# Patient Record
Sex: Male | Born: 1940 | Race: White | Hispanic: No | Smoking: Former smoker
Health system: Southern US, Community
[De-identification: ages and names within clinical notes are randomized; demographics above are authoritative.]

## PROBLEM LIST (undated history)

## (undated) DIAGNOSIS — D649 Anemia, unspecified: Secondary | ICD-10-CM

## (undated) DIAGNOSIS — M1990 Unspecified osteoarthritis, unspecified site: Secondary | ICD-10-CM

## (undated) DIAGNOSIS — G610 Guillain-Barre syndrome: Secondary | ICD-10-CM

## (undated) DIAGNOSIS — K219 Gastro-esophageal reflux disease without esophagitis: Secondary | ICD-10-CM

## (undated) DIAGNOSIS — I714 Abdominal aortic aneurysm, without rupture, unspecified: Secondary | ICD-10-CM

## (undated) DIAGNOSIS — C801 Malignant (primary) neoplasm, unspecified: Secondary | ICD-10-CM

## (undated) DIAGNOSIS — T4145XA Adverse effect of unspecified anesthetic, initial encounter: Secondary | ICD-10-CM

## (undated) DIAGNOSIS — T8859XA Other complications of anesthesia, initial encounter: Secondary | ICD-10-CM

## (undated) DIAGNOSIS — Z5181 Encounter for therapeutic drug level monitoring: Secondary | ICD-10-CM

## (undated) DIAGNOSIS — I080 Rheumatic disorders of both mitral and aortic valves: Secondary | ICD-10-CM

## (undated) DIAGNOSIS — I4891 Unspecified atrial fibrillation: Secondary | ICD-10-CM

## (undated) DIAGNOSIS — I4892 Unspecified atrial flutter: Principal | ICD-10-CM

## (undated) DIAGNOSIS — E119 Type 2 diabetes mellitus without complications: Secondary | ICD-10-CM

## (undated) DIAGNOSIS — Z79899 Other long term (current) drug therapy: Secondary | ICD-10-CM

## (undated) DIAGNOSIS — E785 Hyperlipidemia, unspecified: Secondary | ICD-10-CM

## (undated) HISTORY — DX: Abdominal aortic aneurysm, without rupture: I71.4

## (undated) HISTORY — DX: Abdominal aortic aneurysm, without rupture, unspecified: I71.40

## (undated) HISTORY — PX: BACK SURGERY: SHX140

## (undated) HISTORY — PX: TONSILLECTOMY: SUR1361

## (undated) HISTORY — PX: CHOLECYSTECTOMY: SHX55

## (undated) HISTORY — PX: HERNIA REPAIR: SHX51

## (undated) HISTORY — PX: PROSTATECTOMY: SHX69

## (undated) HISTORY — PX: HIP SURGERY: SHX245

---

## 2001-07-31 ENCOUNTER — Encounter (INDEPENDENT_AMBULATORY_CARE_PROVIDER_SITE_OTHER): Payer: Self-pay

## 2001-07-31 ENCOUNTER — Ambulatory Visit (HOSPITAL_COMMUNITY): Admission: RE | Admit: 2001-07-31 | Discharge: 2001-07-31 | Payer: Self-pay | Admitting: Gastroenterology

## 2002-03-16 ENCOUNTER — Ambulatory Visit (HOSPITAL_COMMUNITY): Admission: RE | Admit: 2002-03-16 | Discharge: 2002-03-16 | Payer: Self-pay | Admitting: Family Medicine

## 2002-03-16 ENCOUNTER — Encounter: Payer: Self-pay | Admitting: Family Medicine

## 2002-03-27 ENCOUNTER — Encounter: Admission: RE | Admit: 2002-03-27 | Discharge: 2002-04-24 | Payer: Self-pay | Admitting: Family Medicine

## 2002-05-08 ENCOUNTER — Encounter: Payer: Self-pay | Admitting: Orthopaedic Surgery

## 2002-05-08 ENCOUNTER — Encounter: Admission: RE | Admit: 2002-05-08 | Discharge: 2002-05-08 | Payer: Self-pay | Admitting: Orthopaedic Surgery

## 2004-07-09 ENCOUNTER — Ambulatory Visit (HOSPITAL_COMMUNITY): Admission: RE | Admit: 2004-07-09 | Discharge: 2004-07-09 | Payer: Self-pay | Admitting: Family Medicine

## 2004-08-17 ENCOUNTER — Ambulatory Visit: Payer: Self-pay | Admitting: Family Medicine

## 2004-08-19 ENCOUNTER — Ambulatory Visit (HOSPITAL_BASED_OUTPATIENT_CLINIC_OR_DEPARTMENT_OTHER): Admission: RE | Admit: 2004-08-19 | Discharge: 2004-08-19 | Payer: Self-pay | Admitting: Surgery

## 2004-10-15 ENCOUNTER — Ambulatory Visit: Payer: Self-pay | Admitting: Family Medicine

## 2004-11-13 ENCOUNTER — Ambulatory Visit: Payer: Self-pay | Admitting: Family Medicine

## 2004-12-07 ENCOUNTER — Ambulatory Visit: Payer: Self-pay | Admitting: Family Medicine

## 2005-04-29 ENCOUNTER — Ambulatory Visit: Payer: Self-pay | Admitting: Family Medicine

## 2005-06-10 ENCOUNTER — Ambulatory Visit: Payer: Self-pay | Admitting: Family Medicine

## 2005-08-17 ENCOUNTER — Ambulatory Visit: Payer: Self-pay | Admitting: Family Medicine

## 2005-10-30 ENCOUNTER — Emergency Department (HOSPITAL_COMMUNITY): Admission: EM | Admit: 2005-10-30 | Discharge: 2005-10-31 | Payer: Self-pay | Admitting: Emergency Medicine

## 2005-12-08 ENCOUNTER — Ambulatory Visit: Payer: Self-pay | Admitting: Family Medicine

## 2007-01-31 ENCOUNTER — Ambulatory Visit: Payer: Self-pay | Admitting: Family Medicine

## 2007-09-01 ENCOUNTER — Ambulatory Visit (HOSPITAL_COMMUNITY): Admission: RE | Admit: 2007-09-01 | Discharge: 2007-09-01 | Payer: Self-pay | Admitting: Urology

## 2007-10-19 ENCOUNTER — Inpatient Hospital Stay (HOSPITAL_COMMUNITY): Admission: RE | Admit: 2007-10-19 | Discharge: 2007-10-20 | Payer: Self-pay | Admitting: Urology

## 2007-10-19 ENCOUNTER — Encounter (INDEPENDENT_AMBULATORY_CARE_PROVIDER_SITE_OTHER): Payer: Self-pay | Admitting: Urology

## 2008-02-26 ENCOUNTER — Observation Stay (HOSPITAL_COMMUNITY): Admission: EM | Admit: 2008-02-26 | Discharge: 2008-02-28 | Payer: Self-pay | Admitting: Emergency Medicine

## 2008-02-26 ENCOUNTER — Ambulatory Visit: Payer: Self-pay | Admitting: Cardiology

## 2008-02-27 ENCOUNTER — Encounter: Payer: Self-pay | Admitting: Cardiology

## 2008-03-07 ENCOUNTER — Ambulatory Visit: Payer: Self-pay

## 2008-03-07 ENCOUNTER — Ambulatory Visit: Payer: Self-pay | Admitting: Internal Medicine

## 2008-03-14 ENCOUNTER — Ambulatory Visit: Payer: Self-pay | Admitting: Internal Medicine

## 2008-04-09 ENCOUNTER — Ambulatory Visit: Payer: Self-pay | Admitting: Internal Medicine

## 2008-06-07 ENCOUNTER — Ambulatory Visit: Payer: Self-pay | Admitting: Internal Medicine

## 2008-10-11 DIAGNOSIS — C801 Malignant (primary) neoplasm, unspecified: Secondary | ICD-10-CM

## 2008-10-11 HISTORY — DX: Malignant (primary) neoplasm, unspecified: C80.1

## 2008-12-18 ENCOUNTER — Encounter: Payer: Self-pay | Admitting: Physician Assistant

## 2008-12-18 ENCOUNTER — Ambulatory Visit: Payer: Self-pay | Admitting: Cardiovascular Disease

## 2008-12-18 ENCOUNTER — Encounter (INDEPENDENT_AMBULATORY_CARE_PROVIDER_SITE_OTHER): Payer: Self-pay | Admitting: *Deleted

## 2008-12-18 DIAGNOSIS — I10 Essential (primary) hypertension: Secondary | ICD-10-CM

## 2008-12-18 DIAGNOSIS — I4891 Unspecified atrial fibrillation: Secondary | ICD-10-CM

## 2008-12-18 DIAGNOSIS — E785 Hyperlipidemia, unspecified: Secondary | ICD-10-CM

## 2008-12-18 DIAGNOSIS — F17200 Nicotine dependence, unspecified, uncomplicated: Secondary | ICD-10-CM | POA: Insufficient documentation

## 2009-03-18 ENCOUNTER — Encounter: Payer: Self-pay | Admitting: Internal Medicine

## 2009-03-19 ENCOUNTER — Telehealth: Payer: Self-pay | Admitting: Internal Medicine

## 2009-04-03 ENCOUNTER — Ambulatory Visit: Payer: Self-pay | Admitting: Internal Medicine

## 2009-04-03 DIAGNOSIS — I714 Abdominal aortic aneurysm, without rupture, unspecified: Secondary | ICD-10-CM | POA: Insufficient documentation

## 2009-04-18 ENCOUNTER — Encounter: Payer: Self-pay | Admitting: Internal Medicine

## 2009-04-18 ENCOUNTER — Ambulatory Visit: Payer: Self-pay

## 2009-09-03 ENCOUNTER — Encounter: Admission: RE | Admit: 2009-09-03 | Discharge: 2009-09-03 | Payer: Self-pay | Admitting: Orthopedic Surgery

## 2009-10-28 ENCOUNTER — Ambulatory Visit: Payer: Self-pay | Admitting: Internal Medicine

## 2009-11-21 ENCOUNTER — Encounter: Payer: Self-pay | Admitting: Internal Medicine

## 2009-11-24 ENCOUNTER — Telehealth: Payer: Self-pay | Admitting: Internal Medicine

## 2009-11-26 ENCOUNTER — Ambulatory Visit: Payer: Self-pay | Admitting: Internal Medicine

## 2009-12-02 ENCOUNTER — Ambulatory Visit (HOSPITAL_BASED_OUTPATIENT_CLINIC_OR_DEPARTMENT_OTHER): Admission: RE | Admit: 2009-12-02 | Discharge: 2009-12-03 | Payer: Self-pay | Admitting: Urology

## 2009-12-09 ENCOUNTER — Encounter: Payer: Self-pay | Admitting: Internal Medicine

## 2009-12-11 ENCOUNTER — Encounter: Payer: Self-pay | Admitting: Internal Medicine

## 2010-01-08 ENCOUNTER — Ambulatory Visit: Payer: Self-pay | Admitting: Internal Medicine

## 2010-04-08 ENCOUNTER — Ambulatory Visit: Payer: Self-pay | Admitting: Internal Medicine

## 2010-06-04 ENCOUNTER — Encounter: Admission: RE | Admit: 2010-06-04 | Discharge: 2010-06-04 | Payer: Self-pay | Admitting: Neurosurgery

## 2010-10-20 ENCOUNTER — Ambulatory Visit
Admission: RE | Admit: 2010-10-20 | Discharge: 2010-10-20 | Payer: Self-pay | Source: Home / Self Care | Attending: Internal Medicine | Admitting: Internal Medicine

## 2010-10-20 ENCOUNTER — Encounter: Payer: Self-pay | Admitting: Internal Medicine

## 2010-11-01 ENCOUNTER — Encounter: Payer: Self-pay | Admitting: Family Medicine

## 2010-11-10 NOTE — Letter (Signed)
Summary: [facility]  [facility]   Imported By: Roderic Ovens 01/19/2010 14:14:05  _____________________________________________________________________  External Attachment:    Type:   Image     Comment:   External Document

## 2010-11-10 NOTE — Assessment & Plan Note (Signed)
Summary: APPT @ 3:15/ROV/PER DR Ladona Ridgel   Visit Type:  rov Primary Provider:  [facility]  CC:  afib.  History of Present Illness: Tyrone Mitchell presents today for preoperative evaluation prior to hydrocele repair.  He reports having symptoms of palpitations while painting on 11/24/09.  He feels certain that this was return of afib.  He has associated SOB but no chest pain.  His symptoms were shortlived.  He reports good exercise tolerance.  He is able to do his own yard work without SOB or CP.  He continues to have occasional afib, despite flecainide.  His episodes are more noticable at night.  He is unaware of any triggers for his afib.  He is otherwise without complaint today.  Current Medications (verified): 1)  Aspirin 81 Mg Tbec (Aspirin) .... Take One Tablet By Mouth Daily 2)  Omeprazole 20 Mg Cpdr (Omeprazole) .... One Daily 3)  Warfarin Sodium 5 Mg Tabs (Warfarin Sodium) .... Use As Directed By Anticoagulation Clinic 4)  Flecainide Acetate 100 Mg Tabs (Flecainide Acetate) .... Take One Tablet By Mouth Every 12 Hours  Allergies: 1)  ! Pcn  Past History:  Past Medical History: TOBACCO ABUSE (ICD-305.1) COUMADIN THERAPY (ICD-V58.61) HYPERTENSION, BENIGN (ICD-401.1) HYPERLIPIDEMIA-MIXED (ICD-272.4) ATRIAL FIBRILLATION (ICD-427.31) GERD PROSTATE CA S/P PROSTATECTOMY OSTEOARTHRITIS  Past Surgical History: Reviewed history from 12/18/2008 and no changes required. Prostatectomy hernia repair  Social History: Reviewed history from 12/18/2008 and no changes required. Retired  Tobacco Use - Yes. 3-4 CIGARS DAILY  Review of Systems       All systems are reviewed and negative except as listed in the HPI.   Vital Signs:  Patient profile:   70 year old male Height:      74 inches Weight:      225 pounds BMI:     28.99 Pulse rate:   67 / minute Pulse rhythm:   irregular BP sitting:   118 / 80  (left arm) Cuff size:   regular  Vitals Entered By: Danielle Rankin, CMA (November 26, 2009 3:19 PM)  Physical Exam  General:  Well developed, well nourished, in no acute distress. Head:  normocephalic and atraumatic Eyes:  PERRLA/EOM intact; conjunctiva and lids normal. Mouth:  Teeth, gums and palate normal. Oral mucosa normal. Neck:  Neck supple, no JVD. No masses, thyromegaly or abnormal cervical nodes. Lungs:  Clear bilaterally to auscultation with no wheezes, rales, or rhonchi. Heart:  RRR with normal S1 and S2.  No murmurs, rubs or gallops.  PMI was not enlarged or laterally displaced. Abdomen:  Bowel sounds positive; abdomen soft and non-tender without masses, organomegaly, or hernias noted. No hepatosplenomegaly. Msk:  Back normal, normal gait. Muscle strength and tone normal. Pulses:  pulses normal in all 4 extremities Extremities:  No clubbing or cyanosis. Neurologic:  Alert and oriented x 3. Skin:  Intact without lesions or rashes. Cervical Nodes:  no significant adenopathy Psych:  Normal affect.   Exercise Stress Test  Procedure date:  04/09/2008  Findings:      RESULTS:  This demonstrates a clinically and electrically negative   exercise treadmill test with no inducible arrhythmias in the patient on   flecainide for atrial fibrillation.         Nuclear ETT  Procedure date:  03/08/2008  Findings:      probable normal perfusion, and minimal soft tissue attenuation. No significant defect scar or ischemia. Low risk study not gated due to atrial fibrillation.    EKG  Procedure date:  11/26/2009  Findings:      sinus rhythm 67 bpm, otherwise normal ekg  Impression & Recommendations:  Problem # 1:  PREOPERATIVE EXAMINATION (ICD-V72.84) Pt presents for preoperative clearance prior to hydrocele repair.  He has no symptoms of ischemia and is able to exercise at above 10 mets.  His had a low risk myoview in 2009 and denies changes in symptoms since that time. He continues to have stable paroxysmal atrial fibrillation. I would therefore recommend  that he proceed to surgery if necessary with low risks.  Continue perioperative beta blockade.  Hold coumadin for 5 days prior to the procedure and resume coumadin immediately afterwards.  Problem # 2:  ATRIAL FIBRILLATION (ICD-427.31) stable continue flecainide and coumadin if worsening afib, we could consider ablation  His updated medication list for this problem includes:    Aspirin 81 Mg Tbec (Aspirin) .Marland Kitchen... Take one tablet by mouth daily    Warfarin Sodium 5 Mg Tabs (Warfarin sodium) ..... Use as directed by anticoagulation clinic    Flecainide Acetate 100 Mg Tabs (Flecainide acetate) .Marland Kitchen... Take one tablet by mouth every 12 hours  Patient Instructions: 1)  follow-up with Dr Ladona Ridgel as previously scheduled.

## 2010-11-10 NOTE — Assessment & Plan Note (Signed)
Summary: 3-4 [email or web address]   Primary Provider:  [facility]   History of Present Illness: Tyrone Mitchell returns today for followup of atrial fibrillation and HTN.  He has a h/o recurrent atrial fib and has for the most part maintained NSR on flecainide.  The patient has done well until I saw him several months ago when he noted increasing episodes of atrial fib which occured most frequently in the evening.  At that time I recommended that he taking an additional flecainide on a as needed basis and he notes that since then, he has maintained NSR very nicely.  The patient notes that he has had to take an additional flecainide only 3-4 times in the past 3 months.   Current Medications (verified): 1)  Aspirin 81 Mg Tbec (Aspirin) .... Take One Tablet By Mouth Daily 2)  Omeprazole 20 Mg Cpdr (Omeprazole) .... One Daily 3)  Warfarin Sodium 5 Mg Tabs (Warfarin Sodium) .... Use As Directed By Anticoagulation Clinic 5 Mg Sat, Sun, Tues and Wed.  Other Days Take 2.5 Mg 4)  Flecainide Acetate 100 Mg Tabs (Flecainide Acetate) .... Take One Tablet By Mouth Every 12 Hours 5)  Celebrex 200 Mg Caps (Celecoxib) .... Take One Capsule Once Daily  Allergies (verified): 1)  ! Pcn  Past History:  Past Medical History: Last updated: 11/26/2009 TOBACCO ABUSE (ICD-305.1) COUMADIN THERAPY (ICD-V58.61) HYPERTENSION, BENIGN (ICD-401.1) HYPERLIPIDEMIA-MIXED (ICD-272.4) ATRIAL FIBRILLATION (ICD-427.31) GERD PROSTATE CA S/P PROSTATECTOMY OSTEOARTHRITIS  Past Surgical History: Last updated: 2009-01-09 Prostatectomy hernia repair  Family History: Last updated: 2009-01-09 Father:DIED CA BUT HAD ANGINA Mother:DIED 89 DEMENTIA BROTHER CAD  Social History: Last updated: 01-09-2009 Retired  Tobacco Use - Yes. 3-4 CIGARS DAILY  Review of Systems  The patient denies chest pain, syncope, dyspnea on exertion, and peripheral edema.    Vital Signs:  Patient profile:   70 year old male Height:      74  inches Weight:      217 pounds BMI:     27.96 Pulse rate:   69 / minute Pulse rhythm:   regular BP sitting:   114 / 66  (left arm) Cuff size:   large  Vitals Entered By: Judithe Modest CMA (April 08, 2010 2:11 PM)  Physical Exam  General:  Well developed, well nourished, in no acute distress. Head:  normocephalic and atraumatic Eyes:  PERRLA/EOM intact; conjunctiva and lids normal. Mouth:  Teeth, gums and palate normal. Oral mucosa normal. Neck:  Neck supple, no JVD. No masses, thyromegaly or abnormal cervical nodes. Lungs:  Clear bilaterally to auscultation with no wheezes, rales, or rhonchi. Heart:  RRR with normal S1 and S2.  No murmurs, rubs or gallops.  PMI was not enlarged or laterally displaced. Abdomen:  Bowel sounds positive; abdomen soft and non-tender without masses, organomegaly, or hernias noted. No hepatosplenomegaly. Msk:  Back normal, normal gait. Muscle strength and tone normal. Pulses:  pulses normal in all 4 extremities Extremities:  No clubbing or cyanosis. Neurologic:  Alert and oriented x 3.   EKG  Procedure date:  04/08/2010  Findings:      Normal sinus rhythm with rate of: 69. PAC's noted.    Impression & Recommendations:  Problem # 1:  ATRIAL FIBRILLATION (ICD-427.31) His symptoms remain well controlled on medical therapy.  Will continue. His updated medication list for this problem includes:    Aspirin 81 Mg Tbec (Aspirin) .Marland Kitchen... Take one tablet by mouth daily    Warfarin Sodium 5 Mg Tabs (Warfarin sodium) ..... Use as  directed by anticoagulation clinic 5 mg sat, sun, tues and wed.  other days take 2.5 mg    Flecainide Acetate 100 Mg Tabs (Flecainide acetate) .Marland Kitchen... Take one tablet by mouth every 12 hours  Orders: EKG w/ Interpretation (93000)  Problem # 2:  TOBACCO ABUSE (ICD-305.1) I continue to encourage him on smoking cessation.  He will try to improve.  Problem # 3:  HYPERTENSION, BENIGN (ICD-401.1) His blood pressure remains well  controlled.  Will followup and he will maintain a low sodium diet. His updated medication list for this problem includes:    Aspirin 81 Mg Tbec (Aspirin) .Marland Kitchen... Take one tablet by mouth daily  Patient Instructions: 1)  Your physician wants you to follow-up in: 6 months with Dr Ladona Ridgel.  You will receive a reminder letter in the mail two months in advance. If you don't receive a letter, please call our office to schedule the follow-up appointment. 2)  Your physician recommends that you continue on your current medications as directed. Please refer to the Current Medication list given to you today.

## 2010-11-10 NOTE — Progress Notes (Signed)
Summary: palps. needs clearance  Phone Note From Other Clinic   Caller: nurse tammy Summary of Call: Pt scheduled to have a procedure on the 22nd. Hydrocelerepair. Pt came into their ofc on Friday c/o chest pain. Wants pt seen before 22nd. Dr Ladona Ridgel doesnt have anything. ofc [id number] Initial call taken by: Edman Circle,  November 24, 2009 9:07 AM  Follow-up for Phone Call        tried to call Tammy  the office is experiencing high call vol and and I could not continue to hold. will try back later Dennis Bast, RN, BSN  November 24, 2009 11:39 AM tried to call office again ...no answer.. phone rang over and over no voice mail with option 4. Dennis Bast, RN, BSN  November 24, 2009 12:35 PM finally was able to get through to office and speek to [place].  Let her know that Dr Ladona Ridgel does not have any openings before the 22nd. Per Dr Ladona Ridgel will have to be seen by another one of our Cardiologist.  She spoke with Dr Lysbeth Galas and he is ok with that. we will call pt and schedule appointment. Dennis Bast, RN, BSN  November 24, 2009 2:34 PM

## 2010-11-10 NOTE — Assessment & Plan Note (Signed)
Summary: rov/afib   Visit Type:  Initial Consult Primary Provider:  [facility]   History of Present Illness: [name] returns today for followup of atrial fibrillation and HTN.  He has a h/o recurrent atrial fib and has for the most part maintained NSR on flecainide.  The patient has done well until several weeks ago when he noted increasing episodes of atrial fib which occured most frequently in the evening.  He has subsequently undergone repair of a hydrocele.    Current Medications (verified): 1)  Aspirin 81 Mg Tbec (Aspirin) .... Take One Tablet By Mouth Daily 2)  Omeprazole 20 Mg Cpdr (Omeprazole) .... One Daily 3)  Warfarin Sodium 5 Mg Tabs (Warfarin Sodium) .... Use As Directed By Anticoagulation Clinic 4)  Flecainide Acetate 100 Mg Tabs (Flecainide Acetate) .... Take One Tablet By Mouth Every 12 Hours 5)  Glucosamine Sulfate   Powd (Glucosamine Sulfate) .... Once Daily  Allergies: 1)  ! Pcn  Past History:  Past Medical History: Last updated: 11/26/2009 TOBACCO ABUSE (ICD-305.1) COUMADIN THERAPY (ICD-V58.61) HYPERTENSION, BENIGN (ICD-401.1) HYPERLIPIDEMIA-MIXED (ICD-272.4) ATRIAL FIBRILLATION (ICD-427.31) GERD PROSTATE CA S/P PROSTATECTOMY OSTEOARTHRITIS  Past Surgical History: Last updated: 12/18/2008 Prostatectomy hernia repair  Review of Systems  The patient denies chest pain, syncope, dyspnea on exertion, and peripheral edema.    Vital Signs:  Patient profile:   70 year old male Height:      74 inches Weight:      223 pounds BMI:     28.73 Pulse rate:   62 / minute BP sitting:   110 / 70  (left arm)  Vitals Entered By: Laurance Flatten CMA (January 08, 2010 11:59 AM)  Physical Exam  General:  Well developed, well nourished, in no acute distress. Head:  normocephalic and atraumatic Eyes:  PERRLA/EOM intact; conjunctiva and lids normal. Mouth:  Teeth, gums and palate normal. Oral mucosa normal. Neck:  Neck supple, no JVD. No masses, thyromegaly or abnormal  cervical nodes. Lungs:  Clear bilaterally to auscultation with no wheezes, rales, or rhonchi. Heart:  RRR with normal S1 and S2.  No murmurs, rubs or gallops.  PMI was not enlarged or laterally displaced. Abdomen:  Bowel sounds positive; abdomen soft and non-tender without masses, organomegaly, or hernias noted. No hepatosplenomegaly. Msk:  Back normal, normal gait. Muscle strength and tone normal. Pulses:  pulses normal in all 4 extremities Extremities:  No clubbing or cyanosis. Neurologic:  Alert and oriented x 3.   EKG  Procedure date:  01/08/2010  Findings:      Normal sinus rhythm with rate of: 62. PAC's noted.    Impression & Recommendations:  Problem # 1:  ATRIAL FIBRILLATION (ICD-427.31) He seems to have more atrial fibrillation and I have asked him to take additional flecainide on days that his symptoms are worse.  He may also require as needed beta blockers. His updated medication list for this problem includes:    Aspirin 81 Mg Tbec (Aspirin) .Marland Kitchen... Take one tablet by mouth daily    Warfarin Sodium 5 Mg Tabs (Warfarin sodium) ..... Use as directed by anticoagulation clinic    Flecainide Acetate 100 Mg Tabs (Flecainide acetate) .Marland Kitchen... Take one tablet by mouth every 12 hours  Orders: EKG w/ Interpretation (93000)  Problem # 2:  HYPERTENSION, BENIGN (ICD-401.1) His blood pressure has been well controlled on dietary management.  A low sodium diet is requested. His updated medication list for this problem includes:    Aspirin 81 Mg Tbec (Aspirin) .Marland Kitchen... Take one tablet  by mouth daily  Patient Instructions: 1)  Your physician recommends that you schedule a follow-up appointment in: 3-4 months with Dr Ladona Ridgel

## 2010-11-10 NOTE — Consult Note (Signed)
[facility]   Imported By: Marylou Mccoy 11/25/2009 10:29:33  _____________________________________________________________________  External Attachment:    Type:   Image     Comment:   External Document

## 2010-11-10 NOTE — Assessment & Plan Note (Signed)
Summary: per check out   Visit Type:  Follow-up Primary Provider:  [facility]   History of Present Illness: [name] refers today for followup.  He is a 70 yo man with a h/o atrial fibrillation who was placed on flecainide.  He has maintained NSR since.  He returns today and notes that he feels well.  He denies c/p or sob and his energy level has returned to normal.  No syncope or abdominal pain.  He has been intolerant of beta blockers experiencing bradycardia and hypotension.  Current Medications (verified): 1)  Aspirin 81 Mg Tbec (Aspirin) .... Take One Tablet By Mouth Daily 2)  Omeprazole 20 Mg Cpdr (Omeprazole) .... One Daily 3)  Warfarin Sodium 5 Mg Tabs (Warfarin Sodium) .... Use As Directed By Anticoagulation Clinic 4)  Flecainide Acetate 100 Mg Tabs (Flecainide Acetate) .... Take One Tablet By Mouth Every 12 Hours  Allergies: 1)  ! Pcn  Past History:  Past Medical History: Last updated: 12/18/2008 Current Problems:  TOBACCO ABUSE (ICD-305.1) COUMADIN THERAPY (ICD-V58.61) HYPERTENSION, BENIGN (ICD-401.1) HYPERLIPIDEMIA-MIXED (ICD-272.4) ATRIAL FIBRILLATION (ICD-427.31) GERD PROSTATE CA S/P PROSTATECTOMY OSTEOARTHRITIS  Past Surgical History: Last updated: 12/18/2008 Prostatectomy hernia repair  Review of Systems  The patient denies chest pain, syncope, dyspnea on exertion, and peripheral edema.    Vital Signs:  Patient profile:   70 year old male Height:      74 inches Weight:      227 pounds BMI:     29.25 Pulse rate:   70 / minute BP sitting:   130 / 80  (left arm)  Vitals Entered By: Laurance Flatten CMA (October 28, 2009 12:37 PM)  Physical Exam  General:  Well developed, well nourished, in no acute distress. Head:  normocephalic and atraumatic Eyes:  PERRLA/EOM intact; conjunctiva and lids normal. Mouth:  Teeth, gums and palate normal. Oral mucosa normal. Neck:  Neck supple, no JVD. No masses, thyromegaly or abnormal cervical nodes. Lungs:  Clear  bilaterally to auscultation with no wheezes, rales, or rhonchi. Heart:  RRR with normal S1 and S2.  No murmurs, rubs or gallops.  PMI was not enlarged or laterally displaced. Abdomen:  Bowel sounds positive; abdomen soft and non-tender without masses, organomegaly, or hernias noted. No hepatosplenomegaly.  I could not appreciate an aneurysm by palpitation. Msk:  Back normal, normal gait. Muscle strength and tone normal. Pulses:  pulses normal in all 4 extremities Extremities:  No clubbing or cyanosis. Neurologic:  Alert and oriented x 3.   EKG  Procedure date:  10/28/2009  Findings:      Normal sinus rhythm with rate of:  70.  Impression & Recommendations:  Problem # 1:  ATRIAL FIBRILLATION (ICD-427.31) His atrial fibrillation remains nicely controlled on flecainide.  His ECG shows a normal QRS.  Will recheck in one year. His updated medication list for this problem includes:    Aspirin 81 Mg Tbec (Aspirin) .Marland Kitchen... Take one tablet by mouth daily    Warfarin Sodium 5 Mg Tabs (Warfarin sodium) ..... Use as directed by anticoagulation clinic    Flecainide Acetate 100 Mg Tabs (Flecainide acetate) .Marland Kitchen... Take one tablet by mouth every 12 hours  Orders: EKG w/ Interpretation (93000)  Problem # 2:  HYPERTENSION, BENIGN (ICD-401.1) He remains normotensive on no medical therapy and was hypotensive on beta blockers. His updated medication list for this problem includes:    Aspirin 81 Mg Tbec (Aspirin) .Marland Kitchen... Take one tablet by mouth daily  Patient Instructions: 1)  Your physician wants  you to follow-up in: 1 year.You will receive a reminder letter in the mail two months in advance. If you don't receive a letter, please call our office to schedule the follow-up appointment.

## 2010-11-12 NOTE — Assessment & Plan Note (Signed)
Summary: f1y   Visit Type:  Follow-up Primary Provider:  [facility]   History of Present Illness: Tyrone Mitchell returns today for followup of atrial fibrillation.  He has a h/o recurrent atrial fib and has for the most part maintained NSR on flecainide.  The patient has done well from an arrhythmia standpoint.  He has recently undergone back surgery.  He thinks he has had only one prolonged episode of back pain in the past 3 months.  Current Medications (verified): 1)  Aspirin 81 Mg Tbec (Aspirin) .... Take One Tablet By Mouth Daily 2)  Omeprazole 20 Mg Cpdr (Omeprazole) .... One Daily 3)  Warfarin Sodium 5 Mg Tabs (Warfarin Sodium) .... Use As Directed By Anticoagulation Clinic 5 Mg Sat, Sun, Tues and Wed.  Other Days Take 2.5 Mg 4)  Flecainide Acetate 100 Mg Tabs (Flecainide Acetate) .... Take One Tablet By Mouth Every 12 Hours 5)  Percocet .... Uad  Allergies (verified): 1)  ! Pcn  Past History:  Past Medical History: Last updated: 11/26/2009 TOBACCO ABUSE (ICD-305.1) COUMADIN THERAPY (ICD-V58.61) HYPERTENSION, BENIGN (ICD-401.1) HYPERLIPIDEMIA-MIXED (ICD-272.4) ATRIAL FIBRILLATION (ICD-427.31) GERD PROSTATE CA S/P PROSTATECTOMY OSTEOARTHRITIS  Past Surgical History: Last updated: 12/18/2008 Prostatectomy hernia repair  Review of Systems  The patient denies chest pain, syncope, dyspnea on exertion, and peripheral edema.    Vital Signs:  Patient profile:   70 year old male Height:      74 inches Weight:      214 pounds BMI:     27.58 Pulse rate:   70 / minute BP sitting:   110 / 70  (left arm)  Vitals Entered By: Laurance Flatten CMA (October 20, 2010 9:27 AM)  Physical Exam  General:  Well developed, well nourished, in no acute distress. Head:  normocephalic and atraumatic Eyes:  PERRLA/EOM intact; conjunctiva and lids normal. Mouth:  Teeth, gums and palate normal. Oral mucosa normal. Neck:  Neck supple, no JVD. No masses, thyromegaly or abnormal cervical  nodes. Lungs:  Clear bilaterally to auscultation with no wheezes, rales, or rhonchi. Heart:  RRR with normal S1 and S2.  No murmurs, rubs or gallops.  PMI was not enlarged or laterally displaced. Abdomen:  Bowel sounds positive; abdomen soft and non-tender without masses, organomegaly, or hernias noted. No hepatosplenomegaly. Msk:  Back normal, normal gait. Muscle strength and tone normal. Pulses:  pulses normal in all 4 extremities Extremities:  No clubbing or cyanosis. Neurologic:  Alert and oriented x 3.   EKG  Procedure date:  10/20/2010  Findings:      Normal sinus rhythm with rate of:  70.  Impression & Recommendations:  Problem # 1:  ATRIAL FIBRILLATION (ICD-427.31) His symptoms are well controlled. He will continue his current meds. His updated medication list for this problem includes:    Aspirin 81 Mg Tbec (Aspirin) .Marland Kitchen... Take one tablet by mouth daily    Warfarin Sodium 5 Mg Tabs (Warfarin sodium) ..... Use as directed by anticoagulation clinic 5 mg sat, sun, tues and wed.  other days take 2.5 mg    Flecainide Acetate 100 Mg Tabs (Flecainide acetate) .Marland Kitchen... Take one tablet by mouth every 12 hours  Patient Instructions: 1)  Your physician wants you to follow-up in: 12months with Dr Court Joy will receive a reminder letter in the mail two months in advance. If you don't receive a letter, please call our office to schedule the follow-up appointment.

## 2010-12-30 LAB — APTT: aPTT: 26 seconds (ref 24–37)

## 2010-12-30 LAB — POCT HEMOGLOBIN-HEMACUE: Hemoglobin: 14.9 g/dL (ref 13.0–17.0)

## 2011-01-27 ENCOUNTER — Other Ambulatory Visit: Payer: Self-pay | Admitting: Internal Medicine

## 2011-01-28 ENCOUNTER — Other Ambulatory Visit: Payer: Self-pay | Admitting: *Deleted

## 2011-01-28 MED ORDER — FLECAINIDE ACETATE 100 MG PO TABS: 100.0000 mg | ORAL_TABLET | Freq: Two times a day (BID) | ORAL | Status: DC

## 2011-02-23 NOTE — H&P (Signed)
NAME:  Tyrone Mitchell, Tyrone Mitchell NO.:  [id number]   MEDICAL RECORD NO.:  [id number]          PATIENT TYPE:  OBV   LOCATION:  1424                         FACILITY:  [facility]   PHYSICIAN:  Gerrit Friends. Dietrich Pates, MD, FACCDATE OF BIRTH:  June 25, 1941   DATE OF ADMISSION:  02/26/2008  DATE OF DISCHARGE:                              HISTORY & PHYSICAL   PRIMARY CARE PHYSICIAN:  Delaney Meigs, MD.   PRIMARY CARDIOLOGIST:  Will be Dr. Doylene Canning. Ladona Ridgel, has an appointment,  has not yet seen.   CHIEF COMPLAINT:  Palpitations and shortness of breath.   HISTORY OF PRESENT ILLNESS:  Mr. Kuzniar is a 70 year old male with no  history of coronary artery disease.  He has had palpitations that have  increased in frequency over the last 4 weeks.  He has also noted dyspnea  on exertion and an occasional right shooting chest pain.  The chest pain  is very brief.  He denies edema but has had orthopnea.  He has not had  PND.  His palpitations are worse at night and first thing in the  morning.  After he takes his a.m. medications, his symptoms improve by  about lunchtime.  Today, his symptoms were worse and his weakness was  worse.  He also felt like his dyspnea was worse.  He came to the  hospital and he was found to be in atrial fibrillation with some volume  overload.  His heart rate is currently controlled and he is resting  comfortably on O2.   PAST MEDICAL HISTORY:  1. Paroxysmal atrial fibrillation diagnosed 5 years ago in [place],      [place], at [facility].  2. Hyperlipidemia.  3. Tobacco use.  4. Chronic anticoagulation with Coumadin.  5. History of prostate cancer with no atrial fibrillation at the time      of his prostatectomy.  6. Osteoarthritis.  7. Gastroesophageal reflux.   PAST SURGICAL HISTORY:  1. Status post prostatectomy in January of 2009.  2. Remote hernia repair.   ALLERGIES:  PENICILLIN.   CURRENT MEDICATIONS:  1. Aspirin 81 mg a day.  2. Atenolol 25 mg a  day.  3. Diltiazem 90 mg a day.  4. Lorazepam 0.5 mg p.r.n.  5. Naprosyn 500 mg b.i.d.  6. Niacin 1000 mg a day.  7. Prilosec 20 mg a day.  8. Ditropan 5 mg a day.  9. Coumadin 5 mg daily except for 2.5 mg on Saturday.   SOCIAL HISTORY:  He lives in [place] alone and is retired from [employer].  He used to smoke cigarettes and smokes 3 to 4 small cigars  daily.  He denies alcohol or drug abuse.   FAMILY HISTORY:  His mother died at age 72 and his father died at age  71.  His mother died with dementia, but no heart disease, and his father  died of cancer but had a history of angina.  He has 1 brother with no  CAD.   REVIEW OF SYSTEMS:  He has some mild hearing and vision loss.  The  shortness of breath and  chest pain are described above.  He has  occasional wheezing.  He has had some anxiety because of the cancer.  He  has some problems with frequent urination and nocturia.  He has some  chronic arthralgias and reflux symptoms, but no hematemesis or melena.  A full 14-point review of systems is otherwise negative.   PHYSICAL EXAMINATION:  VITAL SIGNS:  Blood pressure 117/80, heart rate  66, respiratory rate 12, O2 saturation 97% on 3 L.  GENERAL:  He is a well-developed, well-nourished, white male in no acute  distress.  HEENT:  Normal.  NECK:  There is no lymphadenopathy, thyromegaly, or bruit noted.  He has  mild JVD.  CV:  His heart is irregular in rate and rhythm with an S1 S2, and a  faint systolic murmur is noted.  Distal pulses are intact in all 4  extremities.  LUNGS:  He has decreased breath sounds in the bases with a few rales,  but no crackles are noted.  SKIN:  No rashes or lesions are noted.  ABDOMEN:  Soft and nontender with active bowel sounds.  EXTREMITIES:  There is no cyanosis, clubbing, or edema noted.  MUSCULOSKELETAL:  There is no joint deformity or effusions, and no spine  or CVA tenderness.  NEURO:  He is alert and oriented.  Cranial nerves II-XII  grossly intact.   CHEST X-RAY:  Mild interstitial pulmonary edema is noted with a small  right effusion.  A prior chest x-ray in 2006 showed increased markings,  which are now slightly worse with Charyl Dancer B lines noted.   ELECTROCARDIOGRAM:  Atrial fibrillation, rate 73, with no acute ischemic  changes and possibly early repo.   Laboratory values are pending, but INR is 2.3 and point-of-care markers  are negative x1, BNP 210.   IMPRESSION:  Atrial fibrillation.  He needs better rate control as his  palpitations are more rapid and more noticeable in the evenings and  first thing in the morning before he takes his medicines.  We will  increase his Cardizem to 30 mg p.o. q.6 hours and change to Cardizem-CD  at 120 in a.m. if he tolerates it well.  We will also increase his  atenolol to 25 mg p.o. b.i.d.  We will check an echocardiogram as well  as a TSH.  A D-dimer will be checked as well.  His Coumadin is  therapeutic and will be continued.  He will be continued on his other  home  medications with a lipid profile and a hemoglobin A1c check as well.  He  received intravenous Lasix in the emergency room and diuresed well with  this.  We will switch him to p.o. Lasix in a.m. and follow his volume  status closely.  Further evaluation and treatment will depend on the  results of the above testing.      Theodore Demark, PA-C      Gerrit Friends. Dietrich Pates, MD, [facility]  Electronically Signed    RB/MEDQ  D:  02/26/2008  T:  02/26/2008  Job:  [id number]   cc:   Delaney Meigs, M.D.  Fax: [phone]

## 2011-02-23 NOTE — Procedures (Signed)
[facility]                              EXERCISE TREADMILL   NAME:Tyrone Mitchell, Tyrone Mitchell                    MRN:          [id number]  DATE:04/09/2008                            DOB:          12-24-1940    The patient is a very pleasant 70 year old male with atrial fibrillation  who was placed on flecainide.  He is here now for exercise treadmill  test to rule out for arrhythmia on flecainide.   PROCEDURE:  After informed consent was obtained, the patient was taken  to the treadmill area where he was prepped in the usual manner.  His  initial rhythm was sinus bradycardia at 49 beats per minute.  He walked  on a Bruce protocol for a total of 7 minutes reaching 8-1/2 METs.  His  heart rate increased up to a maximum of 116 beats per minute, which  represented approximately 90% of predicted.  The test was stopped  secondary to patient fatigue.  There was no chest pain or shortness of  breath.  Review of the EKG segments demonstrated no diagnostic ST-T  changes for ischemia.  In recovery, there were no significant  arrhythmias though he did have occasional PACs.   COMPLICATIONS:  There were no immediate procedure complications.   RESULTS:  This demonstrates a clinically and electrically negative  exercise treadmill test with no inducible arrhythmias in the patient on  flecainide for atrial fibrillation.     Doylene Canning. Ladona Ridgel, MD  Electronically Signed    GWT/MedQ  DD: 04/09/2008  DT: 04/10/2008  Job #: [id number]   cc:   Delaney Meigs, M.D.

## 2011-02-23 NOTE — Assessment & Plan Note (Signed)
[facility]                         ELECTROPHYSIOLOGY OFFICE NOTE   NAME:Tyrone Mitchell, Tyrone Mitchell                    MRN:          [id number]  DATE:06/07/2008                            DOB:          [birth year]    Tyrone Mitchell returns today for followup.  I saw him back in May 2009, for  atrial fibrillation, and when he was in our office, he was actually in  AFib.  He had been placed on beta-blockers and calcium channel blockers,  but despite these as well as digoxin, he maintained AFib.  We  subsequently started him on flecainide and discontinued his Cardizem,  and he spontaneously reverted back to sinus rhythm.  He returns today  for followup.  The patient has otherwise been stable.  He denies chest  pain.  He denies palpitations.  He denies shortness of breath.  He is  tolerating his flecainide very nicely.   MEDICATIONS:  1. Niacin 500 two tablets daily.  2. Aspirin 81 mg daily.  3. Omeprazole 20 mg daily.  4. Warfarin as directed.  5. Naprosyn p.r.n.  6. Potassium 20 mEq daily.  7. Digoxin 0.125 mg daily.  8. Flecainide 100 mg twice daily.  9. Atenolol 25 twice daily.  10.Furosemide 20 mg p.r.n. daily.   PHYSICAL EXAMINATION:  GENERAL:  He is a pleasant well-appearing, middle-  aged man in no distress.  VITAL SIGNS:  Blood pressure was 110/80, the pulse 66 and regular,  respirations were 18, the weight was 221 pounds.  NECK:  No jugular venous distention.  LUNGS:  Clear bilaterally to auscultation.  No wheezes, rales, or  rhonchi are present.  CARDIOVASCULAR:  Regular rate and rhythm.  Normal S1 and S2.  EXTREMITIES:  No edema.   IMPRESSION:  1. Paroxysmal/persistent atrial fibrillation, now maintaining normal      sinus rhythm on flecainide.  2. Hypertension, his blood pressure is well controlled.   DISCUSSION:  Tyrone Mitchell is stable.  His AFib is maintained.  He has  been maintaining sinus rhythm very nicely on flecainide.  I will plan  to  see the patient back in the office in several months.     Doylene Canning. Ladona Ridgel, MD  Electronically Signed    GWT/MedQ  DD: 06/07/2008  DT: 06/08/2008  Job #: [id number]   cc:   Delaney Meigs, M.D.

## 2011-02-23 NOTE — Discharge Summary (Signed)
NAME:  Tyrone Mitchell, Tyrone Mitchell NO.:  [id number]   MEDICAL RECORD NO.:  [id number]          PATIENT TYPE:  OBV   LOCATION:  1424                         FACILITY:  [facility]   PHYSICIAN:  Rollene Rotunda, MD, FACCDATE OF BIRTH:  1940-12-15   DATE OF ADMISSION:  02/26/2008  DATE OF DISCHARGE:  02/28/2008                               DISCHARGE SUMMARY   REASON FOR ADMISSION:  Palpitations, shortness of breath.   DISCHARGE DIAGNOSES:  1. Atrial fibrillation.  2. Congestive heart failure a well-preserved ejection fraction.   PROCEDURE:  Echocardiogram.   HISTORY OF PRESENT ILLNESS:  The patient was admitted on 02/26/2008,  with increasing palpitations.  He had, had a history of paroxysmal  atrial fibrillation.  He came to the emergency room when the  palpitations became more symptomatic.  He was also somewhat short of  breath.  His chest x-ray demonstrated some mild pulmonary edema.  His  atrial fibrillation rate was in the 130s.  His labs were unremarkable,  except for a slightly elevated BNP of 210 and an INR 2.3.  Point care  markers were negative.   The patient was admitted to the hospital and treated with an increased  dose of oral Cardizem and atenolol.  Digoxin was also added to his  regimen.  An echocardiogram demonstrated an ejection fraction of 55%.  There were no wall motion abnormalities.  There was mild to moderate  mitral regurgitation.  The left atrial size with moderately dilated.  The patient did rule out for myocardial infarction.  His breathing was  improved with Lasix.  He maintained a therapeutic Coumadin level.  His  TSH was normal.  D-dimer was less than 0.22.   On the day of discharge, the patient's heart rate on telemetry was in  the 90s, and still in atrial fibrillation.  He was not having further  palpitations.  His breathing was much improved.  He had complained of  some intermittent chest discomfort before admission, but was having none  of this  at the time of discharge.  This was usually when he was having  his most rapid rate.   LABORATORY DATA:  PT (at discharge) 2.6, sodium 139, potassium 3.9, BUN  16, creatinine 1.2, TSH 0.943, hemoglobin A1c 6.1, total cholesterol  186, triglycerides 176, LDL 123, HDL 28, BNP 210.  Chest x-ray (Feb 27, 2008), stable, mild congestive heart failure.   DISCHARGE MEDICATIONS:  1. Diltiazem CD 120 mg daily (increased and consolidated).  2. Atenolol 25 mg b.i.d. (dose increased).  3. Lasix 20 mg daily (new drug).  4. Potassium 10 mEq daily (new drug).  5. Digoxin 0.125 mg daily (new drug).  6. Coumadin (followed at the [facility] for INRs).  7. Aspirin 81 mg daily.  8. Oxybutynin.  9. Prilosec 20 mg daily.  10.Niacin 1000 mg daily.  11.Naproxen 500 mg b.i.d.  12.Lorazepam 0.5 mg as needed.   FOLLOW-UP PLANS:  1. The patient should follow with Dr. Lysbeth Galas for management of his      dyslipidemia and borderline blood sugars.  2. The patient has a follow-up appointment with Dr.  [name] on      Mar 07, 2008, at 1:30 at our [facility].  This was a      previously established appointment with Dr. Ladona Ridgel as a new      patient.  Since this is an A-fib problem and the patient has many      questions, we will go ahead and keep this appointment.  3. The patient to evaluate his chest pain will be scheduled for a      nuclear stress test with exercise.  This is scheduled for 9:45, and      the patient is instructed to show up at 9:30 at the [facility] on Mar 07, 2008, the day of his appointment with Dr. Ladona Ridgel.      He is given all instructions.  He will continue his atenolol and      Cardizem as I am sure his reach target heart rate that day.  4. The patient should have a BMET on the day of that appointment.      Rollene Rotunda, MD, [facility]  Electronically Signed     JH/MEDQ  D:  02/28/2008  T:  02/28/2008  Job:  [id number]   cc:   Delaney Meigs, M.D.  Fax: [phone]

## 2011-02-23 NOTE — Discharge Summary (Signed)
NAME:  LANNY, LIPKIN NO.:  [id number]   MEDICAL RECORD NO.:  [id number]          PATIENT TYPE:  OBV   LOCATION:  1424                         FACILITY:  [facility]   PHYSICIAN:  Rollene Rotunda, MD, FACCDATE OF BIRTH:  24-Nov-1940   DATE OF ADMISSION:  02/26/2008  DATE OF DISCHARGE:                               DISCHARGE SUMMARY   ADDENDUM:   Time of discharge greater than one-half hour physician time.      Rollene Rotunda, MD, [facility]  Electronically Signed     JH/MEDQ  D:  02/28/2008  T:  02/28/2008  Job:  [id number]

## 2011-02-23 NOTE — Discharge Summary (Signed)
NAME:  Tyrone Mitchell, Tyrone Mitchell NO.:  [id number]   MEDICAL RECORD NO.:  [id number]          PATIENT TYPE:  INP   LOCATION:  1433                         FACILITY:  [facility]   PHYSICIAN:  Heloise Purpura, MD      DATE OF BIRTH:  Mar 09, 1941   DATE OF ADMISSION:  10/19/2007  DATE OF DISCHARGE:  10/20/2007                               DISCHARGE SUMMARY   ADMISSION DIAGNOSIS:  Prostate cancer.   DISCHARGE DIAGNOSIS:  Prostate cancer.   HISTORY AND PHYSICAL:  For full details, please see admission history  and physical.  Briefly, [name] is a 70 year old gentleman with  clinically localized adenocarcinoma of the prostate.  After discussing  management options, he elected to proceed with surgical therapy and a  robotic prostatectomy.   HOSPITAL COURSE:  On October 19, 2007, the patient was taken to the  operating room and underwent a robotic-assisted laparoscopic radical  prostatectomy.  He tolerated this procedure well and without  complications.  Postoperatively, he was able to be transferred to a  regular hospital room following recovery from anesthesia.  He was  monitored and remained hemodynamically stable.  He remained in sinus  rhythm as well.  On the morning of postoperative day 1, his hematocrit  was checked and found to be stable at 35.6.  He had excellent urine  output with minimal output from his pelvic drain and his pelvic drain  was therefore removed.  He continued ambulate without difficulty and  began a clear liquid diet which he tolerated without problems.  He was  transitioned to oral pain medication and was able to be discharged home  on the afternoon of postoperative day 1.   DISPOSITION:  Home.   DISCHARGE MEDICATIONS:  He was instructed to resume his regular home  medications excepting his Coumadin, which he was instructed to hold  until he followed up the following week.  He was given a prescription to  begin Vicodin for pain as needed, and to begin  Cipro 1 day prior to his  return visit for Foley catheter removal.   DISCHARGE INSTRUCTIONS:  He was instructed to be ambulatory, but  specifically told to refrain from any heavy lifting, strenuous activity,  or driving.  He was given instructions on routine Foley catheter care.  He was instructed to advance his diet once passing flatus.   FOLLOWUP:  Mr. Mavis will follow up in 1 week for removal of his Foley  catheter, and to discuss his surgical pathology in detail.      Heloise Purpura, MD  Electronically Signed     LB/MEDQ  D:  10/20/2007  T:  10/21/2007  Job:  [id number]

## 2011-02-23 NOTE — Op Note (Signed)
NAME:  Tyrone Mitchell, HUSER NO.:  [id number]   MEDICAL RECORD NO.:  [id number]          PATIENT TYPE:  INP   LOCATION:  1433                         FACILITY:  [facility]   PHYSICIAN:  Heloise Purpura, MD      DATE OF BIRTH:  [birth year]   DATE OF PROCEDURE:  10/19/2007  DATE OF DISCHARGE:                               OPERATIVE REPORT   PREOPERATIVE DIAGNOSIS:  Clinically localized adenocarcinoma of  prostate.   POSTOPERATIVE DIAGNOSIS:  Clinically localized adenocarcinoma of  prostate.   PROCEDURE:  Robotic assisted laparoscopic radical prostatectomy  (bilateral nerve sparing).   SURGEON:  Dr. Heloise Purpura.   ASSISTANT:  Dr. Georgeanna Lea   ANESTHESIA:  General.   COMPLICATIONS:  None.   ESTIMATED BLOOD LOSS:  50 mL.   INTRAVENOUS FLUIDS:  1600 mL of lactated Ringer's.   SPECIMENS:  Prostate seminal vesicles.   DISPOSITION OF SPECIMEN:  To pathology.   DRAINS:  1. 20-French coude catheter.  2. #19 Blake pelvic drain.   INDICATIONS:  Tyrone Mitchell is a 70 year old gentleman with clinically  localized adenocarcinoma of the prostate.  After discussing management  options for treatment, he elected to proceed with surgical therapy and  the above procedure.  Potential risks, complications, and alternative  options were discussed with the patient in detail and informed consent  was obtained.   DESCRIPTION OF PROCEDURE:  All the patient was taken to the operating  room and a general anesthetic was administered.  He was given  preoperative antibiotics, placed in the dorsal lithotomy position, and  prepped and draped in the usual sterile fashion.  Next a preoperative  time-out was performed.  A Foley catheter was inserted into the bladder.  A site was then selected just to the left of the umbilicus for placement  of the camera port.  This was placed using a standard open Hasson  technique.  This allowed entry into the peritoneal cavity under direct  vision  without difficulty.  A 12 mm port was then placed and a  pneumoperitoneum was established.  The 0 degrees lens was used to  inspect the abdomen and there was no evidence of any intra-abdominal  injuries or other abnormalities.  Attention was then turned to placement  of the remaining ports.  Bilateral 8 mm robotic ports were placed 10 cm  lateral to and just inferior to the camera port site.  An additional 8  mm robotic port was placed in the far left lateral abdominal wall.  A 5  mm port was then placed between the camera port and the right robotic  port.  An additional 12 mm port was placed in the far right lateral  abdominal wall for laparoscopic assistance.  All ports were placed under  direct vision and without difficulty.  The surgical cart was then  docked.  With the aid of the cautery scissors, the bladder was reflected  posteriorly allowing entry into the space of Retzius and identification  of the endopelvic fascia and prostate.  The endopelvic fascia was then  incised from the apex back to the base of prostate bilaterally  and the  underlying levator muscle fibers were swept laterally off the prostate  thereby isolating the dorsal venous complex which was then stapled and  divided with a 45 mm Flex ETS stapler.  The bladder neck was identified  with the aid of Foley catheter manipulation and was divided.  This  exposed the catheter and the catheter balloon was deflated.  Catheter  was brought into the operative field and used to retract the prostate  anteriorly.  This exposed the posterior bladder neck which was then  divided.  Dissection continued between the prostate and bladder until  the vasa deferentia and seminal vesicles were identified.  The vasa  deferentia were divided after they were isolated and they were lifted  anteriorly.  The seminal vesicles were dissected down to their tips and  then lifted anteriorly.  The space between Denonvilliers fascia and the  anterior  rectum was then bluntly developed thereby isolating the  vascular pedicles of prostate.  The lateral prostatic fascia was then  incised bilaterally and the neurovascular bundles were swept laterally  and posteriorly off the prostate.  The vascular pedicles were then  ligated with Hem-o-lok clips and sharply divided above the level of the  neurovascular bundles.  The neurovascular bundles were swept off the  apex of prostate and urethra and the urethra was divided allowing the  prostate specimen to be disarticulated.  The pelvis was then copiously  irrigated and hemostasis was ensured.  There was no evidence of a rectal  injury.  Attention then turned to the urethral anastomosis.  A 2-0  Vicryl slip-knot was then placed between Denonvilliers fascia and the  bladder neck and the urethra to reapproximate these structures.  A  double-armed 3-0 Monocryl suture was then used to perform a 360 degree  running tension-free anastomosis between the bladder neck and urethra.  A new 20-French coude catheter was inserted into the bladder and  irrigated.  The catheter initially irrigated well.  However, urine did  not return.  The catheter was removed and tested and appeared to be okay  and was replaced.  Again irrigation was able to be placed into the  bladder but was not returned.  There was no evidence of urine leakage at  the anastomosis.  Therefore a new 20-French coude catheter was placed  and was placed without difficulty.  Again this was irrigated and  irrigated without problems.  It was felt that first catheter had been  defective.  There no blood clots within the bladder either.  A #19 Blake  drain was brought through the left robotic port and appropriately  positioned the pelvis.  It was secured to skin with a nylon suture.  The  surgical cart was then undocked.  The right lateral 12 mm port site was  closed with a 0-0 Vicryl suture placed with the aid of the suture passer  device.  All  remaining ports were removed under direct vision.  The  prostate specimen was then removed intact within the Endopouch retrieval  bag.  This fascial opening was closed with a running 0-0 Vicryl suture.  All port sites were then injected with 0.25% Marcaine and reapproximated  at skin level with staples.  Sterile dressings were applied.  The  patient appeared to tolerate the procedure well without complications.  He was able to be extubated and transferred to recovery unit in  satisfactory condition.      Heloise Purpura, MD  Electronically Signed     LB/MEDQ  D:  10/19/2007  T:  10/20/2007  Job:  [id number]

## 2011-02-23 NOTE — Assessment & Plan Note (Signed)
[facility]                         ELECTROPHYSIOLOGY OFFICE NOTE   Tyrone Mitchell, Tyrone Mitchell                    MRN:          [id number]  DATE:03/07/2008                            DOB:          [birth year]    REFERRING PHYSICIAN:  Delaney Meigs, M.D.   Mr. Karwowski is referred today by Dr. Joette Catching for evaluation of  atrial fibrillation.  The patient has a history of hypertension.  He has  a history of atypical chest pain and fatigue.  He has preserved LV  function by echo.  His current problems began approximately 4 years ago  when he was diagnosed with atrial fibrillation at the [place].  He was placed on beta-blockers at that time and his  symptoms resolved and did not return until approximately 6 weeks ago,  when he had recurrent tachy palpitations and was documented have  recurrence of atrial fibrillation.  These episodes have come and gone,  but over the last week or so, have increased in severity and the patient  thinks that now he may be in A fib all the time.  He notes that at rest  he feels okay but when he does any activity, he becomes weak and short  of breath and has lack of energy.  He is referred now for additional  evaluation.  He does feel palpitations, particularly when he exerts  himself or when he lies down on his left side.  He has had no frank  syncope.  He denies peripheral edema.   CURRENT MEDICATIONS:  Niacin 1 gram daily, aspirin 81 a day, omeprazole  20 a day, diltiazem 90 a day, warfarin 5 mg daily, lorazepam 0.5 three  times a day, Naprosyn 500 a day, atenolol 25 a day, and oxybutynin.   PAST MEDICAL HISTORY:  Notable for a history of gallbladder removal,  prostate cancer surgery, and hernia repair.   SOCIAL HISTORY:  The patient is a retired Landscape architect.  He still smokes 2-  4 cigars per day and has done so for several years.  He denies alcohol  abuse.   REVIEW OF SYSTEMS:  Notable  for fatigue and weakness.  He has a history  of some anxiety and for this reason he is on benzodiazepines.  He has  chronic arthritis.  He has dysuria, hematuria and nocturia.  He notes  erectile dysfunction.  He has gastroesophageal reflux symptoms.  Other  systems reviewed and found be negative except as noted above.   FAMILY HISTORY:  Notable for his mother dying in her 64s of dementia,  father in his 75s of cancer.   EXAM:  He is a pleasant, 70 year old man in no acute distress.  The  blood pressure today was 123/68, the pulse was 96 and irregularly  irregular, the respirations were 18, the weight was 210 pounds.  HEENT:  Normocephalic and atraumatic.  Pupils are equal and round.  Oropharynx moist.  Sclerae anicteric.  NECK:  Revealed no jugular venous distention.  There is no thyromegaly.  The trachea was midline.  The carotids are 2+ and symmetric.  LUNGS:  Clear bilaterally to auscultation.  No wheezes, rales or rhonchi  are present.  There is no increased work of breathing.  CARDIAC:  Exam revealed an irregularly irregular rhythm with a normal S1  and S2.  The PMI was not enlarged nor was it laterally displaced.  ABDOMEN:  Soft, nontender.  There is no organomegaly.  Bowel sounds are  present.  No rebound or guarding.  EXTREMITIES:  Demonstrate no cyanosis, clubbing or edema.  The pulses  are 2+ and symmetric.  NEUROLOGIC:  Alert and oriented x3.  Cranial nerves intact.  Strength is  5/5 and symmetric.   EKG demonstrates atrial fibrillation with a controlled ventricular  response.   IMPRESSION:  1. Symptomatic atrial fibrillation, previously paroxysmal, now      persistent.  2. Hypertension.  3. Dyslipidemia.  4. Chronic Coumadin therapy secondary to all of the above.   DISCUSSION:  Mr. Huskins clearly is symptomatic from his A fib despite  being on beta-blockers and calcium blockers.  I have recommended that we  switch him from a calcium blocker to flecainide 100 mg  twice daily and  have him continue his beta-blockers.  I will have him come back for an  EKG in approximately 5 days and I will see him back in the office soon  thereafter and will schedule him a  treadmill test to rule out  proarrhythmia on flecainide.  He will be on 100 twice daily.     Doylene Canning. Ladona Ridgel, MD  Electronically Signed    GWT/MedQ  DD: 03/07/2008  DT: 03/07/2008  Job #: [id number]   cc:   Delaney Meigs, M.D.

## 2011-02-26 NOTE — Op Note (Signed)
NAME:  Tyrone Mitchell, Tyrone Mitchell NO.:  [id number]   MEDICAL RECORD NO.:  [id number]          PATIENT TYPE:  AMB   LOCATION:  DSC                          FACILITY:  [facility]   PHYSICIAN:  Thornton Park. Daphine Deutscher, MD  DATE OF BIRTH:  [birth year]   DATE OF PROCEDURE:  08/19/2004  DATE OF DISCHARGE:                                 OPERATIVE REPORT   PREOPERATIVE DIAGNOSIS:  Recurrent right inguinal hernia.   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   DESCRIPTION OF PROCEDURE:  The patient was given general by LMA in room 4.  Prepped with Betadine and draped sterilely.  A small oblique incision was  made through his older scar, and I carried this down through some fairly  obliterated scar tissue.  I opened the remnant of his external oblique and  mobilized the cord structures.  He had some lipomas proximally but no hernia  sac proximally.  Floor looked somewhat weakened medially, but he also had a  bulge more in his scrotum which I mobilized and found a hydrocele.  I went  ahead and brought this up into the wound, opened it, and removed a portion  of the hydrocele sac.  This was then placed back down in the scrotum.  The  floor was then reinforced with a piece of mesh cut to fit and sutured along  the inguinal ligament with running 2-0 Prolene inferiorly and medially and  then sutured around itself with horizontal mattress suture.  Hernia was  injected with 0.5% Marcaine.  The remnant of the external oblique was then  closed over the mesh with interrupted 2-0 Vicryl.  4-0 Vicryl was used in  the subcutaneous tissue, and then the skin was closed with a running  subcuticular 4-0 Vicryl.  Dermabond was used to seal the skin, and a sterile  dressing was applied.  The patient seemed to tolerate the procedure well.  The patient will be given Vicodin to take for pain and will be followed up  in the office in three to four weeks.      Matt   MBM/MEDQ   D:  08/19/2004  T:  08/19/2004  Job:  [id number]   cc:   Delaney Meigs, M.D.  [address]  [place]  [place] [address]  Fax: [phone]

## 2011-05-26 ENCOUNTER — Telehealth: Payer: Self-pay | Admitting: Internal Medicine

## 2011-05-26 NOTE — Telephone Encounter (Signed)
Pt having hip surgery Aug 28th and he needs clearence

## 2011-05-26 NOTE — Telephone Encounter (Signed)
Called and spoke with pt Dr Charlann Boxer is doing surgery  Will fax the clearance for him

## 2011-05-28 NOTE — Telephone Encounter (Signed)
Form being faxed over today.

## 2011-05-31 ENCOUNTER — Other Ambulatory Visit: Payer: Self-pay | Admitting: Orthopedic Surgery

## 2011-05-31 ENCOUNTER — Encounter (HOSPITAL_COMMUNITY): Payer: Medicare Other

## 2011-05-31 LAB — BASIC METABOLIC PANEL
BUN: 13 mg/dL (ref 6–23)
Chloride: 104 mEq/L (ref 96–112)
Creatinine, Ser: 0.85 mg/dL (ref 0.50–1.35)
Glucose, Bld: 104 mg/dL — ABNORMAL HIGH (ref 70–99)
Potassium: 4.6 mEq/L (ref 3.5–5.1)

## 2011-05-31 LAB — URINE MICROSCOPIC-ADD ON

## 2011-05-31 LAB — URINALYSIS, ROUTINE W REFLEX MICROSCOPIC
Leukocytes, UA: NEGATIVE
Nitrite: NEGATIVE
Specific Gravity, Urine: 1.025 (ref 1.005–1.030)
Urobilinogen, UA: 0.2 mg/dL (ref 0.0–1.0)
pH: 5.5 (ref 5.0–8.0)

## 2011-05-31 LAB — DIFFERENTIAL
Eosinophils Relative: 4 % (ref 0–5)
Monocytes Absolute: 0.6 10*3/uL (ref 0.1–1.0)
Monocytes Relative: 8 % (ref 3–12)

## 2011-05-31 LAB — CBC: MCV: 94.9 fL (ref 78.0–100.0)

## 2011-06-07 NOTE — H&P (Signed)
NAME:  BRIANNA, ESSON NO.:  [id number]  MEDICAL RECORD NO.:  [id number]  LOCATION:                                 FACILITY:  PHYSICIAN:  Madlyn Frankel. Charlann Boxer, M.D.  DATE OF BIRTH:  June 15, 1941  DATE OF ADMISSION: DATE OF DISCHARGE:                             HISTORY & PHYSICAL   DATE OF SURGERY:  June 08, 2011.  DIAGNOSIS:  Right hip osteoarthritis.  HISTORY OF PRESENT ILLNESS:  The patient is a 70 year old male who is complaining of right hip pain for about 10 plus years.  The patient denies any initial trauma or event.  The patient does say he has progressively worsened over the years.  The patient initially thought it was from his back.  He did have back surgery, though the hip pain continued.  The patient has x-rays of bilateral hips, both showing arthritic change of both the right and left, right being worse than left.  The patient has had injections in both hips, which have been ineffective in controlling his symptoms.  Various options were discussed with the patient.  The patient wished to proceed with surgery.  Risks, benefits, and expectations of the procedure were discussed with the patient.  The patient understands the risks, benefits, and expectation, and wishes to proceed with surgery.  The patient is not a candidate for tranexamic acid and will not be given this prior to surgery.  PRIMARY CARE PHYSICIAN:  Delaney Meigs, M.D.  CARDIOLOGIST:  Doylene Canning. Ladona Ridgel, MD  UROLOGIST:  Heloise Purpura, MD  SPINAL NEUROSURGEON:  Payton Doughty, M.D.  PAST MEDICAL HISTORY: 1. GERD. 2. Elevated cholesterol. 3. Afib. 4. Occasional vertigo. 5. Prostate disease. 6. Urinary incontinence. 7. Previous prostate cancer.  PAST SURGICAL HISTORY: 1. Excision of cancer in 2009. 2. Cholecystectomy in 2001. 3. Back surgery in 2011. 4. Hernia repair in 1998. 5. Hydrocele surgery in 2010.  MEDICATIONS: 1. Flecainide 100 mg 2 q. day. 2. Omeprazole 20 mg one  q. day. 3. Warfarin 5 mg one q. day. 4. Aspirin 81 mg one q. day (stop 5 days prior to surgery). 5. Oxycodone 5/325 p.o. q. 8 h. 6. Niacin 500 mg one p.o. q. day. 7. Simvastatin 20 mg one p.o. q. day.  ALLERGIES:  The patient denies any allergies.  SOCIAL HISTORY:  The patient admits to smoking 3 to 4 small cigars a day.  REVIEW OF SYSTEMS:  The patient does complain of occasional constipation, incontinence, urinating frequently at night with weak stream.  The patient does also complain of joint pain, back pain, spasms, morning stiffness, otherwise remarkable.  PHYSICAL EXAMINATION:  GENERAL:  The patient is a 70 year old gentleman in no acute distress. VITAL SIGNS:  Stable.  Blood pressure in left arm is 134/80.  Pulse 80. Respirations 14. HEENT:  Pupils equal, round, reactive to light and accommodation. Throat is clear. NECK:  Supple.  No JVD.  No carotid bruits.  No lymphadenopathy noted. CARDIO:  Normal appearing S1, S2.  No murmur appreciated. RESPIRATORY:  Lungs clear to auscultation bilaterally. NEURO:  The patient is oriented x3.  Ortho was pertaining to the hip, the patient does have significant pain with movement of the  right hip especially with internal and external rotation.  The patient is distally neurovascularly intact.  The patient has +2 dorsalis pedis pulse.  The patient has no pain on palpation of the lateral aspect of the hip.  Most the pain is into the groin.  IMPRESSION:  Right hip osteoarthritis.  PLAN:  The patient admitted to the hospital to undergo right total hip replacement per [name] at [facility] with an anterior approach. Risks, benefits, and expectations of procedure were discussed with the patient.  The patient understands the risks, benefits, and expectations, and wished to proceed with surgery.    ______________________________ Lanney Gins, PA   ______________________________ Madlyn Frankel. Charlann Boxer, M.D.    MB/MEDQ  D:  06/03/2011   T:  06/04/2011  Job:  [id number]  Electronically Signed by Lanney Gins PA on 06/04/2011 05:07:11 PM Electronically Signed by Durene Romans M.D. on 06/07/2011 07:43:59 AM

## 2011-06-08 ENCOUNTER — Inpatient Hospital Stay (HOSPITAL_COMMUNITY): Payer: Medicare Other

## 2011-06-08 ENCOUNTER — Inpatient Hospital Stay (HOSPITAL_COMMUNITY)
Admission: RE | Admit: 2011-06-08 | Discharge: 2011-06-10 | DRG: 470 | Disposition: A | Discharge status: Home Health | Payer: Medicare Other | Source: Ambulatory Visit | Attending: Orthopedic Surgery | Admitting: Orthopedic Surgery

## 2011-06-08 DIAGNOSIS — G8929 Other chronic pain: Secondary | ICD-10-CM | POA: Diagnosis present

## 2011-06-08 DIAGNOSIS — Z8546 Personal history of malignant neoplasm of prostate: Secondary | ICD-10-CM

## 2011-06-08 DIAGNOSIS — Z01812 Encounter for preprocedural laboratory examination: Secondary | ICD-10-CM

## 2011-06-08 DIAGNOSIS — M549 Dorsalgia, unspecified: Secondary | ICD-10-CM | POA: Diagnosis present

## 2011-06-08 DIAGNOSIS — R32 Unspecified urinary incontinence: Secondary | ICD-10-CM | POA: Diagnosis present

## 2011-06-08 DIAGNOSIS — K219 Gastro-esophageal reflux disease without esophagitis: Secondary | ICD-10-CM | POA: Diagnosis present

## 2011-06-08 DIAGNOSIS — M1610 Unilateral primary osteoarthritis, unspecified hip: Principal | ICD-10-CM | POA: Diagnosis present

## 2011-06-08 DIAGNOSIS — M169 Osteoarthritis of hip, unspecified: Principal | ICD-10-CM | POA: Diagnosis present

## 2011-06-08 DIAGNOSIS — E7800 Pure hypercholesterolemia, unspecified: Secondary | ICD-10-CM | POA: Diagnosis present

## 2011-06-08 DIAGNOSIS — I4891 Unspecified atrial fibrillation: Secondary | ICD-10-CM | POA: Diagnosis present

## 2011-06-08 LAB — APTT: aPTT: 29 seconds (ref 24–37)

## 2011-06-08 LAB — TYPE AND SCREEN: Antibody Screen: NEGATIVE

## 2011-06-08 LAB — PROTIME-INR: Prothrombin Time: 14.1 seconds (ref 11.6–15.2)

## 2011-06-09 LAB — CBC
HCT: 32.2 % — ABNORMAL LOW (ref 39.0–52.0)
Hemoglobin: 10.6 g/dL — ABNORMAL LOW (ref 13.0–17.0)
MCHC: 32.9 g/dL (ref 30.0–36.0)
MCV: 96.1 fL (ref 78.0–100.0)

## 2011-06-09 LAB — PROTIME-INR: INR: 1.18 (ref 0.00–1.49)

## 2011-06-09 LAB — BASIC METABOLIC PANEL
CO2: 29 mEq/L (ref 19–32)
Chloride: 104 mEq/L (ref 96–112)
Creatinine, Ser: 0.8 mg/dL (ref 0.50–1.35)
Glucose, Bld: 137 mg/dL — ABNORMAL HIGH (ref 70–99)

## 2011-06-10 LAB — BASIC METABOLIC PANEL
BUN: 10 mg/dL (ref 6–23)
CO2: 27 mEq/L (ref 19–32)
Chloride: 104 mEq/L (ref 96–112)
GFR calc Af Amer: >60 mL/min (ref >60)
Potassium: 3.6 mEq/L (ref 3.5–5.1)

## 2011-06-10 LAB — CBC
HCT: 29.4 % — ABNORMAL LOW (ref 39.0–52.0)
Hemoglobin: 9.8 g/dL — ABNORMAL LOW (ref 13.0–17.0)
WBC: 7.9 10*3/uL (ref 4.0–10.5)

## 2011-06-10 LAB — PROTIME-INR
INR: 1.26 (ref 0.00–1.49)
Prothrombin Time: 16.1 seconds — ABNORMAL HIGH (ref 11.6–15.2)

## 2011-06-11 NOTE — Op Note (Signed)
NAMETERI, DILTZ NO.:  [id number]  MEDICAL RECORD NO.:  [id number]  LOCATION:  1603                         FACILITY:  [facility]  PHYSICIAN:  Madlyn Frankel. Charlann Boxer, M.D.  DATE OF BIRTH:  Mar 19, 1941  DATE OF PROCEDURE:  06/08/2011 DATE OF DISCHARGE:                              OPERATIVE REPORT   PREOPERATIVE DIAGNOSIS:  Right hip osteoarthritis.  POSTOPERATIVE DIAGNOSIS:  Right hip osteoarthritis.  PROCEDURE:  Right total hip replacement with an anterior approach with a size 58 pinnacle cup 36 +4 neutral all tracts liner and size 8 high trial lock stem with a 36 +1.5 Delta ceramic ball.  SURGEON:  Madlyn Frankel. Charlann Boxer, M.D.  ASSISTANT:  Lanney Gins, PA-C  ANESTHESIA:  General.  BLOOD LOSS:  About 500 cc.  DRAINS:  One Hemovac.  SPECIMEN:  None.  COMPLICATION.:  None.  INDICATIONS OF PROCEDURE:  Mr. Anderle is a 70 year old gentleman who presented to the office for evaluation of right hip pain.  Radiographs revealed endstage bone-on-bone changes to his right hip.  He had failed conservative measures with medications, activity modification.  At this point, he is ready to proceed with more definitive measures.  Consent was obtained for the benefit of pain relief after reviewing and explaining the risks of infection, DVT, component failure, dislocation, need for revision surgery as well as the approaches and the pros and cons thereof.  PROCEDURE IN DETAIL:  The patient was brought to the operative theater. Once adequate anesthesia, preoperative antibiotics, and Ancef was administered, the patient was positioned supine on the OSI Hana table with his right arm abducted across the body.  Bony prominences padded. Once he was comfortable with his overall position, his right hip was prepped out and the site was draped out, and fluoroscopy was used to confirm orientation of pelvis and positioning.  At this point, the right hip was then prepped and draped in  sterile fashion using shower curtain technique from the proximal to the iliac wing to the midthigh.  Landmarks identified and a timeout was performed, identifying the patient, planned procedure, and extremity.  Incision was then made over the anterior aspect of the thigh, 2 cm distal and lateral to the anterior-superior aortic spine.  Soft tissue planes created in the fascia.  The tensor fascia lata muscle incised, and the muscle belly identified and swept laterally.  The retractor placed along the superior neck and then inferior along the neck of the femur.  Pericapsular fat and circumflex vessels cauterized.  The capsule was identified.  An L capsulotomy was made over the superior aspect neck extending from the trochanteric fossa to the lesser trochanter and then proximal.  The capsular flaps created and stay sutures placed.  The retractors were placed intracapsular.  At this point under fluoroscopic imaging, we evaluated in orientation of the neck cut.  He was noted to have relatively short femoral neck with the femoral head that appeared to be lower than the center of his trochanter.  It also appeared preoperatively that his radiographs of his right hip was a little longer than his left, and we at least were trying to match that on these postoperatively.  Neck osteotomy was then made.  Femoral head removed and traction taken off the femur.  At this point, retractors were placed anterior and posterior.  Labrum soft tissue debrided.  I began reaming and reamed up to 57 reamer with good bony bed preparation.  The last reaming was done under fluoroscopic imaging to confirm orientation of reaming and depth.  A 58 cup was then chosen and under fluoroscopic guidance, impacted into position with good orientation with palpable rim anteriorly as well as portion of the cup exposed posteriorly.  Single cancellous screw was placed in the hole eliminator placed in the final 36 +4  neutral all tracts liner, impacted in position with good secure rim fit.  At this point, a lateral hook was placed on the femur.  The femur was elevated manually and held in position with the assistance of the bed.  The femur was then externally rotated and a retractor placed medially. The leg was then extended and abducted, and then a retractor placed over the posterior trochanter, and posterior capsular tissue was debrided off the backside of the femur.  Box osteotome was then used to set orientation of the broach.  I refer broaching.  At this point, the starting broach was then passed by hand.  I then began broaching and broached up initially to a size 6 which I had felt was going to be small anyway, but I do want to get an ideal orientation and position.  At this point, first utilizing a standard neck and then trialing with a high offset neck, we felt that we needed to probably reduce the neck cut a little bit more but also we would be going up in size of component. At this point, we removed the trial components, replaced all retractors. Removed the trial broach and removed a few more millimeters of the neck. I then broached to a size 8 which at this point, sat at the level of the neck cut.  Based on my trial reduction and depth and length, I chose the 8 high trial lock stem for offset process in addition to the depth of reaming.  I retrialed and chose a 36 +1.5 ball that felt is best, equalized our leg lengths from the contralateral hip and the preoperative pelvis radiograph.  Final 36 +1.5 Delta ceramic ball was chosen, impacted onto clean dry trunnion, and the hip reduced again.  We had irrigated the hip throughout the case.  Again at this point, I reapproximated the anterior capsular tissues using #1 Vicryl, placed a medium Hemovac drain deep.  The fascia of the tensor fascia lata muscle was then reapproximated using #1 Vicryl over top of this and remainder of wound was closed  with 2-0 Vicryl and running 4-0 Monocryl.  The hip was cleaned, dried, and dressed sterilely using Dermabond occlusive dressing.  Drain site dressed separately.  The patient then brought to recovery room and extubated in stable condition, tolerating the procedure well.     Madlyn Frankel Charlann Boxer, M.D.     MDO/MEDQ  D:  06/09/2011  T:  06/09/2011  Job:  [id number]  Electronically Signed by Durene Romans M.D. on 06/11/2011 09:03:44 AM

## 2011-06-11 NOTE — Discharge Summary (Signed)
NAMELYRIC, HOAR NO.:  [id number]  MEDICAL RECORD NO.:  [id number]  LOCATION:  1603                         FACILITY:  [facility]  PHYSICIAN:  Madlyn Frankel. Charlann Boxer, M.D.  DATE OF BIRTH:  [birth year]  DATE OF ADMISSION:  06/08/2011 DATE OF DISCHARGE:  06/10/2011                              DISCHARGE SUMMARY   PROCEDURES:  Right total hip arthroplasty, anterior approach.  ATTENDING PHYSICIAN:  Madlyn Frankel. Charlann Boxer, M.D.  ADMITTING DIAGNOSIS:  Right hip osteoarthritis.  DISCHARGE DIAGNOSES: 1. Status post right total hip arthroplasty, anterior approach. 2. Gastroesophageal reflux disease 3. Hypercholesteremia. 4. Atrial fibrillation. 5. Occasional vertigo. 6. Prostate disease. 7. Urinary incontinence. 8. Previous prostate cancer.  HISTORY OF PRESENT ILLNESS:  The patient is a 70 year old male who is complaining of right hip pain for about 10 plus years.  The patient has denied initial trauma or event.  The patient does state it is progressively worsened over the years.  The patient initially got it was from his back.  He did have back surgery though the hip pain continued. The patient has had x-rays of bilateral hips, both showing arthritic changes, right worse than left.  The patient has had injection in both hips, all of which had been ineffective controlling his symptoms. Various options were discussed with the patient.  The patient wishes to proceed with surgery.  Risks, benefits, and expectations of the procedure were discussed with the patient.  The patient understands the risks, benefits, and expectations and wishes to proceed with surgery.  HOSPITAL COURSE:  The patient underwent the above-stated procedure on June 08, 2011.  The patient tolerated the procedure well, was brought to the recovery room in good condition and subsequently to the floor.  Postop day #1, June 09, 2011; the patient doing well, no events.  The patient did have some bleeding  from his Hemovac site over the night, which was just reinforced, this eventually did stop.  The patient is afebrile.  Vital signs stable.  H and H are 10.6 and 32.2.  Dressing was good, dry, clean, and intact.  She was distally neurovascular intact. Hemovac was removed.  The patient had a physical therapy.  Postop day 2, June 10, 2011; the patient doing well, no events.  Pain is well-controlled.  The patient feels well and wants to go home.  He is afebrile.  Vital signs stable.  H and H 9.8/29.4.  He is distally neurovascularly intact.  Dressing was clean, dry, and intact.  The patient will have physical therapy and be discharged home.  DISCHARGE CONDITION:  Good.  DISCHARGE INSTRUCTIONS:  The patient is currently doing well enough to be discharged home in 06/10/2011.  The patient will be discharged with home health PT.  The patient will be weightbearing as tolerated.  The patient will be maintaining his surgical dressing for 7-8 days; at this time, he will replace with a gauze and tape.  The patient is to keep the area dry and clean until follow up.  The patient will follow up with [name] at [facility] in 2 weeks.  The patient is to call if any questions or concerns.  DISCHARGE MEDICATIONS: 1. Benadryl 25 mg  1 p.o. q.4 hours p.r.n. 2. Colace 100 mg 1 p.o. b.i.d. constipation. 3. Iron sulfate 325 mg 1 p.o. t.i.d. x2-3 weeks. 4. MiraLax 17 g 1 p.o. daily p.r.n. constipation. 5. Robaxin 500 mg 1 p.o. q.6 h p.r.n. muscle spasms. 6. Oxycodone 5 mg 1-2 p.o. q.4-6 hours p.r.n. pain. 7. Aspirin enteric-coated 81 mg one p.o. daily 8. Flecainide 100 mg one p.o. b.i.d. 9. Niacin 500 mg 1 p.o. q.h.s. 10.Omeprazole 20 mg 1 p.o. daily 11.Simvastatin 20 mg 1 p.o. q.h.s. 12.Coumadin 5 mg 1 p.o. daily.    ______________________________ Lanney Gins, PA   ______________________________ Madlyn Frankel. Charlann Boxer, M.D.    MB/MEDQ  D:  06/10/2011  T:  06/10/2011  Job:   [id number]  Electronically Signed by Lanney Gins PA on 06/11/2011 08:50:53 AM Electronically Signed by Durene Romans M.D. on 06/11/2011 09:03:49 AM

## 2011-07-01 LAB — CBC
HCT: 42.5
Hemoglobin: 14.6
MCHC: 34.4
MCV: 93.4
RDW: 14.8

## 2011-07-01 LAB — HEMOGLOBIN AND HEMATOCRIT, BLOOD
HCT: 35.6 — ABNORMAL LOW
HCT: 37.7 — ABNORMAL LOW
Hemoglobin: 12.4 — ABNORMAL LOW
Hemoglobin: 13

## 2011-07-01 LAB — BASIC METABOLIC PANEL
CO2: 28
Glucose, Bld: 121 — ABNORMAL HIGH
Potassium: 4.2
Sodium: 145

## 2011-07-01 LAB — ABO/RH: ABO/RH(D): A POS

## 2011-07-07 LAB — BASIC METABOLIC PANEL
BUN: 16
CO2: 27
Chloride: 108
Creatinine, Ser: 1.09
GFR calc non Af Amer: >60
Glucose, Bld: 99
Potassium: 3.9
Sodium: 139

## 2011-07-07 LAB — DIFFERENTIAL
Basophils Relative: 0
Eosinophils Absolute: 0.2
Lymphs Abs: 1.4
Neutrophils Relative %: 69

## 2011-07-07 LAB — CBC
MCV: 92.5
Platelets: 154
WBC: 7.3

## 2011-07-07 LAB — TSH: TSH: 0.943

## 2011-07-07 LAB — CARDIAC PANEL(CRET KIN+CKTOT+MB+TROPI)
CK, MB: 2.6
CK, MB: 3.1
Relative Index: 2.1
Relative Index: 2.2
Total CK: 121
Troponin I: 0.02
Troponin I: 0.04

## 2011-07-07 LAB — LIPID PANEL
HDL: 28 — ABNORMAL LOW
LDL Cholesterol: 123 — ABNORMAL HIGH
Triglycerides: 176 — ABNORMAL HIGH
VLDL: 35

## 2011-07-07 LAB — PROTIME-INR
INR: 2.3 — ABNORMAL HIGH
Prothrombin Time: 25.7 — ABNORMAL HIGH
Prothrombin Time: 28.6 — ABNORMAL HIGH

## 2011-07-07 LAB — TROPONIN I: Troponin I: 0.04

## 2011-07-07 LAB — POCT CARDIAC MARKERS: Myoglobin, poc: 98.6

## 2011-07-07 LAB — HEMOGLOBIN A1C: Mean Plasma Glucose: 140

## 2011-08-16 ENCOUNTER — Emergency Department (HOSPITAL_COMMUNITY): Payer: Medicare Other

## 2011-08-16 ENCOUNTER — Other Ambulatory Visit: Payer: Self-pay | Admitting: Orthopaedic Surgery

## 2011-08-16 ENCOUNTER — Inpatient Hospital Stay (HOSPITAL_COMMUNITY)
Admission: EM | Admit: 2011-08-16 | Discharge: 2011-08-23 | DRG: 184 | Disposition: A | Discharge status: Skilled Nursing Facility | Payer: Medicare Other | Attending: General Surgery | Admitting: General Surgery

## 2011-08-16 ENCOUNTER — Encounter (HOSPITAL_COMMUNITY): Payer: Self-pay

## 2011-08-16 DIAGNOSIS — W319XXA Contact with unspecified machinery, initial encounter: Secondary | ICD-10-CM

## 2011-08-16 DIAGNOSIS — S2249XA Multiple fractures of ribs, unspecified side, initial encounter for closed fracture: Secondary | ICD-10-CM

## 2011-08-16 DIAGNOSIS — IMO0002 Reserved for concepts with insufficient information to code with codable children: Secondary | ICD-10-CM | POA: Diagnosis present

## 2011-08-16 DIAGNOSIS — I714 Abdominal aortic aneurysm, without rupture, unspecified: Secondary | ICD-10-CM | POA: Diagnosis present

## 2011-08-16 DIAGNOSIS — S4421XA Injury of radial nerve at upper arm level, right arm, initial encounter: Secondary | ICD-10-CM | POA: Diagnosis present

## 2011-08-16 DIAGNOSIS — S81801A Unspecified open wound, right lower leg, initial encounter: Secondary | ICD-10-CM | POA: Diagnosis present

## 2011-08-16 DIAGNOSIS — M2390 Unspecified internal derangement of unspecified knee: Secondary | ICD-10-CM

## 2011-08-16 DIAGNOSIS — Y92009 Unspecified place in unspecified non-institutional (private) residence as the place of occurrence of the external cause: Secondary | ICD-10-CM

## 2011-08-16 DIAGNOSIS — S4980XA Other specified injuries of shoulder and upper arm, unspecified arm, initial encounter: Secondary | ICD-10-CM

## 2011-08-16 DIAGNOSIS — S2239XA Fracture of one rib, unspecified side, initial encounter for closed fracture: Secondary | ICD-10-CM

## 2011-08-16 DIAGNOSIS — F17200 Nicotine dependence, unspecified, uncomplicated: Secondary | ICD-10-CM | POA: Diagnosis present

## 2011-08-16 DIAGNOSIS — K5900 Constipation, unspecified: Secondary | ICD-10-CM | POA: Diagnosis present

## 2011-08-16 DIAGNOSIS — Z96649 Presence of unspecified artificial hip joint: Secondary | ICD-10-CM

## 2011-08-16 DIAGNOSIS — Z7901 Long term (current) use of anticoagulants: Secondary | ICD-10-CM

## 2011-08-16 DIAGNOSIS — D62 Acute posthemorrhagic anemia: Secondary | ICD-10-CM | POA: Diagnosis not present

## 2011-08-16 DIAGNOSIS — S8010XA Contusion of unspecified lower leg, initial encounter: Secondary | ICD-10-CM | POA: Diagnosis present

## 2011-08-16 DIAGNOSIS — I4891 Unspecified atrial fibrillation: Secondary | ICD-10-CM | POA: Diagnosis present

## 2011-08-16 DIAGNOSIS — S83419A Sprain of medial collateral ligament of unspecified knee, initial encounter: Secondary | ICD-10-CM

## 2011-08-16 DIAGNOSIS — Z79899 Other long term (current) drug therapy: Secondary | ICD-10-CM

## 2011-08-16 DIAGNOSIS — S5420XA Injury of radial nerve at forearm level, unspecified arm, initial encounter: Secondary | ICD-10-CM | POA: Diagnosis present

## 2011-08-16 DIAGNOSIS — I10 Essential (primary) hypertension: Secondary | ICD-10-CM | POA: Diagnosis present

## 2011-08-16 DIAGNOSIS — T148XXA Other injury of unspecified body region, initial encounter: Secondary | ICD-10-CM

## 2011-08-16 DIAGNOSIS — S2241XA Multiple fractures of ribs, right side, initial encounter for closed fracture: Secondary | ICD-10-CM | POA: Diagnosis present

## 2011-08-16 DIAGNOSIS — E785 Hyperlipidemia, unspecified: Secondary | ICD-10-CM | POA: Diagnosis present

## 2011-08-16 DIAGNOSIS — X58XXXA Exposure to other specified factors, initial encounter: Secondary | ICD-10-CM

## 2011-08-16 DIAGNOSIS — K560 Paralytic ileus: Secondary | ICD-10-CM | POA: Diagnosis not present

## 2011-08-16 DIAGNOSIS — W309XXA Contact with unspecified agricultural machinery, initial encounter: Secondary | ICD-10-CM

## 2011-08-16 HISTORY — DX: Gastro-esophageal reflux disease without esophagitis: K21.9

## 2011-08-16 HISTORY — DX: Unspecified atrial fibrillation: I48.91

## 2011-08-16 HISTORY — DX: Hyperlipidemia, unspecified: E78.5

## 2011-08-16 HISTORY — DX: Anemia, unspecified: D64.9

## 2011-08-16 LAB — CBC
MCV: 92.1 fL (ref 78.0–100.0)
Platelets: 205 10*3/uL (ref 150–400)
RBC: 3.94 MIL/uL — ABNORMAL LOW (ref 4.22–5.81)
WBC: 17.3 10*3/uL — ABNORMAL HIGH (ref 4.0–10.5)

## 2011-08-16 LAB — COMPREHENSIVE METABOLIC PANEL
Albumin: 3.7 g/dL (ref 3.5–5.2)
BUN: 16 mg/dL (ref 6–23)
Calcium: 9.3 mg/dL (ref 8.4–10.5)
Chloride: 105 mEq/L (ref 96–112)
Creatinine, Ser: 1.08 mg/dL (ref 0.50–1.35)
Total Bilirubin: 0.3 mg/dL (ref 0.3–1.2)
Total Protein: 6.8 g/dL (ref 6.0–8.3)

## 2011-08-16 LAB — PROTIME-INR: Prothrombin Time: 28.1 seconds — ABNORMAL HIGH (ref 11.6–15.2)

## 2011-08-16 LAB — CK TOTAL AND CKMB (NOT AT ARMC)
CK, MB: 5.7 ng/mL — ABNORMAL HIGH (ref 0.3–4.0)
Relative Index: 1.8 (ref 0.0–2.5)

## 2011-08-16 LAB — POCT I-STAT, CHEM 8
BUN: 18 mg/dL (ref 6–23)
Creatinine, Ser: 1 mg/dL (ref 0.50–1.35)
Glucose, Bld: 182 mg/dL — ABNORMAL HIGH (ref 70–99)
Hemoglobin: 12.9 g/dL — ABNORMAL LOW (ref 13.0–17.0)
Potassium: 4.2 meq/L (ref 3.5–5.1)

## 2011-08-16 MED ORDER — DOCUSATE SODIUM 100 MG PO CAPS
100.0000 mg | ORAL_CAPSULE | Freq: Two times a day (BID) | ORAL | Status: DC
  Administered 2011-08-17 – 2011-08-23 (×12): 100 mg via ORAL
  Filled 2011-08-16 (×17): qty 1

## 2011-08-16 MED ORDER — ONDANSETRON HCL 4 MG/2ML IJ SOLN
4.0000 mg | Freq: Four times a day (QID) | INTRAMUSCULAR | Status: DC | PRN
  Filled 2011-08-16: qty 2

## 2011-08-16 MED ORDER — MORPHINE SULFATE (PF) 1 MG/ML IV SOLN
INTRAVENOUS | Status: DC
  Administered 2011-08-17 – 2011-08-18 (×5): via INTRAVENOUS
  Administered 2011-08-18: 9.78 mg via INTRAVENOUS

## 2011-08-16 MED ORDER — SODIUM CHLORIDE 0.9 % IJ SOLN
9.0000 mL | INTRAMUSCULAR | Status: DC | PRN
  Administered 2011-08-21: 3 mL via INTRAVENOUS

## 2011-08-16 MED ORDER — FENTANYL CITRATE 0.05 MG/ML IJ SOLN
100.0000 ug | Freq: Once | INTRAMUSCULAR | Status: AC
  Administered 2011-08-16: 100 ug via INTRAVENOUS
  Filled 2011-08-16: qty 2

## 2011-08-16 MED ORDER — PANTOPRAZOLE SODIUM 40 MG IV SOLR: 40.0000 mg | Freq: Every day | INTRAVENOUS | Status: DC

## 2011-08-16 MED ORDER — DIPHENHYDRAMINE HCL 12.5 MG/5ML PO ELIX
12.5000 mg | ORAL_SOLUTION | Freq: Four times a day (QID) | ORAL | Status: DC | PRN
  Filled 2011-08-16: qty 10

## 2011-08-16 MED ORDER — FLECAINIDE ACETATE 100 MG PO TABS
100.0000 mg | ORAL_TABLET | Freq: Two times a day (BID) | ORAL | Status: DC
  Administered 2011-08-17 – 2011-08-23 (×13): 100 mg via ORAL
  Filled 2011-08-16 (×17): qty 1

## 2011-08-16 MED ORDER — SODIUM CHLORIDE 0.9 % IV BOLUS (SEPSIS): 500.0000 mL | Freq: Once | INTRAVENOUS | Status: DC

## 2011-08-16 MED ORDER — MORPHINE SULFATE 2 MG/ML IJ SOLN
1.0000 mg | INTRAMUSCULAR | Status: DC | PRN
  Administered 2011-08-16: 2 mg via INTRAVENOUS
  Filled 2011-08-16: qty 1

## 2011-08-16 MED ORDER — ONDANSETRON HCL 4 MG PO TABS: 4.0000 mg | ORAL_TABLET | Freq: Four times a day (QID) | ORAL | Status: DC | PRN

## 2011-08-16 MED ORDER — DIPHENHYDRAMINE HCL 50 MG/ML IJ SOLN: 12.5000 mg | Freq: Four times a day (QID) | INTRAMUSCULAR | Status: DC | PRN

## 2011-08-16 MED ORDER — SODIUM CHLORIDE 0.9 % IV SOLN
INTRAVENOUS | Status: DC
  Filled 2011-08-16 (×11): qty 1000

## 2011-08-16 MED ORDER — IOHEXOL 300 MG/ML  SOLN
100.0000 mL | Freq: Once | INTRAMUSCULAR | Status: AC | PRN
  Administered 2011-08-16: 100 mL via INTRAVENOUS

## 2011-08-16 MED ORDER — BISACODYL 10 MG RE SUPP: 10.0000 mg | Freq: Every day | RECTAL | Status: DC | PRN

## 2011-08-16 MED ORDER — ONDANSETRON HCL 4 MG/2ML IJ SOLN
4.0000 mg | Freq: Four times a day (QID) | INTRAMUSCULAR | Status: DC | PRN
  Administered 2011-08-17 – 2011-08-18 (×3): 4 mg via INTRAVENOUS
  Filled 2011-08-16 (×2): qty 2

## 2011-08-16 MED ORDER — SIMVASTATIN 20 MG PO TABS
20.0000 mg | ORAL_TABLET | Freq: Every day | ORAL | Status: DC
  Administered 2011-08-17 – 2011-08-22 (×6): 20 mg via ORAL
  Filled 2011-08-16 (×8): qty 1

## 2011-08-16 MED ORDER — SODIUM CHLORIDE 0.9 % IV SOLN
INTRAVENOUS | Status: DC
  Administered 2011-08-16 – 2011-08-17 (×3): via INTRAVENOUS

## 2011-08-16 MED ORDER — NALOXONE HCL 0.4 MG/ML IJ SOLN: 0.4000 mg | INTRAMUSCULAR | Status: DC | PRN

## 2011-08-16 MED ORDER — NIACIN 500 MG PO TABS
500.0000 mg | ORAL_TABLET | Freq: Every day | ORAL | Status: DC
  Administered 2011-08-17 – 2011-08-23 (×4): 500 mg via ORAL
  Filled 2011-08-16 (×8): qty 1

## 2011-08-16 MED ORDER — PANTOPRAZOLE SODIUM 40 MG PO TBEC
40.0000 mg | DELAYED_RELEASE_TABLET | Freq: Every day | ORAL | Status: DC
  Administered 2011-08-17 – 2011-08-23 (×7): 40 mg via ORAL
  Filled 2011-08-16 (×7): qty 1

## 2011-08-16 NOTE — ED Notes (Signed)
A&Ox4, NAD, calm, interactive, skin W&D, resps e/u, speech clear, family x3 at [facility], VSS, attempted urinal, unable to void, CBIR, pain 8/10 pain med given, new IVF bag hung. Phone at [facility] per request.

## 2011-08-16 NOTE — ED Notes (Signed)
Attempted report to floor, RN requested , unable to take report at this time.
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
## 2011-08-16 NOTE — ED Notes (Signed)
Attempted to call report and RN is unavailable at this time.

## 2011-08-16 NOTE — ED Notes (Signed)
Report called to primary receiving RN on 5000, preparing to transport.
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
## 2011-08-16 NOTE — ED Notes (Signed)
Family updated.

## 2011-08-17 ENCOUNTER — Inpatient Hospital Stay (HOSPITAL_COMMUNITY): Payer: Medicare Other

## 2011-08-17 LAB — BASIC METABOLIC PANEL
Chloride: 103 mEq/L (ref 96–112)
GFR calc Af Amer: >90 mL/min (ref >90)
Potassium: 3.7 mEq/L (ref 3.5–5.1)

## 2011-08-17 LAB — URINALYSIS, MICROSCOPIC ONLY
Bilirubin Urine: NEGATIVE
Leukocytes, UA: NEGATIVE
Nitrite: NEGATIVE
Specific Gravity, Urine: >1.046 — ABNORMAL HIGH (ref 1.005–1.030)
pH: 5.5 (ref 5.0–8.0)

## 2011-08-17 LAB — CBC
Platelets: 179 10*3/uL (ref 150–400)
RDW: 14.8 % (ref 11.5–15.5)
WBC: 6.1 10*3/uL (ref 4.0–10.5)

## 2011-08-17 LAB — PROTIME-INR: Prothrombin Time: 31.6 seconds — ABNORMAL HIGH (ref 11.6–15.2)

## 2011-08-17 MED ORDER — MORPHINE SULFATE (PF) 1 MG/ML IV SOLN
INTRAVENOUS | Status: AC
  Filled 2011-08-17: qty 25

## 2011-08-17 NOTE — Progress Notes (Signed)
C T  Scan of right knee shows MCL tear as expected and  No fracture.  MRI not needed.   Knee immobilizer and WBAT

## 2011-08-17 NOTE — H&P (Signed)
Toneshia Coello O. Jaymison Luber, III, MD, FACS [phone] Trauma Surgeon  

## 2011-08-17 NOTE — Progress Notes (Signed)
Physical Therapy/ Occupational Therapy Patient Details Name: Tyrone Mitchell MRN: [id number] DOB: [birth year] Today's Date: 08/17/2011  Will hold PT/OT evals at this time secondary to noted possible R Tibial Plateau fx noted on Radiology Report and Ortho Consult.  Please clarify WBing for R LE and plan for possible fx.  Will check another time.    [facility] 08/17/2011, 8:46 AM  Lucile Shutters MS OTR/L 08/17/2011 8:52AM
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
## 2011-08-17 NOTE — Progress Notes (Signed)
Orthopedic Tech Progress Note Patient Details:  Tyrone Mitchell [birth year] [id number]  Other Ortho Devices Type of Ortho Device: Knee Immobilizer Ortho Device Location: right knee Ortho Device Interventions: Application   [name] 08/17/2011, 8:22 PM

## 2011-08-18 DIAGNOSIS — M7989 Other specified soft tissue disorders: Secondary | ICD-10-CM

## 2011-08-18 DIAGNOSIS — S2239XA Fracture of one rib, unspecified side, initial encounter for closed fracture: Secondary | ICD-10-CM

## 2011-08-18 DIAGNOSIS — Z7901 Long term (current) use of anticoagulants: Secondary | ICD-10-CM | POA: Insufficient documentation

## 2011-08-18 DIAGNOSIS — Z96649 Presence of unspecified artificial hip joint: Secondary | ICD-10-CM | POA: Insufficient documentation

## 2011-08-18 DIAGNOSIS — M238X9 Other internal derangements of unspecified knee: Secondary | ICD-10-CM

## 2011-08-18 DIAGNOSIS — W319XXA Contact with unspecified machinery, initial encounter: Secondary | ICD-10-CM

## 2011-08-18 DIAGNOSIS — M79609 Pain in unspecified limb: Secondary | ICD-10-CM

## 2011-08-18 MED ORDER — MORPHINE SULFATE 4 MG/ML IJ SOLN: 2.0000 mg | INTRAMUSCULAR | Status: DC | PRN

## 2011-08-18 MED ORDER — OXYCODONE HCL 5 MG PO TABS
5.0000 mg | ORAL_TABLET | ORAL | Status: DC | PRN
  Administered 2011-08-18: 5 mg via ORAL
  Administered 2011-08-18 – 2011-08-23 (×19): 10 mg via ORAL
  Filled 2011-08-18 (×20): qty 2

## 2011-08-18 MED ORDER — HYDROCORTISONE ACETATE 25 MG RE SUPP
25.0000 mg | RECTAL | Status: DC
  Administered 2011-08-18 – 2011-08-22 (×3): 25 mg via RECTAL
  Filled 2011-08-18 (×8): qty 1

## 2011-08-18 MED ORDER — FLORA-Q PO CAPS
1.0000 | ORAL_CAPSULE | Freq: Every day | ORAL | Status: DC
  Administered 2011-08-18 – 2011-08-22 (×5): 1 via ORAL
  Filled 2011-08-18 (×7): qty 1

## 2011-08-18 MED ORDER — MORPHINE SULFATE (PF) 1 MG/ML IV SOLN
INTRAVENOUS | Status: AC
  Administered 2011-08-18: 19.5 mg via INTRAVENOUS
  Filled 2011-08-18: qty 25

## 2011-08-18 MED ORDER — ALIGN PO CAPS: 1.0000 | ORAL_CAPSULE | Freq: Every day | ORAL | Status: DC
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
## 2011-08-18 NOTE — Progress Notes (Signed)
Physical Therapy Evaluation Patient Details Name: Tyrone Mitchell MRN: [id number] DOB: 07-22-1941 Today's Date: 08/18/2011  Problem List:  Patient Active Problem List  Diagnoses  . HYPERLIPIDEMIA-MIXED  . TOBACCO ABUSE  . HYPERTENSION, BENIGN  . ATRIAL FIBRILLATION  . ABDOMINAL ANEURYSM WITHOUT MENTION OF RUPTURE    Past Medical History:  Past Medical History  Diagnosis Date  . Atrial fibrillation   . GERD (gastroesophageal reflux disease)   . Hyperlipidemia   . Anemia    Past Surgical History:  Past Surgical History  Procedure Date  . Back surgery   . Hip surgery   . Cholecystectomy   . Prostatectomy     PT Assessment/Plan/Recommendation PT Assessment Clinical Impression Statement: Pt. has recent anterior THR on right (approx. 9 weeks ago) and new injuries including right MCL sprain, multiple rib fxs, and right arm injury involving motor and sensation from being injured by tractor transfer attempt PT Recommendation/Assessment: Patient will need skilled PT in the acute care venue PT Problem List: Decreased strength;Decreased activity tolerance;Decreased balance;Decreased mobility;Decreased knowledge of use of DME;Decreased knowledge of precautions;Impaired sensation;Pain Barriers to Discharge: Decreased caregiver support Barriers to Discharge Comments: lives alone PT Therapy Diagnosis : Difficulty walking;Acute pain PT Plan PT Frequency: Min 6X/week PT Treatment/Interventions: DME instruction;Gait training;Functional mobility training;Therapeutic exercise;Balance training;Patient/family education PT Recommendation Recommendations for Other Services: Rehab consult Follow Up Recommendations: Inpatient Rehab Equipment Recommended: None recommended by PT (pt. already has equipment) PT Goals  Acute Rehab PT Goals PT Goal Formulation: With patient Time For Goal Achievement: 7 days Pt will go Supine/Side to Sit: with min assist PT Goal: Supine/Side to Sit - Progress:  Progressing toward goal Pt will go Sit to Supine/Side: with min assist PT Goal: Sit to Supine/Side - Progress: Progressing toward goal Pt will Transfer Sit to Stand/Stand to Sit: with min assist PT Transfer Goal: Sit to Stand/Stand to Sit - Progress: Progressing toward goal Pt will Transfer Bed to Chair/Chair to Bed: with min assist PT Transfer Goal: Bed to Chair/Chair to Bed - Progress: Progressing toward goal Pt will Stand: with min assist PT Goal: Stand - Progress: Progressing toward goal Pt will Ambulate: 51 - 150 feet;with rolling walker PT Goal: Ambulate - Progress: Progressing toward goal Pt will Perform Home Exercise Program: with supervision, verbal cues required/provided PT Goal: Perform Home Exercise Program - Progress: Progressing toward goal  PT Evaluation Precautions/Restrictions  Precautions Precautions: Anterior Hip;Knee Required Braces or Orthoses: Yes Knee Immobilizer: On when out of bed or walking Restrictions Weight Bearing Restrictions: Yes RLE Weight Bearing: Weight bearing as tolerated Prior Functioning  Home Living Lives With: Alone Type of Home: House Home Layout: Two level;Full bath on main level (second level is a basement) Home Access: Stairs to enter Entrance Stairs-Rails: None Entrance Stairs-Number of Steps: 3 Bathroom Shower/Tub: Tub/shower unit;Walk-in shower Bathroom Toilet: Standard (handicapped height in basement) Home Adaptive Equipment: Walker - rolling;Reacher;Bedside commode/3-in-1;Other (comment) (leg loop) Prior Function Level of Independence: Independent with basic ADLs;Independent with homemaking with ambulation;Independent with homemaking with wheelchair;Independent with gait;Independent with transfers Able to Take Stairs?: Yes Driving: Yes Vocation: Retired Financial risk analyst Overall Cognitive Status: Appears within functional limits for tasks assessed Orientation Level: Oriented X4 Sensation/Coordination Sensation Light  Touch: Impaired Detail (numbness, feels deep pressure) Light Touch Impaired Details: Impaired RUE Coordination Gross Motor Movements are Fluid and Coordinated: No Fine Motor Movements are Fluid and Coordinated: No Extremity Assessment RUE Assessment RUE Assessment: Exceptions to [facility] RUE AROM (degrees) Right Shoulder Flexion  0-170: 170 Degrees Right Shoulder  ABduction 0-140: 90 Degrees RUE Strength Right Shoulder Flexion: 3/5 Gross Grasp: Impaired (decreased flexion 2nd and 3rd digit) LUE Assessment LUE Assessment: Within Functional Limits RLE Assessment RLE Assessment: Exceptions to [facility] RLE Strength RLE Overall Strength: Deficits;Due to pain RLE Overall Strength Comments: hip and knee strenght/ROM not tested due to pain form injury and immobilizer  Right Ankle Dorsiflexion: 4/5 LLE Assessment LLE Assessment: Within Functional Limits Mobility (including Balance) Bed Mobility Bed Mobility: Yes Rolling Left: 1: +2 Total assist (pt. 50%) Left Sidelying to Sit: 1: +2 Total assist Left Sidelying to Sit Details (indicate cue type and reason): pt. 50% Sitting - Scoot to Edge of Bed: 3: Mod assist Sitting - Scoot to Edge of Bed Details (indicate cue type and reason): using bed pad to pull pt, forward Transfers Transfers: Yes Sit to Stand: 1: +2 Total assist Sit to Stand Details (indicate cue type and reason): pt. 50%, vc's for hand placemenn Ambulation/Gait Ambulation/Gait: Yes Ambulation/Gait Assistance: 1: +2 Total assist Ambulation/Gait Assistance Details (indicate cue type and reason): , pt. 70%.  vc's for step lenght esp. on right LE, assist to grasp right handpiece  Ambulation Distance (Feet): 5 Feet Assistive device: Rolling walker Gait Pattern: Step-through pattern;Decreased step length - right;Antalgic Gait velocity: knee immobilizer on right LE Stairs: No Wheelchair Mobility Wheelchair Mobility: No  Balance Balance Assessed: Yes Static Sitting Balance Static Sitting  - Balance Support: Bilateral upper extremity supported;Feet supported Static Sitting - Level of Assistance: 7: Independent Static Standing Balance Static Standing - Balance Support: Bilateral upper extremity supported Static Standing - Level of Assistance: 3: Mod assist Exercise  General Exercises - Lower Extremity Ankle Circles/Pumps: AROM;Both;10 reps End of Session PT - End of Session Equipment Utilized During Treatment: Other (comment) (rolling walker) Activity Tolerance: Patient limited by pain Patient left: in chair Nurse Communication: Mobility status for transfers General Behavior During Session: [facility] for tasks performed Cognition: [facility] for tasks performed  Ferman Hamming 08/18/2011, 10:36 AM

## 2011-08-18 NOTE — Progress Notes (Signed)
  Knee immobilizer on. OK FWB right LE Oder done.     Right  Hand median nerve sensation and motor  Abnormal .  FPL and index  FDP not active . Pos Fromments sign  .      Forearm right compartments soft.    Assessment    Right knee MCL  Grade 3.    If ambulation pain  A problem will consider knee aspiration.  Knee hemarthrosis has been stable and not increased.    Right forearm median nerve  Neropraxia/ axonomesis---  OT ordered.    Tyrone Mitchell

## 2011-08-18 NOTE — Progress Notes (Signed)
CSW completed initial assessment with patient at bedside - located in shadow chart.  Patient agreeable to rehab options - CIR vs. SNF [facility]  CIR to evaluate patient today and CSW to follow up tomorrow.  CSW completed SBIRT.  [address] [place], [place] [phone]

## 2011-08-18 NOTE — Progress Notes (Signed)
Occupational Therapy Evaluation Patient Details Name: Tyrone Mitchell MRN: [id number] DOB: 1941/08/20 Today's Date: 08/18/2011  Problem List:  Patient Active Problem List  Diagnoses  . HYPERLIPIDEMIA-MIXED  . TOBACCO ABUSE  . HYPERTENSION, BENIGN  . ATRIAL FIBRILLATION  . ABDOMINAL ANEURYSM WITHOUT MENTION OF RUPTURE    Past Medical History:  Past Medical History  Diagnosis Date  . Atrial fibrillation   . GERD (gastroesophageal reflux disease)   . Hyperlipidemia   . Anemia    Past Surgical History:  Past Surgical History  Procedure Date  . Back surgery   . Hip surgery   . Cholecystectomy   . Prostatectomy     OT Assessment/Plan/Recommendation OT Assessment Clinical Impression Statement: decreased balance, activity tolerance, decreased mobility OT Recommendation/Assessment: Patient will need skilled OT in the acute care venue OT Problem List: Decreased strength;Decreased activity tolerance;Impaired balance (sitting and/or standing);Pain OT Therapy Diagnosis : Acute pain;Generalized weakness OT Plan OT Frequency: Min 2X/week OT Treatment/Interventions: Self-care/ADL training;DME and/or AE instruction;Therapeutic activities;Balance training;Patient/family education OT Recommendation Recommendations for Other Services: Rehab consult Follow Up Recommendations: Inpatient Rehab Equipment Recommended: None recommended by PT (pt. already has equipment) Individuals Consulted Consulted and Agree with Results and Recommendations: Patient OT Goals Acute Rehab OT Goals OT Goal Formulation: With patient Time For Goal Achievement: 2 weeks ADL Goals Pt Will Perform Grooming: with set-up;Standing at sink Pt Will Perform Upper Body Bathing: with set-up;Sitting, edge of bed Pt Will Perform Lower Body Bathing: with min assist;Sit to stand from bed;with adaptive equipment;Other (comment) (AE PRN) Pt Will Perform Upper Body Dressing: with set-up;Sitting, bed Pt Will Perform Lower  Body Dressing: with min assist;Sit to stand from bed;with adaptive equipment Pt Will Transfer to Toilet: with supervision;Other (comment);Stand pivot transfer (3:1 over toilet) Pt Will Perform Toileting - Clothing Manipulation: with supervision;Standing Pt Will Perform Toileting - Hygiene: with supervision;Sit to stand from 3-in-1/toilet  OT Evaluation Precautions/Restrictions  Precautions Precautions: Anterior Hip;Knee Required Braces or Orthoses: Yes Knee Immobilizer: On when out of bed or walking Restrictions Weight Bearing Restrictions: Yes RLE Weight Bearing: Weight bearing as tolerated Prior Functioning Home Living Lives With: Alone Type of Home: House Home Layout: Two level;Full bath on main level (second level is a basement) Home Access: Stairs to enter Entrance Stairs-Rails: None Entrance Stairs-Number of Steps: 3 Bathroom Shower/Tub: Tub/shower unit;Walk-in shower Bathroom Toilet: Standard (handicapped height in basement) Home Adaptive Equipment: Walker - rolling;Reacher;Bedside commode/3-in-1;Other (comment) (leg loop) Prior Function Level of Independence: Independent with basic ADLs;Independent with homemaking with ambulation;Independent with homemaking with wheelchair;Independent with gait;Independent with transfers Able to Take Stairs?: Yes Driving: Yes Vocation: Retired ADL ADL Eating/Feeding: Performed;Set up Where Assessed - Eating/Feeding: Chair Lower Body Dressing: Performed;+1 Total assistance;Other (comment) (DON socks) Where Assessed - Lower Body Dressing: Sitting, bed;Other (comment) (EOB ) Toilet Transfer: Simulated;+2 Total assistance;Comment for patient % (50%) Toilet Transfer Method: Other (comment) (Stand to sit) Acupuncturist: Other (comment) (Chair level) Equipment Used: Rolling walker;Other (comment) (KI) Vision/Perception  Vision - History Baseline Vision: Wears glasses all the time Patient Visual Report: No change from  baseline Cognition Cognition Overall Cognitive Status: Appears within functional limits for tasks assessed Orientation Level: Oriented X4 Sensation/Coordination Sensation Light Touch: Impaired Detail (numbness, feels deep pressure) Light Touch Impaired Details: Impaired RUE Coordination Gross Motor Movements are Fluid and Coordinated: No Fine Motor Movements are Fluid and Coordinated: No Extremity Assessment RUE Assessment RUE Assessment: Exceptions to [facility] RUE AROM (degrees) Right Shoulder Flexion  0-170: 170 Degrees Right Shoulder ABduction 0-140: 90 Degrees  RUE Strength Right Shoulder Flexion: 3/5 Gross Grasp: Impaired (decreased flexion 2nd and 3rd digit) LUE Assessment WFL LUE Assessment: Within Functional Limits Mobility  Bed Mobility Bed Mobility: Yes Rolling Left: 1: +2 Total assist (pt. 50%) Left Sidelying to Sit: 1: +2 Total assist Left Sidelying to Sit Details (indicate cue type and reason): pt. 50% Sitting - Scoot to Edge of Bed: 3: Mod assist Sitting - Scoot to Edge of Bed Details (indicate cue type and reason): using bed pad to pull pt, forward Transfers Transfers: Yes Sit to Stand: 1: +2 Total assist Sit to Stand Details (indicate cue type and reason): pt. 50%, vc's for hand placemenn End of Session OT - End of Session Equipment Utilized During Treatment: Gait belt;Other (comment) (rw) Activity Tolerance: Patient limited by pain (pt nauseate with mobility and static standing) Patient left: in chair;with call bell in reach;with family/visitor present Nurse Communication: Mobility status for transfers;Mobility status for ambulation General Behavior During Session: [facility] for tasks performed Cognition: [facility] for tasks performed   Otis Peak 08/18/2011, 11:42 AM   08/18/2011 Lucile Shutters MS OTR/L 12:55PM

## 2011-08-18 NOTE — Progress Notes (Signed)
*  PRELIMINARY RESULTS* Bilateral lower venous dopplers performed. No obvious evidence of DVT, superficial thrombus or Bakers cysts.  Llewelyn, [place] 08/18/2011, 3:44 PM

## 2011-08-18 NOTE — Progress Notes (Signed)
  Subjective: Pt. C/o nausea with some minimal emesis, but not able to eat very much. Reports significant hx of constipation and had to take a probiotic at home to remain regular. He also reports painful internal hemorrhoids if he get constipated.  His pain is under fairly good control.   Objective: Vital signs in last 24 hours: Temp:  [97.8 F (36.6 C)-98.8 F (37.1 C)] 98.4 F (36.9 C) (11/07 0550) Pulse Rate:  [81-85] 83  (11/07 0550) Resp:  [18-20] 18  (11/07 0550) BP: (110-121)/(58-67) 117/58 mmHg (11/07 0550) SpO2:  [2 %-99 %] 98 % (11/07 0550)  Intake/Output from previous day: 11/06 0701 - 11/07 0700 In: 1960.4 [P.O.:240; I.V.:1718.4; IV Piggyback:2] Out: 700 [Urine:700] Intake/Output this shift:     Basename 08/17/11 0610 08/16/11 1531 08/16/11 1506  HGB 9.6* 12.9* 12.3*    Basename 08/17/11 0610 08/16/11 1531 08/16/11 1506  WBC 6.1 -- 17.3*  RBC 3.16* -- 3.94*  HCT 28.8* 38.0* --  PLT 179 -- 205    Basename 08/17/11 0610 08/16/11 1531 08/16/11 1506  NA 137 141 --  K 3.7 4.2 --  CL 103 105 --  CO2 25 -- 25  BUN 14 18 --  CREATININE 0.90 1.00 --  GLUCOSE 127* 182* --  CALCIUM 8.5 -- 9.3    Basename 08/17/11 0610 08/16/11 1506  LABPT -- --  INR 3.00* 2.58*    Neurologically intact ABD soft Neurovascular intact Intact pulses distally Dorsiflexion/Plantar flexion intact Right leg is edematous and slightly pale, but good pulse and sensation is intact Right hand continues to have some motor and sensory deficits in the radial nerve distribution  Assessment/Plan: Impression: Tractor vs peds accident Multiple Right Rib fxs Right arm crush injury with radial nerve deficit Right knee medial collateral ligamentout inury Recent Left THR   PLAN: Resume probiotic, on clears ,but will not advance yet due to N/V. Also, give anusol supp today.   Right leg is more edematous , so will check venous dopplers.  Therapies request CIR Consult as pt lives alone  and will not be able to DC home alone at current functional level.  follow up labs in am.   Chelan Heringer 08/18/2011, 10:56 AM

## 2011-08-19 ENCOUNTER — Inpatient Hospital Stay (HOSPITAL_COMMUNITY): Payer: Medicare Other

## 2011-08-19 LAB — BASIC METABOLIC PANEL
Calcium: 8.5 mg/dL (ref 8.4–10.5)
GFR calc non Af Amer: 87 mL/min — ABNORMAL LOW (ref >90)
Sodium: 132 mEq/L — ABNORMAL LOW (ref 135–145)

## 2011-08-19 LAB — PROTIME-INR
INR: 1.82 — ABNORMAL HIGH (ref 0.00–1.49)
Prothrombin Time: 21.4 seconds — ABNORMAL HIGH (ref 11.6–15.2)

## 2011-08-19 LAB — DIFFERENTIAL
Basophils Absolute: 0 10*3/uL (ref 0.0–0.1)
Basophils Relative: 0 % (ref 0–1)
Eosinophils Absolute: 0.1 10*3/uL (ref 0.0–0.7)
Eosinophils Relative: 2 % (ref 0–5)

## 2011-08-19 LAB — CBC
HCT: 22.3 % — ABNORMAL LOW (ref 39.0–52.0)
MCH: 30.8 pg (ref 26.0–34.0)
MCHC: 32.7 g/dL (ref 30.0–36.0)
MCV: 92.1 fL (ref 78.0–100.0)
Platelets: 135 10*3/uL — ABNORMAL LOW (ref 150–400)
RDW: 14.6 % (ref 11.5–15.5)
RDW: 14.6 % (ref 11.5–15.5)
WBC: 7 10*3/uL (ref 4.0–10.5)

## 2011-08-19 MED ORDER — BISACODYL 10 MG RE SUPP
10.0000 mg | Freq: Once | RECTAL | Status: AC
  Administered 2011-08-19: 10 mg via RECTAL
  Filled 2011-08-19: qty 1

## 2011-08-19 MED ORDER — MAGNESIUM HYDROXIDE 400 MG/5ML PO SUSP
30.0000 mL | Freq: Every day | ORAL | Status: DC
  Administered 2011-08-19 – 2011-08-23 (×4): 30 mL via ORAL
  Filled 2011-08-19 (×5): qty 30

## 2011-08-19 MED ORDER — TRAMADOL HCL 50 MG PO TABS
50.0000 mg | ORAL_TABLET | Freq: Four times a day (QID) | ORAL | Status: DC
  Administered 2011-08-19 – 2011-08-23 (×16): 50 mg via ORAL
  Filled 2011-08-19 (×16): qty 1

## 2011-08-19 MED ORDER — IOHEXOL 300 MG/ML  SOLN
75.0000 mL | Freq: Once | INTRAMUSCULAR | Status: AC | PRN
  Administered 2011-08-19: 75 mL via INTRAVENOUS

## 2011-08-19 MED ORDER — CYCLOBENZAPRINE HCL 10 MG PO TABS
10.0000 mg | ORAL_TABLET | Freq: Three times a day (TID) | ORAL | Status: DC | PRN
  Administered 2011-08-21 – 2011-08-23 (×7): 10 mg via ORAL
  Filled 2011-08-19 (×7): qty 1

## 2011-08-19 NOTE — Progress Notes (Signed)
CSW initiated SNF search in [place].  Patient lives alone with limited support during the day due to son's work schedule.  CSW will follow up with patient in regards to possible bed offers once available.  Per MD, patient may not be medically stable for dc until early next week.  CSW available for support as needed.  CSW to facilitate patient dc needs once medically ready.  [address] [place], [place] [phone]

## 2011-08-19 NOTE — Progress Notes (Signed)
Evaluated for possible admission.  Spoke with patient.  He lives alone.  He does have sons, but they work.  He does not have adequate supervision at home after a short rehab stay.  I feel he will need skilled nursing facility.  He lives in [facility] and prefers to find a facility there.  If he comes up with more supervision at home, we can look at inpatient rehab here at [address], but at this point he does not have supervision at home.

## 2011-08-19 NOTE — Progress Notes (Signed)
Physical Therapy Treatment Patient Details Name: Tyrone Mitchell MRN: [id number] DOB: [birth year] Today's Date: 08/19/2011  PT Assessment/Plan   PT Goals  Acute Rehab PT Goals PT Goal: Supine/Side to Sit - Progress: Progressing toward goal Pt will go Sit to Supine/Side:  (not attempted) PT Transfer Goal: Sit to Stand/Stand to Sit - Progress: Progressing toward goal PT Transfer Goal: Bed to Chair/Chair to Bed - Progress: Progressing toward goal PT Goal: Stand - Progress: Progressing toward goal PT Goal: Ambulate - Progress: Progressing toward goal PT Goal: Perform Home Exercise Program - Progress: Progressing toward goal  PT Treatment Precautions/Restrictions  Precautions Precautions: Anterior Hip;Knee Required Braces or Orthoses: Yes Knee Immobilizer: On when out of bed or walking Restrictions Weight Bearing Restrictions: Yes RLE Weight Bearing: Weight bearing as tolerated Mobility (including Balance) Bed Mobility Bed Mobility: Yes Left Sidelying to Sit: 3: Mod assist Left Sidelying to Sit Details (indicate cue type and reason): for right LE and for upper body; needed vc's for technique for UE usage and minimizing pain Transfers Sit to Stand: 1: +2 Total assist;From bed Sit to Stand Details (indicate cue type and reason): vc's for hand placement  and safe technique Ambulation/Gait Ambulation/Gait: Yes Ambulation/Gait Assistance: 1: +2 Total assist;Patient percentage (comment) (pt. 70%) Ambulation/Gait Assistance Details (indicate cue type and reason): pt. inconsistent in gait sequence; needs cues for consistent step length, sequence, for upright posture Ambulation Distance (Feet): 12 Feet Assistive device: Rolling walker Gait Pattern: Step-through pattern;Decreased step length - left  Static Standing Balance Static Standing - Level of Assistance: 3: Mod assist (while pt. managing urinal) Exercise  General Exercises - Lower Extremity Ankle Circles/Pumps: AROM;Both;15  reps End of Session PT - End of Session Equipment Utilized During Treatment: Gait belt (rolling walker) Activity Tolerance: Patient limited by fatigue;Patient limited by pain Patient left: in chair;with call bell in reach Nurse Communication: Mobility status for transfers;Mobility status for ambulation (advised RN pt. not yet able to amb. to BR, recommend BSC) General Behavior During Session: [facility] for tasks performed Cognition: [facility] for tasks performed  Ferman Hamming 08/19/2011, 10:40 AM

## 2011-08-19 NOTE — Progress Notes (Signed)
  Slight improvement in right median nerve  Sensation subjectively but still no 2 point.  Mobility a problem. Lives alone.  Knee immobilizer on at all times when up Right. Due to MCL injury.  No median motor in hand .   OT ordered.

## 2011-08-19 NOTE — Progress Notes (Signed)
   Subjective: Pt c/o continued constipation and bloating. He did not have any results with the suppository yesterday and is not passing much flatus.  His pain is not under as good control off the PCA, but he was also taking Flexeril daily after his hip replacement.   Objective: Vital signs in last 24 hours: Temp:  [97.8 F (36.6 C)-99.5 F (37.5 C)] 97.8 F (36.6 C) (11/08 0539) Pulse Rate:  [70-89] 70  (11/08 0539) Resp:  [12-18] 18  (11/08 0539) BP: (119-126)/(58-72) 119/66 mmHg (11/08 0539) SpO2:  [92 %-93 %] 92 % (11/08 0539)  Intake/Output from previous day: 11/07 0701 - 11/08 0700 In: 240 [P.O.:240] Out: 300 [Urine:300] Intake/Output this shift:     Basename 08/19/11 0547 08/17/11 0610 08/16/11 1531 08/16/11 1506  HGB 7.8* 9.6* 12.9* 12.3*    Basename 08/19/11 0547 08/17/11 0610  WBC 7.0 6.1  RBC 2.53* 3.16*  HCT 23.3* 28.8*  PLT 135* 179    Basename 08/19/11 0547 08/17/11 0610  NA 132* 137  K 3.7 3.7  CL 100 103  CO2 25 25  BUN 11 14  CREATININE 0.85 0.90  GLUCOSE 127* 127*  CALCIUM 8.5 8.5    Basename 08/17/11 0610 08/16/11 1506  LABPT -- --  INR 3.00* 2.58*    VENOUS US of L/E- 11/7 negative for DVT PE: Neurologically intact Neurovascular intact Intact pulses distally Dorsiflexion/Plantar flexion intact ABD is moderately distended, but non-tender to palpation, minimal bruising over abd wall Right hand continues to have some motor and sensory deficits in the radial nerve distribution Lungs- Decreased BS in R base, otherwise clear.  Assessment/Plan: Impression: Tractor vs peds accident Multiple Right Rib fxs Right arm crush injury with radial nerve deficit Right knee medial collateral ligamentout inury Recent Left THR ABL Anemia Ileus after trauma  PLAN: Mult R rib fx- ? Hemothorax, with continued decline in hgb- ck CXR R arm crush injury- stable exam R MCL injury- conservative Rx FEN- ? Ileus- ck KUB, continue clears, required  laxatives at home at times, MOM today and Dulc supp  ABL anemia- Re check labs later today Elevated INR- ck with lab later today VTE-SCD's, ? Lovenox once stable hgb and INR DISP- Not a rehab candidate as lacks supports for DC to home.  Will need temporary SNF   RAYBURN,SHAWN 08/19/2011, 9:51 AM    The patient does not have a significant hemothorax to explain his drop in hemoglobin.  His drop more than likely is coming fromt he bleeding in his RLE; however, I cannot rule out that the problem is not coming form his abdomen.  He is having some abdominal discomfort, and his abdomen is distended.  Hypoactive BSs.  Never had a formal CT of the abdomen and pelvis.  Will do so today.  This patient has been seen and I agree with the findings and treatment plan.  Marta Lamas. Gae Bon, MD, FACS [phone] (pager) [phone] (direct pager) Trauma Surgeon

## 2011-08-19 NOTE — Progress Notes (Signed)
08/19/2011  Assessment of R hand:   Median Nerve Assessment:     Abductor pollicis brevis- positive result- affected (unable to raise thumb to ceiling with hand supinated) Flexor pollicis brevis- negative result- not affected (able to flex thumb to palm) Opponens pollicis- positive result- affected (unable to place pad to pad thumb to little finger) Lateral two lumbricals- positive result- affected (able to flex 3rd digit at PIP and DIP, however unable to flex 2nd digit at PIP or DIP)  Radial Nerve Assessment: Positive result on dorsal aspect of hand 2nd and 3rd DIP, PIP, MCP : Numbness Lateral aspect of dorsal side of thumb- positive result- numbness  Pt currently with extended 2nd digit and thumb in neutral position. Pt demonstrations flexion and extension of 3rd 4th and 5th digits.   Pt with median nerve assessment positive for median nerve injury. Pt also demonstrations radial nerve distribution injury on the dorsal aspect of the R hand.  OT to monitor for splinting needs during acute care treatment. Pt educated on reason for assessment of median nerve. Pt educated that OT to continue to monitor RUE.  Harrel Carina [facility]  MS OTR/L Pager [phone]     11:34AM

## 2011-08-20 LAB — CBC
MCH: 31 pg (ref 26.0–34.0)
Platelets: 154 10*3/uL (ref 150–400)
RBC: 2.42 MIL/uL — ABNORMAL LOW (ref 4.22–5.81)
RDW: 14.5 % (ref 11.5–15.5)
WBC: 5.8 10*3/uL (ref 4.0–10.5)

## 2011-08-20 MED ORDER — MORPHINE SULFATE 2 MG/ML IJ SOLN: 2.0000 mg | INTRAMUSCULAR | Status: DC | PRN

## 2011-08-20 NOTE — Progress Notes (Signed)
CSW continuing to follow patient for dc planning needs.  CSW provided patient with bed offers in [facility] and explained the possible co-pays that may follow at the facilities.  CSW to follow up with patient for bed choice.  Per MD, patient will be ready for dc early next week.  CSW to facilitate patient dc needs once medically stable.  [address] [place], [place] [phone]

## 2011-08-20 NOTE — Progress Notes (Signed)
Subjective: Sore R ribs and RLE, had small BM overnight, tol clears  Objective: Vital signs in last 24 hours: Temp:  [97.2 F (36.2 C)-98.8 F (37.1 C)] 98.8 F (37.1 C) (11/09 0553) Pulse Rate:  [85-134] 85  (11/09 0553) Resp:  [18] 18  (11/09 0553) BP: (111-112)/(58-68) 112/65 mmHg (11/09 0553) SpO2:  [94 %-96 %] 94 % (11/09 0553) Last BM Date: 08/16/11  Intake/Output from previous day: 11/08 0701 - 11/09 0700 In: 480 [P.O.:480] Out: 600 [Urine:600] Intake/Output this shift:    Up in chair A&O Lungs CTA, IS 1500 CV irreg ABD soft, not sig dist, some BS, NT RLE knee immobilizer  Lab Results: CBC   Basename 08/19/11 1019 08/19/11 0547  WBC 5.9 7.0  HGB 7.3* 7.8*  HCT 22.3* 23.3*  PLT 137* 135*   BMET  Basename 08/19/11 0547  NA 132*  K 3.7  CL 100  CO2 25  GLUCOSE 127*  BUN 11  CREATININE 0.85  CALCIUM 8.5   PT/INR  Basename 08/19/11 1019  LABPROT 21.4*  INR 1.82*   ABG No results found for this basename: PHART:2,PCO2:2,PO2:2,HCO3:2 in the last 72 hours  Studies/Results: Ct Abdomen Pelvis W Contrast  08/19/2011  *RADIOLOGY REPORT*  Clinical Data: Abdominal pain, fall in hemoglobin, post trauma, a tractor rolled over the patient  CT ABDOMEN AND PELVIS WITH CONTRAST  Technique:  Multidetector CT imaging of the abdomen and pelvis was performed following the standard protocol during bolus administration of intravenous contrast. Sagittal and coronal MPR images reconstructed from axial data set.  Contrast: 75mL OMNIPAQUE IOHEXOL 300 MG/ML IV SOLN Dilute oral contrast.  Comparison: 08/16/2011  Findings: Small right pleural effusion. Minimal bibasilar atelectasis. Gallbladder surgically absent. Liver, spleen, pancreas, kidneys, and adrenal glands normal appearance. Atherosclerotic changes with aneurysmal dilatation of the infrarenal abdominal aorta, up to 3.3 x 3.0 cm image 42. Beam hardening artifacts from right hip prosthesis. Normal-appearing bladder.   Small amount of nonspecific free pelvic fluid. Enlarged left internal inguinal ring without discrete hernia. Stomach and bowel loops normal appearance. No mass, adenopathy or free air. Scattered subcutaneous edema at the flanks and pelvis. Prior L3-L4 fusion with incomplete incorporation of the bone plug.  IMPRESSION: Small right pleural effusion with minimal bibasilar atelectasis. Abdominal aortic aneurysm 3.3 x 3.0 cm greatest size. Small amount of nonspecific free intraperitoneal fluid. No additional intra abdominal or intrapelvic abnormalities.  Original Report Authenticated By: Lollie Marrow, M.D.   Dg Chest Portable 1 View  08/19/2011  *RADIOLOGY REPORT*  Clinical Data: Fall, multiple rib fractures.  PORTABLE CHEST - 1 VIEW  Comparison:  CT 08/16/2011  Findings: The right lateral seventh rib fracture can be visualized by plain films.  The remainder of the rib fractures are not visualized.  No pneumothorax.  There are are linear bibasilar densities, left greater than might, likely atelectasis.  No effusions. Heart is upper limits normal in size.  IMPRESSION: Right rib fracture as above.  Bibasilar atelectasis.  No pneumothorax.  Original Report Authenticated By: Cyndie Chime, M.D.   Dg Abd Portable 1v  08/19/2011  *RADIOLOGY REPORT*  Clinical Data: Ileus, abdominal pain, nausea, vomiting.  ABDOMEN - 1 VIEW  Comparison: 08/16/2011 CT  Findings: Mild gaseous distention of the colon, likely mild ileus. Moderate stool burden.  Gas within slightly prominent central abdominal small bowel loops.  No supine evidence of free air.  No organomegaly or acute bony abnormality.  Prior right hip replacement.  IMPRESSION: Suspect mild ileus.  Original Report Authenticated By: Cyndie Chime, M.D.    Anti-infectives: Anti-infectives    None      Assessment/Plan: Patient Active Problem List  Diagnoses  . HYPERLIPIDEMIA-MIXED  . TOBACCO ABUSE  . HYPERTENSION, BENIGN  . ATRIAL FIBRILLATION  . ABDOMINAL  ANEURYSM WITHOUT MENTION OF RUPTURE  . Accident caused by unspecified machinery  . Multiple closed fractures of ribs of right side  . Crush injury elbow/forearm  . Injury of radial nerve at right upper arm level  . Tear of medial collateral ligament of knee  . Status post THR (total hip replacement)  . Warfarin anticoagulation   Ped V Tractor 1. Mult R rib FX - pulm toilet 2. RUE crush injury 3. Mult R rib fx- ? Hemothorax, with continued decline in hgb- ck CXR 4. R MCL injury- conservative Rx 5. FEN- ileus improving - try fulls  6. ABL anemia- check CBC now 7. Elevated INR- check INR now 8. VTE-SCD's, ? Lovenox once stable hgb and INR 9. DISP- Not a rehab candidate as lacks supports for DC to home.  Will need temporary SNF    LOS: 4 days    Annistyn Depass E 08/20/2011

## 2011-08-20 NOTE — Progress Notes (Signed)
Physical Therapy Treatment Patient Details Name: Tyrone Mitchell MRN: [id number] DOB: 05-10-1941 Today's Date: 08/20/2011  PT Assessment/Plan  PT - Assessment/Plan Comments on Treatment Session: Pt. making gradual porgress but is clearly not safe for return home alone at this point. Continue to recommend inpatient therapy post-acute. PT Plan: Discharge plan remains appropriate PT Frequency: Min 6X/week Follow Up Recommendations: Inpatient Rehab Equipment Recommended: Defer to next venue PT Goals  Acute Rehab PT Goals PT Transfer Goal: Sit to Stand/Stand to Sit - Progress: Progressing toward goal PT Goal: Ambulate - Progress: Progressing toward goal PT Goal: Perform Home Exercise Program - Progress: Progressing toward goal  PT Treatment Precautions/Restrictions  Precautions Precautions: Fall Required Braces or Orthoses: Yes Knee Immobilizer: On when out of bed or walking Restrictions Weight Bearing Restrictions: Yes RLE Weight Bearing: Weight bearing as tolerated Mobility (including Balance) Transfers Transfers: Yes Sit to Stand: 1: +2 Total assist;Patient percentage (comment);From chair/3-in-1 (pt. 70%) Sit to Stand Details (indicate cue type and reason): 2nd person primarily for safety Stand to Sit: 3: Mod assist;To chair/3-in-1 Stand to Sit Details: hand placement and safe technique Ambulation/Gait Ambulation/Gait: Yes Ambulation/Gait Assistance: 4: Min assist (second person to push recliner behind for safety) Ambulation/Gait Assistance Details (indicate cue type and reason): pt. needed to rest while standing, needs cues for safety and gait sequence Ambulation Distance (Feet): 25 Feet Assistive device: Rolling walker Gait Pattern:  (vc's for sequence and step lenght)    Exercise  General Exercises - Lower Extremity Ankle Circles/Pumps: AROM;Both;10 reps End of Session PT - End of Session Equipment Utilized During Treatment: Gait belt (rolling walker) Activity  Tolerance: Patient limited by fatigue;Patient limited by pain (tires easily, Hgb 7.3) Patient left: in chair General Behavior During Session: [facility] for tasks performed (seems discouraged) Cognition: WFL for tasks performed  Ferman Hamming 08/20/2011, 12:08 PM

## 2011-08-20 NOTE — Progress Notes (Signed)
Occupational Therapy Treatment Patient Details Name: Tyrone Mitchell MRN: [id number] DOB: 06/10/1941 Today's Date: 08/20/2011  OT Assessment/Plan OT Assessment/Plan Comments on Treatment Session: Educated pt. on discharge plan and recommendations to ease mind about cost of therapy at d/c. Pt was anxious about having to pay alot of money and therapist educated pt on reaching out to church family and other family to possibly hosting a fundraiser to help with medical bills. Recommended to pt to be actively involved in his plan of care and ask for updates about progress towards goals.  OT Plan: Discharge plan remains appropriate;Frequency remains appropriate OT Frequency: Min 2X/week Follow Up Recommendations: Skilled nursing facility Equipment Recommended: Defer to next venue OT Goals ADL Goals Pt Will Perform Grooming: with set-up;Standing at sink ADL Goal: Grooming - Progress: Progressing toward goals Pt Will Perform Upper Body Bathing: with set-up;Sitting, edge of bed ADL Goal: Upper Body Bathing - Progress: Progressing toward goals Pt Will Perform Lower Body Bathing: with min assist;Sit to stand from bed;with adaptive equipment;Other (comment) ADL Goal: Lower Body Bathing - Progress: Progressing toward goals Pt Will Perform Upper Body Dressing: with set-up;Sitting, bed ADL Goal: Upper Body Dressing - Progress: Progressing toward goals Pt Will Perform Lower Body Dressing: with min assist;Sit to stand from bed;with adaptive equipment ADL Goal: Lower Body Dressing - Progress: Progressing toward goals Pt Will Transfer to Toilet: with supervision;Other (comment);Stand pivot transfer ADL Goal: Toilet Transfer - Progress: Progressing toward goals Pt Will Perform Toileting - Clothing Manipulation: with supervision;Standing ADL Goal: Toileting - Clothing Manipulation - Progress: Progressing toward goals Pt Will Perform Toileting - Hygiene: with supervision;Sit to stand from 3-in-1/toilet ADL  Goal: Toileting - Hygiene - Progress: Progressing toward goals Arm Goals Pt Will Perform AROM: Independently;to maintain range of motion;Right upper extremity;10 reps;3 sets;Other (comment) (opposition and digit flex, ext, abduction, & adduction) Arm Goal: AROM - Progress: Progressing toward goal Pt Will Complete Theraputty Exer: Independently;to increase strength;Right upper extremity;Other (comment) (for right hand manipulation and digit flex, ext, abd, add.) Arm Goal: Theraputty Exercises - Progress: Progressing toward goal  OT Treatment Precautions/Restrictions  Precautions Precautions: Fall Required Braces or Orthoses: Yes Knee Immobilizer: On when out of bed or walking Restrictions Weight Bearing Restrictions: Yes RLE Weight Bearing: Weight bearing as tolerated   ADL ADL Eating/Feeding: Performed;Other (comment);Modified independent (Required occasional vc for encouragement to use right hand) Eating/Feeding Details (indicate cue type and reason): Educated pt re; self-feeding compensatory strategies for holding spoon with right hand independently Where Assessed - Eating/Feeding: Chair Grooming: Wash/dry face;Performed;Set up Where Assessed - Grooming: Sitting, chair Equipment Used: Other (comment) (none) ADL Comments: Instructed pt in using right hand to open food containers in prep for eating and using right hand to hold spoon. Educated pt re; compensatory strategy of weaving spoon between fingers and using tripod grip with thumb under spoon for support in order to use right hand for self-feeding.    Mobility  Transfers Sit to Stand: 1: +2 Total assist;Patient percentage (comment);From chair/3-in-1 (pt. 70%) Sit to Stand Details (indicate cue type and reason): 2nd person primarily for safety Stand to Sit: 3: Mod assist;To chair/3-in-1 Stand to Sit Details: hand placement and safe technique Exercises Hand Exercises Digit Composite Flexion: 10 reps;Other  (comment);AROM;Right;Seated (make a fist hold for 10 seconds with thumb over index finger) Composite Extension: 10 reps;Other (comment);AROM;Right ((open/close fist) open hand fully extended for 10 seconds) Digit Composite Abduction: 10 reps;Seated;Other (comment);AROM;Strengthening;Right (actively push 2-3 digit against item holding for 10 seconds) Digit Composite Adduction: 10  reps;AROM;Strengthening;Right;Seated;Other (comment) (actively squeeze 2-3 digits together holding for 10 seconds) Opposition: 10 reps;AROM;Strengthening;Right;Other (comment);Seated (down to pinky and back to index to complete 1 rep)  End of Session OT - End of Session Activity Tolerance: Patient tolerated treatment well Patient left: in chair;with call bell in reach General Behavior During Session: [facility] for tasks performed (seems discouraged) Cognition: WFL for tasks performed  Otis Peak OTS 08/20/2011, 1:53 PM  08/20/2011 Lucile Shutters   OTR/L Pager: [phone] Office: [phone] .

## 2011-08-21 NOTE — Progress Notes (Signed)
Subjective: Sore R ribs and RLE, had small BM overnight, tol fulls  Objective: Vital signs in last 24 hours: Temp:  [97.2 F (36.2 C)-98.8 F (37.1 C)] 98.8 F (37.1 C) (11/09 0553) Pulse Rate:  [85-134] 85  (11/09 0553) Resp:  [18] 18  (11/09 0553) BP: (111-112)/(58-68) 112/65 mmHg (11/09 0553) SpO2:  [94 %-96 %] 94 % (11/09 0553) Last BM Date: 08/16/11  Intake/Output from previous day: 11/08 0701 - 11/09 0700 In: 480 [P.O.:480] Out: 600 [Urine:600] Intake/Output this shift:    Up in chair A&O Lungs CTA CV irreg ABD soft, not sig dist, some BS, NT RLE knee immobilizer  Lab Results: CBC   Basename 08/19/11 1019 08/19/11 0547  WBC 5.9 7.0  HGB 7.3* 7.8*  HCT 22.3* 23.3*  PLT 137* 135*   BMET  Basename 08/19/11 0547  NA 132*  K 3.7  CL 100  CO2 25  GLUCOSE 127*  BUN 11  CREATININE 0.85  CALCIUM 8.5   PT/INR  Basename 08/19/11 1019  LABPROT 21.4*  INR 1.82*   ABG No results found for this basename: PHART:2,PCO2:2,PO2:2,HCO3:2 in the last 72 hours  Studies/Results: Ct Abdomen Pelvis W Contrast  08/19/2011  *RADIOLOGY REPORT*  Clinical Data: Abdominal pain, fall in hemoglobin, post trauma, a tractor rolled over the patient  CT ABDOMEN AND PELVIS WITH CONTRAST  Technique:  Multidetector CT imaging of the abdomen and pelvis was performed following the standard protocol during bolus administration of intravenous contrast. Sagittal and coronal MPR images reconstructed from axial data set.  Contrast: 75mL OMNIPAQUE IOHEXOL 300 MG/ML IV SOLN Dilute oral contrast.  Comparison: 08/16/2011  Findings: Small right pleural effusion. Minimal bibasilar atelectasis. Gallbladder surgically absent. Liver, spleen, pancreas, kidneys, and adrenal glands normal appearance. Atherosclerotic changes with aneurysmal dilatation of the infrarenal abdominal aorta, up to 3.3 x 3.0 cm image 42. Beam hardening artifacts from right hip prosthesis. Normal-appearing bladder.  Small  amount of nonspecific free pelvic fluid. Enlarged left internal inguinal ring without discrete hernia. Stomach and bowel loops normal appearance. No mass, adenopathy or free air. Scattered subcutaneous edema at the flanks and pelvis. Prior L3-L4 fusion with incomplete incorporation of the bone plug.  IMPRESSION: Small right pleural effusion with minimal bibasilar atelectasis. Abdominal aortic aneurysm 3.3 x 3.0 cm greatest size. Small amount of nonspecific free intraperitoneal fluid. No additional intra abdominal or intrapelvic abnormalities.  Original Report Authenticated By: Lollie Marrow, M.D.   Dg Chest Portable 1 View  08/19/2011  *RADIOLOGY REPORT*  Clinical Data: Fall, multiple rib fractures.  PORTABLE CHEST - 1 VIEW  Comparison:  CT 08/16/2011  Findings: The right lateral seventh rib fracture can be visualized by plain films.  The remainder of the rib fractures are not visualized.  No pneumothorax.  There are are linear bibasilar densities, left greater than might, likely atelectasis.  No effusions. Heart is upper limits normal in size.  IMPRESSION: Right rib fracture as above.  Bibasilar atelectasis.  No pneumothorax.  Original Report Authenticated By: Cyndie Chime, M.D.   Dg Abd Portable 1v  08/19/2011  *RADIOLOGY REPORT*  Clinical Data: Ileus, abdominal pain, nausea, vomiting.  ABDOMEN - 1 VIEW  Comparison: 08/16/2011 CT  Findings: Mild gaseous distention of the colon, likely mild ileus. Moderate stool burden.  Gas within slightly prominent central abdominal small bowel loops.  No supine evidence of free air.  No organomegaly or acute bony abnormality.  Prior right hip replacement.  IMPRESSION: Suspect mild ileus.  Original  Report Authenticated By: Cyndie Chime, M.D.    Anti-infectives: Anti-infectives    None      Assessment/Plan: Patient Active Problem List  Diagnoses  . HYPERLIPIDEMIA-MIXED  . TOBACCO ABUSE  . HYPERTENSION, BENIGN  . ATRIAL FIBRILLATION  . ABDOMINAL ANEURYSM  WITHOUT MENTION OF RUPTURE  . Accident caused by unspecified machinery  . Multiple closed fractures of ribs of right side  . Crush injury elbow/forearm  . Injury of radial nerve at right upper arm level  . Tear of medial collateral ligament of knee  . Status post THR (total hip replacement)  . Warfarin anticoagulation   Ped V Tractor 1. Mult R rib FX - pulm toilet 2. RUE crush injury 3. Mult R rib fx- ? Hemothorax, with continued decline in hgb- ck CXR 4. R MCL injury- conservative Rx 5. FEN- ileus improving - try regular diet 6. ABL anemia- check CBC now 7. Elevated INR- check INR now 8. VTE-SCD's, ? Lovenox once stable hgb and INR 9. DISP- Not a rehab candidate as lacks supports for DC to home.  Will need temporary SNF    LOS: 5 days    Barnetta Chapel, [facility] 08-21-11

## 2011-08-21 NOTE — Progress Notes (Signed)
Subjective: 'My hand is a little better'   Objective: Vital signs in last 24 hours: Temp:  [97.7 F (36.5 C)-98 F (36.7 C)] 98 F (36.7 C) (11/10 0622) Pulse Rate:  [70-85] 70  (11/10 0622) Resp:  [18-20] 18  (11/10 0622) BP: (97-126)/(61-70) 115/70 mmHg (11/10 0622) SpO2:  [95 %-96 %] 95 % (11/10 0622)  Intake/Output from previous day: 11/09 0701 - 11/10 0700 In: 600 [P.O.:600] Out: 2285 [Urine:2285] Intake/Output this shift:     Basename 08/20/11 0942 08/19/11 1019 08/19/11 0547  HGB 7.5* 7.3* 7.8*    Basename 08/20/11 0942 08/19/11 1019  WBC 5.8 5.9  RBC 2.42* 2.40*  HCT 22.2* 22.3*  PLT 154 137*    Basename 08/19/11 0547  NA 132*  K 3.7  CL 100  CO2 25  BUN 11  CREATININE 0.85  GLUCOSE 127*  CALCIUM 8.5    Basename 08/20/11 0942 08/19/11 1019  LABPT -- --  INR 1.63* 1.82*    no fpl or fdp to thumb or index on right  sens subjectively improved long, r ki in place foot mobile perfused and sensate  Assessment/Plan: Keep ki on for amb/ ot for hand pt notes gradual subjective improvement   Tyrone Mitchell 08/21/2011, 7:21 AM

## 2011-08-21 NOTE — Progress Notes (Signed)
Physical Therapy Treatment Patient Details Name: ARSHDEEP BOLGER MRN: [id number] DOB: 03-29-1941 Today's Date: 08/21/2011  PT Assessment/Plan  PT - Assessment/Plan Comments on Treatment Session: Pt slowly progressing. Declined bed mobility practice.  PT Plan: Discharge plan remains appropriate PT Frequency: Min 6X/week Follow Up Recommendations: Inpatient Rehab Equipment Recommended: Defer to next venue PT Goals  Acute Rehab PT Goals PT Transfer Goal: Sit to Stand/Stand to Sit - Progress: Progressing toward goal PT Goal: Stand - Progress: Progressing toward goal PT Goal: Ambulate - Progress: Progressing toward goal  PT Treatment Precautions/Restrictions  Precautions Precautions: Fall Required Braces or Orthoses: Yes Knee Immobilizer: On when out of bed or walking Restrictions Weight Bearing Restrictions: Yes RLE Weight Bearing: Weight bearing as tolerated Mobility (including Balance) Bed Mobility Bed Mobility: No Transfers Transfers: Yes Sit to Stand: 3: Mod assist;From chair/3-in-1 Sit to Stand Details (indicate cue type and reason): A to initiate stand and to ensure balance. Cues for safe hand placement and RW safety.  Stand to Sit: 3: Mod assist;To chair/3-in-1 Stand to Sit Details: A to controll descent into chair as patient "falls" back into chair. Cues for safe hand placement and to slide out RLE. Ambulation/Gait Ambulation/Gait: Yes Ambulation/Gait Assistance: 4: Min assist Ambulation/Gait Assistance Details (indicate cue type and reason): A to ensure balance and safety with RW. Cues for gait sequence.  Ambulation Distance (Feet): 25 Feet Assistive device: Rolling walker Gait Pattern: Step-to pattern;Trunk flexed Gait velocity: Slower cadence secondary to pain Stairs: No Wheelchair Mobility Wheelchair Mobility: No    Exercise    End of Session PT - End of Session Equipment Utilized During Treatment: Gait belt (gait belt higher due to rib fx) Activity  Tolerance: Patient limited by fatigue;Patient limited by pain Patient left: in chair;with call bell in reach Nurse Communication: Mobility status for transfers General Behavior During Session: [facility] for tasks performed Cognition: [facility] for tasks performed  Fredrich Birks 08/21/2011, 11:34 AM 08/21/2011 Fredrich Birks PTA [phone] pager [phone] office

## 2011-08-22 LAB — CBC
MCH: 31.1 pg (ref 26.0–34.0)
MCV: 93.2 fL (ref 78.0–100.0)
Platelets: 192 10*3/uL (ref 150–400)
RDW: 15.3 % (ref 11.5–15.5)

## 2011-08-22 LAB — BASIC METABOLIC PANEL
Calcium: 8.6 mg/dL (ref 8.4–10.5)
Creatinine, Ser: 0.92 mg/dL (ref 0.50–1.35)
GFR calc Af Amer: >90 mL/min (ref >90)
GFR calc non Af Amer: 84 mL/min — ABNORMAL LOW (ref >90)

## 2011-08-22 LAB — APTT: aPTT: 32 seconds (ref 24–37)

## 2011-08-22 NOTE — Progress Notes (Signed)
     Subjective: Very tired with therapy, flatus, tol diet  Objective: Vital signs in last 24 hours: Temp:  [98.5 F (36.9 C)-99 F (37.2 C)] 98.6 F (37 C) (11/11 0612) Pulse Rate:  [85-98] 85  (11/11 0612) Resp:  [16-20] 20  (11/11 0612) BP: (109-117)/(56-63) 114/60 mmHg (11/11 0612) SpO2:  [93 %-96 %] 93 % (11/11 0612) Last BM Date: 08/20/11  Intake/Output from previous day: 11/10 0701 - 11/11 0700 In: 1140 [P.O.:1140] Out: 1050 [Urine:1050] Intake/Output this shift: Total I/O In: -  Out: 400 [Urine:400]  A&O lungs CTA CV reg ABD soft, ND, NT, active BS RLE knee immob, blister on calf  Lab Results: CBC   Basename 08/22/11 0841 08/20/11 0942  WBC 4.6 5.8  HGB 6.8* 7.5*  HCT 20.4* 22.2*  PLT 192 154   BMET  Basename 08/22/11 0841  NA 131*  K 3.8  CL 97  CO2 27  GLUCOSE 139*  BUN 16  CREATININE 0.92  CALCIUM 8.6   PT/INR  Basename 08/22/11 0841 08/20/11 0942  LABPROT 16.9* 19.6*  INR 1.35 1.63*   ABG No results found for this basename: PHART:2,PCO2:2,PO2:2,HCO3:2 in the last 72 hours  Studies/Results: No results found.  Anti-infectives: Anti-infectives    None      Assessment/Plan: Patient Active Problem List  Diagnoses  . HYPERLIPIDEMIA-MIXED  . TOBACCO ABUSE  . HYPERTENSION, BENIGN  . ATRIAL FIBRILLATION  . ABDOMINAL ANEURYSM WITHOUT MENTION OF RUPTURE  . Accident caused by unspecified machinery  . Multiple closed fractures of ribs of right side  . Crush injury elbow/forearm  . Injury of radial nerve at right upper arm level  . Tear of medial collateral ligament of knee  . Status post THR (total hip replacement)  . Warfarin anticoagulation   Ped V Tractor 1. Mult R rib FX - pulm toilet 2. RUE crush injury 3. Mult R rib fx- 4. R MCL injury- conservative Rx 5. FEN- ileus improving - reg diet 6. ABL anemia- Hb down TF one unit 7. VTE-SCD's as Hb down 8. DISP- Not a rehab candidate as lacks supports for DC to home.  Will  need temporary SNF   LOS: 6 days    Madia Carvell E 08/22/2011

## 2011-08-22 NOTE — Progress Notes (Signed)
Repeat cbc, inr, bmet sun Am  Mary Sella. Andrey Campanile, MD, FACS General, Bariatric, & Minimally Invasive Surgery [facility], [place]

## 2011-08-23 ENCOUNTER — Inpatient Hospital Stay
Admission: RE | Admit: 2011-08-23 | Discharge: 2011-09-01 | Disposition: A | Discharge status: Home / Self Care | Payer: Medicare Other | Source: Ambulatory Visit | Attending: Internal Medicine | Admitting: Internal Medicine

## 2011-08-23 DIAGNOSIS — S81801A Unspecified open wound, right lower leg, initial encounter: Secondary | ICD-10-CM | POA: Diagnosis present

## 2011-08-23 LAB — CBC
MCH: 29.8 pg (ref 26.0–34.0)
MCV: 90.9 fL (ref 78.0–100.0)
Platelets: 245 10*3/uL (ref 150–400)
RBC: 2.85 MIL/uL — ABNORMAL LOW (ref 4.22–5.81)
RDW: 16.7 % — ABNORMAL HIGH (ref 11.5–15.5)

## 2011-08-23 LAB — TYPE AND SCREEN

## 2011-08-23 MED ORDER — MAGNESIUM HYDROXIDE 400 MG/5ML PO SUSP: 30.0000 mL | Freq: Every day | ORAL | Status: AC

## 2011-08-23 MED ORDER — HYDROCORTISONE ACETATE 25 MG RE SUPP: 25.0000 mg | RECTAL | Status: AC

## 2011-08-23 MED ORDER — BACITRACIN ZINC 500 UNIT/GM EX OINT
TOPICAL_OINTMENT | CUTANEOUS | Status: DC | PRN
  Filled 2011-08-23: qty 15

## 2011-08-23 MED ORDER — OXYCODONE-ACETAMINOPHEN 5-325 MG PO TABS: 2.0000 | ORAL_TABLET | Freq: Every day | ORAL | Status: DC

## 2011-08-23 MED ORDER — DIPHENHYDRAMINE HCL 50 MG/ML IJ SOLN: 12.5000 mg | Freq: Four times a day (QID) | INTRAMUSCULAR | Status: DC | PRN

## 2011-08-23 MED ORDER — ONDANSETRON HCL 4 MG PO TABS: 4.0000 mg | ORAL_TABLET | Freq: Four times a day (QID) | ORAL | Status: AC | PRN

## 2011-08-23 MED ORDER — DSS 100 MG PO CAPS: 100.0000 mg | ORAL_CAPSULE | Freq: Two times a day (BID) | ORAL | Status: AC

## 2011-08-23 MED ORDER — CYCLOBENZAPRINE HCL 10 MG PO TABS: 10.0000 mg | ORAL_TABLET | Freq: Three times a day (TID) | ORAL | Status: AC | PRN

## 2011-08-23 MED ORDER — BACITRACIN ZINC 500 UNIT/GM EX OINT: TOPICAL_OINTMENT | CUTANEOUS | Status: AC | PRN

## 2011-08-23 MED ORDER — TRAMADOL HCL 50 MG PO TABS: 50.0000 mg | ORAL_TABLET | Freq: Four times a day (QID) | ORAL | Status: AC

## 2011-08-23 MED ORDER — OXYCODONE-ACETAMINOPHEN 5-325 MG PO TABS
2.0000 | ORAL_TABLET | ORAL | Status: DC | PRN
  Administered 2011-08-23: 2 via ORAL
  Filled 2011-08-23: qty 2

## 2011-08-23 MED ORDER — OXYCODONE-ACETAMINOPHEN 5-325 MG PO TABS: 2.0000 | ORAL_TABLET | ORAL | Status: AC | PRN

## 2011-08-23 MED ORDER — FLORA-Q PO CAPS: 1.0000 | ORAL_CAPSULE | Freq: Every day | ORAL | Status: DC

## 2011-08-23 MED ORDER — BACITRACIN ZINC 500 UNIT/GM EX OINT
TOPICAL_OINTMENT | Freq: Two times a day (BID) | CUTANEOUS | Status: DC
  Administered 2011-08-23: 12:00:00 via TOPICAL
  Filled 2011-08-23: qty 15

## 2011-08-23 MED ORDER — BACITRACIN ZINC 500 UNIT/GM EX OINT: TOPICAL_OINTMENT | Freq: Two times a day (BID) | CUTANEOUS | Status: AC

## 2011-08-23 MED ORDER — WARFARIN SODIUM 5 MG PO TABS: 2.5000 mg | ORAL_TABLET | Freq: Every day | ORAL | Status: DC

## 2011-08-23 MED ORDER — BISACODYL 10 MG RE SUPP: 10.0000 mg | Freq: Every day | RECTAL | Status: AC | PRN

## 2011-08-23 MED ORDER — OXYCODONE-ACETAMINOPHEN 5-325 MG PO TABS: 2.0000 | ORAL_TABLET | Freq: Every day | ORAL | Status: AC

## 2011-08-23 NOTE — Progress Notes (Signed)
Occupational Therapy Treatment Patient Details Name: Tyrone Mitchell MRN: [id number] DOB: 02/20/1941 Today's Date: 08/23/2011  OT Assessment/Plan OT Assessment/Plan Comments on Treatment Session: Educated patient re; healing process with aging, limiting time spent on exercises to 10 minutes and to allow 1-2 weeks before seeing results after pt indicated feeling the exercises were not helping.  (Added theraputty exercises to HEP to work on strength & [facility] Provided pt with HEP handouts to perform 3 x day.  OT Plan: Discharge plan remains appropriate;Frequency remains appropriate OT Frequency: Min 2X/week Follow Up Recommendations: Skilled nursing facility Equipment Recommended: Defer to next venue OT Goals Arm Goals Arm Goal: AROM - Progress: Progressing toward goal Arm Goal: Theraputty Exercises - Progress: Progressing toward goal  OT Treatment Precautions/Restrictions  Precautions Precautions: Fall Required Braces or Orthoses: Yes Knee Immobilizer: On when out of bed or walking Restrictions Weight Bearing Restrictions: Yes RLE Weight Bearing: Weight bearing as tolerated   ADL ADL Eating/Feeding: Not assessed Grooming: Not assessed Upper Body Bathing: Not assessed Lower Body Bathing: Not assessed Upper Body Dressing: Not assessed Lower Body Dressing: Not assessed Toilet Transfer: Not assessed Toileting - Clothing Manipulation: Not assessed Toileting - Hygiene: Not assessed Tub/Shower Transfer: Not assessed Mobility  Bed Mobility Bed Mobility: Yes (mod I scooting to HOB with HOB flat and using railings) Exercises Hand Exercises Digit Composite Flexion: 10 reps;Other (comment);AROM;Right;Seated;Strengthening (used tan theraputty for strengthening) Composite Extension: 10 reps;Other (comment);AROM;Right;Strengthening (rubber band used for strengthening) Digit Composite Abduction: 10 reps;Seated;Other (comment);AROM;Strengthening;Right Digit Composite Adduction: 10  reps;AROM;Strengthening;Right;Seated;Other (comment) (Tan theraputty used for strengthening) Digit Lifts: 10 reps;AROM;Right;Seated Opposition: 10 reps;AROM;Strengthening;Right;Other (comment);Seated (tan theraputty used for strengthening)  End of Session OT - End of Session Equipment Utilized During Treatment: Other (comment) Jodie Echevaria Theraputty) Activity Tolerance: Patient tolerated treatment well Patient left: in bed;with call bell in reach General Behavior During Session: [facility] for tasks performed Cognition: [facility] for tasks performed  Otis Peak OTS 08/23/2011, 10:40 AM  08/23/2011 Lucile Shutters   OTR/L Pager: [phone] Office: [phone] .

## 2011-08-23 NOTE — Discharge Summary (Signed)
This patient has been seen and I agree with the findings and treatment plan.  Roshonda Sperl O. Boby Eyer, III, MD, FACS [phone] (pager) [phone] (direct pager) Trauma Surgeon  

## 2011-08-23 NOTE — Progress Notes (Signed)
Physical Therapy Treatment Patient Details Name: AVANT PRINTY MRN: [id number] DOB: [birth year] Today's Date: 08/23/2011  PT Assessment/Plan  PT - Assessment/Plan Comments on Treatment Session: Pt progressing with ambulation today and was able to push himself further with all mobility.  PT Plan: Discharge plan remains appropriate PT Frequency: Min 6X/week Equipment Recommended: Defer to next venue PT Goals  Acute Rehab PT Goals PT Goal: Supine/Side to Sit - Progress: Met PT Transfer Goal: Sit to Stand/Stand to Sit - Progress: Met PT Goal: Ambulate - Progress: Progressing toward goal  PT Treatment Precautions/Restrictions  Precautions Precautions: Fall Required Braces or Orthoses: Yes Knee Immobilizer: On when out of bed or walking Restrictions Weight Bearing Restrictions: Yes RLE Weight Bearing: Weight bearing as tolerated Mobility (including Balance) Bed Mobility Bed Mobility: Yes (mod I scooting to HOB with HOB flat and using railings) Supine to Sit: 5: Supervision Supine to Sit Details (indicate cue type and reason): Cues for safe technique and hand placement. Cues for hip positioning for effiency Sitting - Scoot to Edge of Bed: 6: Modified independent (Device/Increase time) Sitting - Scoot to Edge of Bed Details (indicate cue type and reason): Increase time due to right upper thigh pain Transfers Transfers: Yes Sit to Stand: 4: Min assist Sit to Stand Details (indicate cue type and reason): A for balance and to initiate stand safely. Pt required two attempts to stand. Cues for safe hand placement with use of RW Stand to Sit: 4: Min assist Stand to Sit Details: Cues and A to slow descent into chair and to slide out RLE.  Ambulation/Gait Ambulation/Gait: Yes Ambulation/Gait Assistance: 4: Min assist Ambulation/Gait Assistance Details (indicate cue type and reason): A for balance and use of RW. Cues for gait sequence.  Ambulation Distance (Feet): 60 Feet Assistive  device: Rolling walker Gait Pattern: Step-to pattern;Decreased step length - right;Decreased step length - left;Trunk flexed;Antalgic Gait velocity: decreased Stairs: No Wheelchair Mobility Wheelchair Mobility: No    Exercise  Hand Exercises Digit Composite Flexion: 10 reps;Other (comment);AROM;Right;Seated;Strengthening (used tan theraputty for strengthening) Composite Extension: 10 reps;Other (comment);AROM;Right;Strengthening (rubber band used for strengthening) Digit Composite Abduction: 10 reps;Seated;Other (comment);AROM;Strengthening;Right Digit Composite Adduction: 10 reps;AROM;Strengthening;Right;Seated;Other (comment) (Tan theraputty used for strengthening) Digit Lifts: 10 reps;AROM;Right;Seated Opposition: 10 reps;AROM;Strengthening;Right;Other (comment);Seated (tan theraputty used for strengthening) End of Session PT - End of Session Equipment Utilized During Treatment: Gait belt Activity Tolerance: Patient limited by pain Patient left: in chair;with call bell in reach Nurse Communication: Mobility status for transfers General Behavior During Session: [facility] for tasks performed Cognition: [facility] for tasks performed  Chianne Byrns, Adline Potter 08/23/2011, 11:58 AM

## 2011-08-23 NOTE — Discharge Summary (Signed)
Physician Discharge Summary  Patient ID: Tyrone Mitchell MRN: [id number] DOB/AGE: [birth year] 70 y.o.  Admit date: 08/16/2011 Discharge date: 08/23/2011  Discharge Diagnoses Patient Active Problem List  Diagnoses Date Noted  . Wound of right leg 08/23/2011  . Status post THR (total hip replacement) 08/18/2011  . Warfarin anticoagulation 08/18/2011  . Accident caused by unspecified machinery 08/16/2011  . Multiple closed fractures of ribs of right side 08/16/2011  . Crush injury elbow/forearm 08/16/2011  . 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 Tear of medial collateral ligament of knee 08/16/2011  . ABDOMINAL ANEURYSM WITHOUT MENTION OF RUPTURE 04/03/2009  . HYPERLIPIDEMIA-MIXED 12/18/2008  . TOBACCO ABUSE 12/18/2008  . HYPERTENSION, BENIGN 12/18/2008  . ATRIAL FIBRILLATION 12/18/2008    Consultants [name] for orthopedic surgery  Procedures None  HPI: Patient was trying to get onto tractor when he slipped and accidentally put the tractor in gear. The large rear tire caught him and knocked him down and rolled over his right chest and right arm. No LOC. Heard his ribs break. Arm was trapped under tractor tire for about 15 minutes. Complains of right chest, right arm, and right leg pain. Workup showed multiple right-sided rib fractures without significant hemothorax or pneumothorax. Orthopedic workup did not show any fractures, but because of the crush injury to the right upper extremity, [name] from orthopedic surgery was consulted. he diagnosed the patient with a right radial nerve injury and a likely left medial collateral ligament injury. He was admitted for pain control, pulmonary toilet, and mobilization.   Hospital Course:  The patient did well from a respiratory standpoint in the hospital. He worked with his incentive spirometer and did not get into trouble from an oxygenation standpoint. He had acute blood loss anemia that kept drifting downward  and required a small amount of packed red blood cell transfusion. He was mobilized with physical and occupational therapy ended about as well as expected. However, he lives alone and has no one to help him and was not quite at the level to be able to return safely to live alone and so skilled nursing facility was sought and found for the patient. He did have a small abrasion on the lateral right shin that ulcerated during his hospital stay, likely secondary to his right lower extremity edema and the fact that it was covered with a knee immobilizer. This was being treated locally at the time of discharge. At this point we are able to transfer him to skilled nursing facility in good condition.    Current Discharge Medication List    START taking these medications   Details  !! bacitracin ointment Apply topically 2 (two) times daily. Qty: 120 g    !! bacitracin ointment Apply topically as needed (to keep moist). Qty: 120 g    bisacodyl (DULCOLAX) 10 MG suppository Place 1 suppository (10 mg total) rectally daily as needed for constipation. Qty: 12 suppository    cyclobenzaprine (FLEXERIL) 10 MG tablet Take 1 tablet (10 mg total) by mouth 3 (three) times daily as needed for muscle spasms. Qty: 30 tablet    diphenhydrAMINE (BENADRYL) 50 MG/ML injection Inject 0.25 mLs (12.5 mg total) into the vein every 6 (six) hours as needed for itching.    docusate sodium 100 MG CAPS Take 100 mg by mouth 2 (two) times daily. Qty: 10 capsule    Flora-Q (FLORA-Q) CAPS Take 1 capsule by mouth daily.    hydrocortisone (ANUSOL-HC) 25 MG suppository  Place 1 suppository (25 mg total) rectally 1 day or 1 dose. Qty: 12 suppository    magnesium hydroxide (MILK OF MAGNESIA) 400 MG/5ML suspension Take 30 mLs by mouth daily. Qty: 360 mL    ondansetron (ZOFRAN) 4 MG tablet Take 1 tablet (4 mg total) by mouth every 6 (six) hours as needed for nausea. Qty: 20 tablet    !! oxyCODONE-acetaminophen (PERCOCET) 5-325 MG  per tablet Take 2 tablets by mouth every 4 (four) hours as needed (pain). Qty: 36 tablet, Refills: 0    !! oxyCODONE-acetaminophen (PERCOCET) 5-325 MG per tablet Take 2 tablets by mouth daily at 3am Qty: 30 tablet    traMADol (ULTRAM) 50 MG tablet Take 1 tablet (50 mg total) by mouth every 6 (six) hours. Maximum dose= 8 tablets per day Qty: 30 tablet     !! - Potential duplicate medications found. Please discuss with provider.    CONTINUE these medications which have CHANGED   Details  warfarin (COUMADIN) 5 MG tablet Take 0.5-1 tablets (2.5-5 mg total) by mouth daily. Take 1/2 a tablet on thursdays and 1 tablet the rest of the week.      CONTINUE these medications which have NOT CHANGED   Details  aspirin 81 MG tablet Take 81 mg by mouth daily.      flecainide (TAMBOCOR) 100 MG tablet Take 1 tablet (100 mg total) by mouth 2 (two) times daily. Qty: 60 tablet, Refills: 5    niacin 500 MG tablet Take 500 mg by mouth daily with breakfast.      omeprazole (PRILOSEC) 20 MG capsule Take 20 mg by mouth daily.           simvastatin (ZOCOR) 20 MG tablet Take 20 mg by mouth at bedtime.       !! - Potential duplicate medications found. Please discuss with provider.       Follow-up Information    Make an appointment with YATES,MARK C.   Contact information:   [facility] [address] [place] [address] [phone]       Call [facility]. (As needed)    Contact information:   [address] [place] [address] [phone]         Signed: [name], PA-C Pager: [phone] General Trauma PA Pager: [phone]  08/23/2011, 2:19 PM

## 2011-08-23 NOTE — Progress Notes (Signed)
CSW facilitated patient discharge including contacting patient facility and arranging ambulance transport to [facility].  Patient stated he will contact family/friends to notify about dc plans.  No further SW needs at this time.  [address] [place], [place] [phone]

## 2011-08-23 NOTE — Progress Notes (Signed)
LOS: 7 days   Subjective: Pt in significant discomfort this am. Says it happens every morning as pain meds wear off overnight.  Objective: Vital signs in last 24 hours: Temp:  [97.9 F (36.6 C)-98.9 F (37.2 C)] 98.7 F (37.1 C) (11/12 0545) Pulse Rate:  [78-90] 79  (11/12 0545) Resp:  [18] 18  (11/12 0545) BP: (100-118)/(53-70) 113/65 mmHg (11/12 0545) SpO2:  [92 %-96 %] 92 % (11/12 0545) Last BM Date: 08/20/11  Lab Results:  CBC  Basename 08/22/11 0841 08/20/11 0942  WBC 4.6 5.8  HGB 6.8* 7.5*  HCT 20.4* 22.2*  PLT 192 154   BMET  Basename 08/22/11 0841  NA 131*  K 3.8  CL 97  CO2 27  GLUCOSE 139*  BUN 16  CREATININE 0.92  CALCIUM 8.6    General appearance: alert and mild distress Resp: clear to auscultation bilaterally Cardio: regular rate and rhythm GI: normal findings: bowel sounds normal and soft, non-tender Extremities: edema RLE and 2cm ulceration lateral lower leg. No surrounding/streaking erythema.  Assessment/Plan: Blunt trauma Multiple right rib fxs RUE crush injury w/radial nerve injury Right MCL injury RLE contusion/abrasion -- Wound looks ok. Edema of leg is not helping it. Continue local care. ABL anemia -- Check CBC this am after transfusion yesterday. FEN -- Will schedule a dose of Percocet overnight. VTE -- SCD's Dispo -- Ok for d/c to SNF when bed available.   [name], PA-C Pager: [phone] General Trauma PA Pager: [phone]   08/23/2011      This patient has been seen and I agree with the findings and treatment plan.  Marta Lamas. Gae Bon, MD, FACS [phone] (pager) [phone] (direct pager) Trauma Surgeon

## 2011-09-30 ENCOUNTER — Encounter (HOSPITAL_BASED_OUTPATIENT_CLINIC_OR_DEPARTMENT_OTHER): Payer: Medicare Other | Attending: Internal Medicine

## 2011-09-30 DIAGNOSIS — I4891 Unspecified atrial fibrillation: Secondary | ICD-10-CM | POA: Insufficient documentation

## 2011-09-30 DIAGNOSIS — S81009A Unspecified open wound, unspecified knee, initial encounter: Secondary | ICD-10-CM | POA: Insufficient documentation

## 2011-09-30 DIAGNOSIS — Z79899 Other long term (current) drug therapy: Secondary | ICD-10-CM | POA: Insufficient documentation

## 2011-09-30 DIAGNOSIS — E785 Hyperlipidemia, unspecified: Secondary | ICD-10-CM | POA: Insufficient documentation

## 2011-09-30 DIAGNOSIS — I10 Essential (primary) hypertension: Secondary | ICD-10-CM | POA: Insufficient documentation

## 2011-09-30 DIAGNOSIS — F17200 Nicotine dependence, unspecified, uncomplicated: Secondary | ICD-10-CM | POA: Insufficient documentation

## 2011-09-30 DIAGNOSIS — Z96649 Presence of unspecified artificial hip joint: Secondary | ICD-10-CM | POA: Insufficient documentation

## 2011-09-30 DIAGNOSIS — W309XXA Contact with unspecified agricultural machinery, initial encounter: Secondary | ICD-10-CM | POA: Insufficient documentation

## 2011-09-30 DIAGNOSIS — S81809A Unspecified open wound, unspecified lower leg, initial encounter: Secondary | ICD-10-CM | POA: Insufficient documentation

## 2011-09-30 NOTE — Progress Notes (Signed)
[facility]  NAME:  POWELL, HALBERT NO.:  [id number]  MEDICAL RECORD NO.:  [id number]      DATE OF BIRTH:  [birth year]  PHYSICIAN:  Maxwell Caul, M.D.      VISIT DATE:                                  OFFICE VISIT   HISTORY:  Mr. Susman is a gentleman who I actually know from a stay at [facility] in November.  At that point, he had been admitted to [facility] from August 16, 2011,  through August 23, 2011, during which he slipped while getting out of a tractor and accidentally put the tractor in gear.  He had trauma to his right arm, his right upper chest with multiple right rib fractures, a tear of the right medial collateral ligament.  He was only in the facility for a week.  I believe he had a right leg brace on.  My notes describe a laceration on his right lower leg, but I have no significant memory of this.  In any case, the patient states that this scabbed over, however, he had a home health and apparently this is opened up into a fairly substantial wound.  He is recently been put on doxycycline by his primary care physician, although I do not have any of these culture results.  PAST MEDICAL HISTORY: 1. Atrial fibrillation, on Coumadin.  Right total hip replacement 2-3     months ago. 2. Multiple trauma in early November as described above. 3. Tobacco abuse. 4. Hypertension. 5. Hyperlipidemia.  MEDICATIONS:  Medication list is reviewed. 1. Doxycycline 100 twice a day. 2. Prilosec 20 mg twice a day. 3. Flecainide. 4. Oxycodone. 5. Currently, I do not see Coumadin on the list.  ALLERGIES:  Noted to PENICILLIN.  PHYSICAL EXAMINATION:  VITAL SIGNS:  Temperature is 98.1, pulse 76, respirations 18, blood pressure 117/93.  On his right lateral leg, there is a fairly substantial wound measuring 1.3 x 0.7 x 0.8 however there is a slightly over a cm of undermining from 1-7 o'clock.  Although, there was  some concern expressed apparently to the patient by his home health nurse that there was necrotic tissue here.  I did not see any of this. I did use a curette to debride the base of the wound even under the undermining, but really nothing looked purulent.  I did culture the base of the wound, however, I did not alter his current antibiotic which is doxycycline.  IMPRESSION:  Traumatic wound, right lower leg.  This is obviously deteriorated from the last time I saw this.  The wound was cultured.  He is already on doxycycline, I did not change this.  The wound was packed with Aquacel Ag, and we put some foam on top of this and then a Kerlix Coban wrap.  We took the wrap to the level of his right knee brace.  The patient has expressed concern that he cannot get his medial collateral ligament repaired on the right knee until the wound is healed.  We have called in orders to [facility] to change this dressing 2 times per week and we will see him again in 1 week's time.          ______________________________ Venetia Night.  Leanord Hawking, M.D.     MGR/MEDQ  D:  09/30/2011  T:  09/30/2011  Job:  [id number]

## 2011-10-14 ENCOUNTER — Encounter (HOSPITAL_BASED_OUTPATIENT_CLINIC_OR_DEPARTMENT_OTHER): Payer: Medicare Other | Attending: Internal Medicine

## 2011-10-14 DIAGNOSIS — S91009A Unspecified open wound, unspecified ankle, initial encounter: Secondary | ICD-10-CM | POA: Insufficient documentation

## 2011-10-14 DIAGNOSIS — Z79899 Other long term (current) drug therapy: Secondary | ICD-10-CM | POA: Insufficient documentation

## 2011-10-14 DIAGNOSIS — X58XXXA Exposure to other specified factors, initial encounter: Secondary | ICD-10-CM | POA: Insufficient documentation

## 2011-10-14 DIAGNOSIS — S81009A Unspecified open wound, unspecified knee, initial encounter: Secondary | ICD-10-CM | POA: Insufficient documentation

## 2011-10-14 DIAGNOSIS — C61 Malignant neoplasm of prostate: Secondary | ICD-10-CM | POA: Insufficient documentation

## 2011-10-22 ENCOUNTER — Encounter: Payer: Self-pay | Admitting: Physician Assistant

## 2011-10-27 ENCOUNTER — Encounter: Payer: Self-pay | Admitting: Physician Assistant

## 2011-10-27 ENCOUNTER — Ambulatory Visit (INDEPENDENT_AMBULATORY_CARE_PROVIDER_SITE_OTHER): Payer: Medicare Other | Admitting: Physician Assistant

## 2011-10-27 VITALS — BP 136/72 | Resp 18 | Ht 74.0 in | Wt 211.0 lb

## 2011-10-27 DIAGNOSIS — I714 Abdominal aortic aneurysm, without rupture, unspecified: Secondary | ICD-10-CM | POA: Insufficient documentation

## 2011-10-27 DIAGNOSIS — I4891 Unspecified atrial fibrillation: Secondary | ICD-10-CM

## 2011-10-27 DIAGNOSIS — S81801A Unspecified open wound, right lower leg, initial encounter: Secondary | ICD-10-CM

## 2011-10-27 DIAGNOSIS — R002 Palpitations: Secondary | ICD-10-CM

## 2011-10-27 DIAGNOSIS — R0602 Shortness of breath: Secondary | ICD-10-CM | POA: Insufficient documentation

## 2011-10-27 LAB — BASIC METABOLIC PANEL
CO2: 26 mEq/L (ref 19–32)
Calcium: 9.2 mg/dL (ref 8.4–10.5)
Creatinine, Ser: 1.2 mg/dL (ref 0.4–1.5)

## 2011-10-27 NOTE — Assessment & Plan Note (Signed)
He is due for a followup ultrasound.  This will be arranged.

## 2011-10-27 NOTE — Assessment & Plan Note (Signed)
Check an event monitor and echocardiogram as noted.  He remains on Coumadin which is managed by his PCP.  Continue current dose of flecainide and the new medication which was given to him by his PCP.

## 2011-10-27 NOTE — Patient Instructions (Signed)
Your physician recommends that you schedule a follow-up appointment in: 6-8 weeks with Dr Ladona Ridgel Your physician recommends that you return for lab work in: today (BMP, BNP, TSH)  Your physician has requested that you have an echocardiogram. Echocardiography is a painless test that uses sound waves to create images of your heart. It provides your doctor with information about the size and shape of your heart and how well your heart's chambers and valves are working. This procedure takes approximately one hour. There are no restrictions for this procedure.  Your physician has requested that you have an abdominal aorta duplex. During this test, an ultrasound is used to evaluate the aorta. Allow 30 minutes for this exam. Do not eat after midnight the day before and avoid carbonated beverages  Your physician has recommended that you wear an event monitor. Event monitors are medical devices that record the heart's electrical activity. Doctors most often [place] these monitors to diagnose arrhythmias. Arrhythmias are problems with the speed or rhythm of the heartbeat. The monitor is a small, portable device. You can wear one while you do your normal daily activities. This is usually used to diagnose what is causing palpitations/syncope (passing out).

## 2011-10-27 NOTE — Progress Notes (Signed)
[address] [place], [place]  [address] Phone: [phone] Fax:  [phone]  Date:  10/27/2011   Name:  Tyrone Mitchell       DOB:  June 21, 1941 MRN:  [id number]  PCP:  Dr. Lysbeth Galas Primary Cardiologist/Electrophysiologist:  Dr. [name]    History of Present Illness: Tyrone Mitchell is a 71 y.o. male who returns for follow up.  He has a history of recurrent atrial fibrillation.  For the most part, he has maintained NSR on flecainide.  He is on chronic Coumadin therapy which is followed by his PCP.  He has a history of diastolic heart failure in the setting of atrial fibrillation with RVR in 2009.  Echocardiogram 5/09: EF 55%, mild-moderate MR, moderate LAE.  Stress nuclear 5/09: No scar or ischemia.  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  He was last seen by Dr. Ladona Ridgel one year ago.  Unfortunately he was hospitalized 11/5-11/12 with a tractor accident.  He was caught under the tire of a tractor for some time and broke several ribs and had a significant right leg injury.  He spent some time in the skilled nursing facility afterward.    Over the last couple of weeks, he has noted high heart rates on his blood pressure machine.  He goes to the wound Center for a wound on his right leg.  They also noted a high heart rate.  When he has this, he notes dyspnea with exertion and fatigue.  Otherwise, he denies chest pain, shortness of breath, syncope, near syncope, orthopnea, PND or significant pedal edema.  He denies any palpitations.  He was concerned he was having paroxysms of atrial fibrillation again.  He apparently saw the PA at his PCPs office.  Apparently they spoke to someone here and he was placed on another medication.  It sounds as though he is describing metoprolol.  He later discovered that he was missing some of his flecainide and taking it once daily for several days.  He is now on the  new medication and is back on flecainide twice a day.  Since that time he has had one other episode.  No ECG has been done until today.    Past Medical History  Diagnosis Date  . Atrial fibrillation   . GERD (gastroesophageal reflux disease)   . Hyperlipidemia   . Anemia     Current Outpatient Prescriptions  Medication Sig Dispense Refill  . aspirin 81 MG tablet Take 81 mg by mouth daily.        . diphenhydrAMINE (BENADRYL) 50 MG/ML injection Inject 0.25 mLs (12.5 mg total) into the vein every 6 (six) hours as needed for itching.      . flecainide (TAMBOCOR) 100 MG tablet Take 1 tablet (100 mg total) by mouth 2 (two) times daily.  60 tablet  5  . Flora-Q (FLORA-Q) CAPS Take 1 capsule by mouth daily.      . niacin 500 MG tablet Take 500 mg by mouth daily with breakfast.        . omeprazole (PRILOSEC) 20 MG capsule Take 20 mg by mouth daily.        Marland Kitchen oxyCODONE-acetaminophen (PERCOCET) 5-325 MG per tablet Take 1 tablet by mouth every 4 (four) hours as needed.        . simvastatin (ZOCOR) 20 MG tablet Take 20 mg by mouth at bedtime.        Marland Kitchen  warfarin (COUMADIN) 5 MG tablet Take 0.5-1 tablets (2.5-5 mg total) by mouth daily. Take 1/2 a tablet on thursdays and 1 tablet the rest of the week.        Allergies: Allergies  Allergen Reactions  . Penicillins     History  Substance Use Topics  . Smoking status: Former Smoker    Types: Cigars    Quit date: 08/27/2011  . Smokeless tobacco: Not on file  . Alcohol Use: Not on file     ROS:  Please see the history of present illness.   All other systems reviewed and negative.   PHYSICAL EXAM: VS:  BP 136/72  Resp 18  Ht 6\' 2"  (1.88 m)  Wt 211 lb (95.709 kg)  BMI 27.09 kg/m2 Well nourished, well developed, in no acute distress HEENT: normal Neck: no JVD Endocrine: no thyromegaly Cardiac:  normal S1, S2; RRR; no murmur Lungs:  clear to auscultation bilaterally, no wheezing, rhonchi or rales Abd: soft, nontender, no hepatomegaly Ext:  trace to 1+ ankle edema on the right MSK: right knee brace noted Skin: warm and dry Neuro:  CNs 2-12 intact, no focal abnormalities noted  EKG:   Sinus rhythm, heart rate 64, normal axis, nonspecific ST-T wave changes  ASSESSMENT AND PLAN:

## 2011-10-27 NOTE — Assessment & Plan Note (Signed)
He is recovering.  There is no mention of him having atrial fibrillation in the hospital when he was admitted for his injury.

## 2011-10-27 NOTE — Assessment & Plan Note (Signed)
I suspect he was having paroxysms of atrial fibrillation.  This was likely exacerbated by him missing several of his flecainide doses.  He is currently in sinus rhythm.  I will have him undergo an event monitor to see if he is having significant amounts of atrial fibrillation.  Also, obtain an echocardiogram given his palpitations and shortness of breath.  Check a basic metabolic panel, CBC and TSH.  Followup with Dr. Ladona Ridgel in 6-8 weeks.

## 2011-10-28 ENCOUNTER — Encounter (HOSPITAL_BASED_OUTPATIENT_CLINIC_OR_DEPARTMENT_OTHER): Payer: Medicare Other

## 2011-10-28 LAB — BRAIN NATRIURETIC PEPTIDE: Pro B Natriuretic peptide (BNP): 48 pg/mL (ref 0.0–100.0)

## 2011-10-29 NOTE — Progress Notes (Signed)
Addended by: Judithe Modest D on: 10/29/2011 02:41 PM   Modules accepted: Orders

## 2011-10-30 ENCOUNTER — Other Ambulatory Visit: Payer: Self-pay | Admitting: Internal Medicine

## 2011-11-01 ENCOUNTER — Other Ambulatory Visit: Payer: Self-pay

## 2011-11-01 MED ORDER — FLECAINIDE ACETATE 100 MG PO TABS: 100.0000 mg | ORAL_TABLET | Freq: Two times a day (BID) | ORAL | Status: DC

## 2011-11-02 ENCOUNTER — Encounter (INDEPENDENT_AMBULATORY_CARE_PROVIDER_SITE_OTHER): Payer: Medicare Other

## 2011-11-02 ENCOUNTER — Ambulatory Visit (HOSPITAL_COMMUNITY): Payer: Medicare Other | Attending: Cardiology

## 2011-11-02 DIAGNOSIS — I714 Abdominal aortic aneurysm, without rupture, unspecified: Secondary | ICD-10-CM | POA: Insufficient documentation

## 2011-11-02 DIAGNOSIS — I4891 Unspecified atrial fibrillation: Secondary | ICD-10-CM | POA: Insufficient documentation

## 2011-11-02 DIAGNOSIS — R0609 Other forms of dyspnea: Secondary | ICD-10-CM | POA: Insufficient documentation

## 2011-11-02 DIAGNOSIS — R002 Palpitations: Secondary | ICD-10-CM | POA: Insufficient documentation

## 2011-11-02 DIAGNOSIS — R5383 Other fatigue: Secondary | ICD-10-CM | POA: Insufficient documentation

## 2011-11-02 DIAGNOSIS — R0602 Shortness of breath: Secondary | ICD-10-CM

## 2011-11-02 DIAGNOSIS — I509 Heart failure, unspecified: Secondary | ICD-10-CM | POA: Insufficient documentation

## 2011-11-02 DIAGNOSIS — E785 Hyperlipidemia, unspecified: Secondary | ICD-10-CM | POA: Insufficient documentation

## 2011-11-02 DIAGNOSIS — R0989 Other specified symptoms and signs involving the circulatory and respiratory systems: Secondary | ICD-10-CM | POA: Insufficient documentation

## 2011-11-02 DIAGNOSIS — R5381 Other malaise: Secondary | ICD-10-CM | POA: Insufficient documentation

## 2011-11-10 ENCOUNTER — Other Ambulatory Visit: Payer: Self-pay | Admitting: Cardiology

## 2011-11-10 DIAGNOSIS — I714 Abdominal aortic aneurysm, without rupture: Secondary | ICD-10-CM

## 2011-11-11 ENCOUNTER — Encounter (INDEPENDENT_AMBULATORY_CARE_PROVIDER_SITE_OTHER): Payer: Medicare Other | Admitting: Cardiology

## 2011-11-11 DIAGNOSIS — I700 Atherosclerosis of aorta: Secondary | ICD-10-CM

## 2011-11-11 DIAGNOSIS — I714 Abdominal aortic aneurysm, without rupture: Secondary | ICD-10-CM

## 2011-11-18 ENCOUNTER — Encounter (HOSPITAL_BASED_OUTPATIENT_CLINIC_OR_DEPARTMENT_OTHER): Payer: Medicare Other

## 2011-11-18 ENCOUNTER — Encounter (HOSPITAL_BASED_OUTPATIENT_CLINIC_OR_DEPARTMENT_OTHER): Payer: Medicare Other | Attending: Internal Medicine

## 2011-11-18 DIAGNOSIS — S81809A Unspecified open wound, unspecified lower leg, initial encounter: Secondary | ICD-10-CM | POA: Insufficient documentation

## 2011-11-18 DIAGNOSIS — W309XXA Contact with unspecified agricultural machinery, initial encounter: Secondary | ICD-10-CM | POA: Insufficient documentation

## 2011-11-18 DIAGNOSIS — S81009A Unspecified open wound, unspecified knee, initial encounter: Secondary | ICD-10-CM | POA: Insufficient documentation

## 2011-11-19 ENCOUNTER — Encounter (HOSPITAL_BASED_OUTPATIENT_CLINIC_OR_DEPARTMENT_OTHER): Payer: Medicare Other

## 2011-12-15 ENCOUNTER — Ambulatory Visit: Payer: Medicare Other | Admitting: Internal Medicine

## 2011-12-16 ENCOUNTER — Encounter (HOSPITAL_BASED_OUTPATIENT_CLINIC_OR_DEPARTMENT_OTHER): Payer: Medicare Other | Attending: Internal Medicine

## 2011-12-16 DIAGNOSIS — W309XXA Contact with unspecified agricultural machinery, initial encounter: Secondary | ICD-10-CM | POA: Insufficient documentation

## 2011-12-16 DIAGNOSIS — S81009A Unspecified open wound, unspecified knee, initial encounter: Secondary | ICD-10-CM | POA: Insufficient documentation

## 2011-12-16 DIAGNOSIS — S91009A Unspecified open wound, unspecified ankle, initial encounter: Secondary | ICD-10-CM | POA: Insufficient documentation

## 2012-01-25 ENCOUNTER — Ambulatory Visit (INDEPENDENT_AMBULATORY_CARE_PROVIDER_SITE_OTHER): Payer: Medicare Other | Admitting: Internal Medicine

## 2012-01-25 ENCOUNTER — Encounter: Payer: Self-pay | Admitting: Internal Medicine

## 2012-01-25 VITALS — BP 138/72 | HR 62 | Ht 74.0 in | Wt 221.1 lb

## 2012-01-25 DIAGNOSIS — I714 Abdominal aortic aneurysm, without rupture, unspecified: Secondary | ICD-10-CM

## 2012-01-25 DIAGNOSIS — R002 Palpitations: Secondary | ICD-10-CM

## 2012-01-25 DIAGNOSIS — I4891 Unspecified atrial fibrillation: Secondary | ICD-10-CM

## 2012-01-25 NOTE — Assessment & Plan Note (Signed)
His symptoms have resolved. He will continue his current dose of flecainide.
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
## 2012-01-25 NOTE — Patient Instructions (Signed)
Your physician recommends that you continue on your current medications as directed. Please refer to the Current Medication list given to you today.  Your physician wants you to follow-up in: 12 months You will receive a reminder letter in the mail two months in advance. If you don't receive a letter, please call our office to schedule the follow-up appointment.  

## 2012-01-25 NOTE — Assessment & Plan Note (Signed)
He denies recurrent palpitations. He appears to be maintaining sinus rhythm. He will continue his current dose of flecainide 100 mg twice daily.

## 2012-01-25 NOTE — Assessment & Plan Note (Signed)
His dimensions are minimally increased. He will require repeat ultrasound in the next year or 2.

## 2012-04-26 ENCOUNTER — Other Ambulatory Visit: Payer: Self-pay | Admitting: Internal Medicine

## 2012-04-26 NOTE — Telephone Encounter (Signed)
Refilled tambocor

## 2012-08-22 ENCOUNTER — Telehealth: Payer: Self-pay | Admitting: Internal Medicine

## 2012-08-22 NOTE — Telephone Encounter (Signed)
lmom for for the patient asking him to have his surgeons office fax [place] a clearance letter

## 2012-08-22 NOTE — Telephone Encounter (Signed)
New Problem:    Patient called in because he is scheduled to have hip replacement surgery on 09/13/12 and wanted to know if he needed to be seen again in order to be surgically cleared.  Please call back and feel free to leave a message.

## 2012-08-25 ENCOUNTER — Other Ambulatory Visit (HOSPITAL_COMMUNITY): Payer: Self-pay | Admitting: Orthopedic Surgery

## 2012-08-25 DIAGNOSIS — M25551 Pain in right hip: Secondary | ICD-10-CM

## 2012-08-30 ENCOUNTER — Encounter (HOSPITAL_COMMUNITY): Payer: Self-pay | Admitting: Pharmacy Technician

## 2012-08-31 ENCOUNTER — Other Ambulatory Visit (HOSPITAL_COMMUNITY): Payer: Medicare Other

## 2012-08-31 ENCOUNTER — Encounter (HOSPITAL_COMMUNITY): Payer: Medicare Other

## 2012-09-01 ENCOUNTER — Encounter (HOSPITAL_COMMUNITY): Payer: Medicare Other

## 2012-09-01 ENCOUNTER — Ambulatory Visit (HOSPITAL_COMMUNITY): Payer: Medicare Other

## 2012-09-04 ENCOUNTER — Encounter (HOSPITAL_COMMUNITY)
Admission: RE | Admit: 2012-09-04 | Discharge: 2012-09-04 | Disposition: A | Discharge status: Home / Self Care | Payer: Medicare Other | Source: Ambulatory Visit | Attending: Orthopedic Surgery | Admitting: Orthopedic Surgery

## 2012-09-04 ENCOUNTER — Encounter (HOSPITAL_COMMUNITY): Payer: Self-pay

## 2012-09-04 DIAGNOSIS — M25551 Pain in right hip: Secondary | ICD-10-CM

## 2012-09-04 DIAGNOSIS — M25559 Pain in unspecified hip: Secondary | ICD-10-CM | POA: Insufficient documentation

## 2012-09-04 HISTORY — DX: Malignant (primary) neoplasm, unspecified: C80.1

## 2012-09-04 MED ORDER — TECHNETIUM TC 99M MEDRONATE IV KIT
20.0000 | PACK | Freq: Once | INTRAVENOUS | Status: AC | PRN
  Administered 2012-09-04: 19 via INTRAVENOUS

## 2012-09-06 ENCOUNTER — Encounter (HOSPITAL_COMMUNITY): Payer: Self-pay

## 2012-09-06 ENCOUNTER — Encounter (HOSPITAL_COMMUNITY)
Admission: RE | Admit: 2012-09-06 | Discharge: 2012-09-06 | Disposition: A | Discharge status: Home / Self Care | Payer: Medicare Other | Source: Ambulatory Visit | Attending: Orthopedic Surgery | Admitting: Orthopedic Surgery

## 2012-09-06 ENCOUNTER — Ambulatory Visit (HOSPITAL_COMMUNITY)
Admission: RE | Admit: 2012-09-06 | Discharge: 2012-09-06 | Disposition: A | Discharge status: Home / Self Care | Payer: Medicare Other | Source: Ambulatory Visit | Attending: Orthopedic Surgery | Admitting: Orthopedic Surgery

## 2012-09-06 DIAGNOSIS — Z01811 Encounter for preprocedural respiratory examination: Secondary | ICD-10-CM | POA: Insufficient documentation

## 2012-09-06 LAB — URINALYSIS, ROUTINE W REFLEX MICROSCOPIC
Bilirubin Urine: NEGATIVE
Glucose, UA: NEGATIVE mg/dL
Ketones, ur: NEGATIVE mg/dL
Leukocytes, UA: NEGATIVE
pH: 5.5 (ref 5.0–8.0)

## 2012-09-06 LAB — BASIC METABOLIC PANEL
BUN: 18 mg/dL (ref 6–23)
CO2: 26 mEq/L (ref 19–32)
Calcium: 9.6 mg/dL (ref 8.4–10.5)
Chloride: 101 mEq/L (ref 96–112)
Creatinine, Ser: 1.16 mg/dL (ref 0.50–1.35)

## 2012-09-06 LAB — CBC
HCT: 39.5 % (ref 39.0–52.0)
MCH: 31.5 pg (ref 26.0–34.0)
MCHC: 33.9 g/dL (ref 30.0–36.0)
MCV: 92.9 fL (ref 78.0–100.0)
Platelets: 199 10*3/uL (ref 150–400)
RDW: 13.7 % (ref 11.5–15.5)

## 2012-09-06 NOTE — Patient Instructions (Addendum)
20      Your procedure is scheduled on:  Tuesday 09/12/2012 at 335 pm  Report to [facility] at 100 PM.  Call this number if you have problems the morning of surgery: [phone]   Remember:   Do not eat food after midnight! MAY HAVE CLEAR LIQUIDS FROM MIDNIGHT UP UNTIL 0935 AM THEN NOTHING UNTIL AFTER SURGERY!  Take these medicines the morning of surgery with A SIP OF WATER: Flecainide, Prilosec   Do not bring valuables to the hospital.  .  Leave suitcase in the car. After surgery it may be brought to your room.  For patients admitted to the hospital, checkout time is 11:00 AM the day of              Discharge.    Special Instructions: See [facility] Preparing  For Surgery Instruction Sheet. Do not wear jewelry, lotions powders, perfumes. Women do not shave legs or underarms for 12 hours before showers. Contacts, partial plates, or dentures may not be worn into surgery.                          Patients discharged the day of surgery will not be allowed to drive home. If going home the same day of surgery, must have someone stay with you first 24 hrs.at home and arrange for someone to drive you home from the              Hospital. YOUR DRIVER IS: son-Jeff   Please read over the following fact sheets that you were given: MRSA INFORMATION,INCENTIVE SPIROMETRY SHEET, SLEEP APNEA SHEET, BLOOD TRANSFUSION SHEET                            Telford Nab.Traeger Sultana,RN,BSN     [phone]
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
## 2012-09-11 MED ORDER — CLINDAMYCIN PHOSPHATE 900 MG/50ML IV SOLN
900.0000 mg | INTRAVENOUS | Status: AC
  Administered 2012-09-12: 900 mg via INTRAVENOUS
  Filled 2012-09-11: qty 50

## 2012-09-12 ENCOUNTER — Encounter (HOSPITAL_COMMUNITY)
Admission: RE | Disposition: A | Discharge status: Skilled Nursing Facility | Payer: Self-pay | Source: Ambulatory Visit | Attending: Orthopedic Surgery

## 2012-09-12 ENCOUNTER — Encounter (HOSPITAL_COMMUNITY): Payer: Self-pay | Admitting: Anesthesiology

## 2012-09-12 ENCOUNTER — Ambulatory Visit (HOSPITAL_COMMUNITY): Payer: Medicare Other

## 2012-09-12 ENCOUNTER — Inpatient Hospital Stay (HOSPITAL_COMMUNITY)
Admission: RE | Admit: 2012-09-12 | Discharge: 2012-09-14 | DRG: 470 | Disposition: A | Discharge status: Skilled Nursing Facility | Payer: Medicare Other | Source: Ambulatory Visit | Attending: Orthopedic Surgery | Admitting: Orthopedic Surgery

## 2012-09-12 ENCOUNTER — Other Ambulatory Visit (HOSPITAL_COMMUNITY): Payer: Medicare Other

## 2012-09-12 ENCOUNTER — Encounter (HOSPITAL_COMMUNITY): Payer: Medicare Other

## 2012-09-12 ENCOUNTER — Ambulatory Visit (HOSPITAL_COMMUNITY): Payer: Medicare Other | Admitting: Anesthesiology

## 2012-09-12 ENCOUNTER — Encounter (HOSPITAL_COMMUNITY): Payer: Self-pay | Admitting: *Deleted

## 2012-09-12 DIAGNOSIS — M169 Osteoarthritis of hip, unspecified: Principal | ICD-10-CM | POA: Diagnosis present

## 2012-09-12 DIAGNOSIS — E663 Overweight: Secondary | ICD-10-CM | POA: Diagnosis present

## 2012-09-12 DIAGNOSIS — D500 Iron deficiency anemia secondary to blood loss (chronic): Secondary | ICD-10-CM

## 2012-09-12 DIAGNOSIS — Z96649 Presence of unspecified artificial hip joint: Secondary | ICD-10-CM

## 2012-09-12 DIAGNOSIS — Z6825 Body mass index (BMI) 25.0-25.9, adult: Secondary | ICD-10-CM

## 2012-09-12 DIAGNOSIS — K219 Gastro-esophageal reflux disease without esophagitis: Secondary | ICD-10-CM | POA: Diagnosis present

## 2012-09-12 DIAGNOSIS — M1610 Unilateral primary osteoarthritis, unspecified hip: Principal | ICD-10-CM | POA: Diagnosis present

## 2012-09-12 DIAGNOSIS — D62 Acute posthemorrhagic anemia: Secondary | ICD-10-CM | POA: Diagnosis not present

## 2012-09-12 DIAGNOSIS — E871 Hypo-osmolality and hyponatremia: Secondary | ICD-10-CM

## 2012-09-12 DIAGNOSIS — I10 Essential (primary) hypertension: Secondary | ICD-10-CM | POA: Diagnosis present

## 2012-09-12 HISTORY — PX: TOTAL HIP ARTHROPLASTY: SHX124

## 2012-09-12 HISTORY — PX: OTHER SURGICAL HISTORY: SHX169

## 2012-09-12 LAB — TYPE AND SCREEN
ABO/RH(D): A POS
Antibody Screen: NEGATIVE

## 2012-09-12 LAB — PROTIME-INR: INR: 1.01 (ref 0.00–1.49)

## 2012-09-12 SURGERY — ARTHROPLASTY, HIP, TOTAL, ANTERIOR APPROACH
Anesthesia: Spinal | Site: Hip | Laterality: Left | Wound class: Clean

## 2012-09-12 MED ORDER — CLINDAMYCIN PHOSPHATE 900 MG/50ML IV SOLN
INTRAVENOUS | Status: AC
  Filled 2012-09-12: qty 50

## 2012-09-12 MED ORDER — PANTOPRAZOLE SODIUM 40 MG PO TBEC
40.0000 mg | DELAYED_RELEASE_TABLET | Freq: Every day | ORAL | Status: DC
  Administered 2012-09-13 – 2012-09-14 (×2): 40 mg via ORAL
  Filled 2012-09-12 (×2): qty 1

## 2012-09-12 MED ORDER — HYDROMORPHONE HCL PF 1 MG/ML IJ SOLN
0.5000 mg | INTRAMUSCULAR | Status: DC | PRN
Start: 2012-09-12 — End: 2012-09-14
  Administered 2012-09-12: 1 mg via INTRAVENOUS
  Filled 2012-09-12: qty 1

## 2012-09-12 MED ORDER — ONDANSETRON HCL 4 MG/2ML IJ SOLN
4.0000 mg | Freq: Four times a day (QID) | INTRAMUSCULAR | Status: DC | PRN
  Administered 2012-09-13 (×2): 4 mg via INTRAVENOUS
  Filled 2012-09-12 (×2): qty 2

## 2012-09-12 MED ORDER — DOCUSATE SODIUM 100 MG PO CAPS
100.0000 mg | ORAL_CAPSULE | Freq: Two times a day (BID) | ORAL | Status: DC
  Administered 2012-09-12 – 2012-09-14 (×4): 100 mg via ORAL

## 2012-09-12 MED ORDER — ALUM & MAG HYDROXIDE-SIMETH 200-200-20 MG/5ML PO SUSP: 30.0000 mL | ORAL | Status: DC | PRN

## 2012-09-12 MED ORDER — STERILE WATER FOR IRRIGATION IR SOLN
Status: DC | PRN
  Administered 2012-09-12: 3000 mL

## 2012-09-12 MED ORDER — DIPHENHYDRAMINE HCL 25 MG PO CAPS: 25.0000 mg | ORAL_CAPSULE | Freq: Four times a day (QID) | ORAL | Status: DC | PRN

## 2012-09-12 MED ORDER — METOCLOPRAMIDE HCL 10 MG PO TABS: 5.0000 mg | ORAL_TABLET | Freq: Three times a day (TID) | ORAL | Status: DC | PRN

## 2012-09-12 MED ORDER — BUPIVACAINE HCL (PF) 0.5 % IJ SOLN
INTRAMUSCULAR | Status: DC | PRN
  Administered 2012-09-12: 15 mg

## 2012-09-12 MED ORDER — POLYETHYLENE GLYCOL 3350 17 G PO PACK
17.0000 g | PACK | Freq: Two times a day (BID) | ORAL | Status: DC
  Administered 2012-09-12 – 2012-09-14 (×3): 17 g via ORAL

## 2012-09-12 MED ORDER — DEXAMETHASONE SODIUM PHOSPHATE 10 MG/ML IJ SOLN
INTRAMUSCULAR | Status: DC | PRN
  Administered 2012-09-12: 10 mg via INTRAVENOUS

## 2012-09-12 MED ORDER — MEPERIDINE HCL 50 MG/ML IJ SOLN: 6.2500 mg | INTRAMUSCULAR | Status: DC | PRN

## 2012-09-12 MED ORDER — HYDROMORPHONE HCL PF 1 MG/ML IJ SOLN
INTRAMUSCULAR | Status: AC
  Filled 2012-09-12: qty 1

## 2012-09-12 MED ORDER — PHENOL 1.4 % MT LIQD
1.0000 | OROMUCOSAL | Status: DC | PRN
  Filled 2012-09-12: qty 177

## 2012-09-12 MED ORDER — FLEET ENEMA 7-19 GM/118ML RE ENEM: 1.0000 | ENEMA | Freq: Once | RECTAL | Status: AC | PRN

## 2012-09-12 MED ORDER — ENOXAPARIN SODIUM 40 MG/0.4ML ~~LOC~~ SOLN
40.0000 mg | SUBCUTANEOUS | Status: DC
  Administered 2012-09-13 – 2012-09-14 (×2): 40 mg via SUBCUTANEOUS
  Filled 2012-09-12 (×3): qty 0.4

## 2012-09-12 MED ORDER — LACTATED RINGERS IV SOLN
INTRAVENOUS | Status: DC
  Administered 2012-09-12 (×2): 1000 mL via INTRAVENOUS
  Administered 2012-09-12: 18:00:00 via INTRAVENOUS

## 2012-09-12 MED ORDER — LACTATED RINGERS IV SOLN: INTRAVENOUS | Status: DC

## 2012-09-12 MED ORDER — HYDROCODONE-ACETAMINOPHEN 7.5-325 MG PO TABS
1.0000 | ORAL_TABLET | ORAL | Status: DC
  Administered 2012-09-12: 1 via ORAL
  Administered 2012-09-13 – 2012-09-14 (×5): 2 via ORAL
  Filled 2012-09-12 (×3): qty 2
  Filled 2012-09-12: qty 1
  Filled 2012-09-12 (×3): qty 2

## 2012-09-12 MED ORDER — EPHEDRINE SULFATE 50 MG/ML IJ SOLN
INTRAMUSCULAR | Status: DC | PRN
  Administered 2012-09-12: 10 mg via INTRAVENOUS
  Administered 2012-09-12: 5 mg via INTRAVENOUS

## 2012-09-12 MED ORDER — ASPIRIN 81 MG PO CHEW
81.0000 mg | CHEWABLE_TABLET | Freq: Every day | ORAL | Status: DC
  Administered 2012-09-13 – 2012-09-14 (×2): 81 mg via ORAL
  Filled 2012-09-12 (×2): qty 1

## 2012-09-12 MED ORDER — DEXTROSE 5 % IV SOLN
500.0000 mg | Freq: Four times a day (QID) | INTRAVENOUS | Status: DC | PRN
  Administered 2012-09-12: 500 mg via INTRAVENOUS
  Filled 2012-09-12: qty 5

## 2012-09-12 MED ORDER — DEXAMETHASONE SODIUM PHOSPHATE 10 MG/ML IJ SOLN
10.0000 mg | Freq: Once | INTRAMUSCULAR | Status: AC
  Administered 2012-09-13: 10 mg via INTRAVENOUS
  Filled 2012-09-12: qty 1

## 2012-09-12 MED ORDER — MENTHOL 3 MG MT LOZG
1.0000 | LOZENGE | OROMUCOSAL | Status: DC | PRN
  Filled 2012-09-12: qty 9

## 2012-09-12 MED ORDER — SODIUM CHLORIDE 0.9 % IV SOLN
100.0000 mL/h | INTRAVENOUS | Status: DC
  Administered 2012-09-12 – 2012-09-13 (×3): 100 mL/h via INTRAVENOUS
  Filled 2012-09-12 (×7): qty 1000

## 2012-09-12 MED ORDER — METHOCARBAMOL 500 MG PO TABS
500.0000 mg | ORAL_TABLET | Freq: Four times a day (QID) | ORAL | Status: DC | PRN
  Administered 2012-09-13 (×2): 500 mg via ORAL
  Filled 2012-09-12 (×2): qty 1

## 2012-09-12 MED ORDER — ONDANSETRON HCL 4 MG PO TABS
4.0000 mg | ORAL_TABLET | Freq: Four times a day (QID) | ORAL | Status: DC | PRN
  Administered 2012-09-13: 4 mg via ORAL
  Filled 2012-09-12: qty 1

## 2012-09-12 MED ORDER — ACETAMINOPHEN 10 MG/ML IV SOLN
INTRAVENOUS | Status: AC
  Filled 2012-09-12: qty 100

## 2012-09-12 MED ORDER — PROPOFOL 10 MG/ML IV BOLUS
INTRAVENOUS | Status: DC | PRN
  Administered 2012-09-12: 20 mg via INTRAVENOUS

## 2012-09-12 MED ORDER — ZOLPIDEM TARTRATE 5 MG PO TABS
5.0000 mg | ORAL_TABLET | Freq: Every evening | ORAL | Status: DC | PRN
  Administered 2012-09-13: 5 mg via ORAL
  Filled 2012-09-12: qty 1

## 2012-09-12 MED ORDER — WARFARIN - PHARMACIST DOSING INPATIENT: Freq: Every day | Status: DC

## 2012-09-12 MED ORDER — ACETAMINOPHEN 10 MG/ML IV SOLN
INTRAVENOUS | Status: DC | PRN
  Administered 2012-09-12: 1000 mg via INTRAVENOUS

## 2012-09-12 MED ORDER — WARFARIN SODIUM 7.5 MG PO TABS
7.5000 mg | ORAL_TABLET | Freq: Once | ORAL | Status: AC
  Administered 2012-09-12: 7.5 mg via ORAL
  Filled 2012-09-12: qty 1

## 2012-09-12 MED ORDER — PROPOFOL INFUSION 10 MG/ML OPTIME
INTRAVENOUS | Status: DC | PRN
  Administered 2012-09-12: 50 ug/kg/min via INTRAVENOUS

## 2012-09-12 MED ORDER — MIDAZOLAM HCL 5 MG/5ML IJ SOLN
INTRAMUSCULAR | Status: DC | PRN
  Administered 2012-09-12: 2 mg via INTRAVENOUS

## 2012-09-12 MED ORDER — STERILE WATER FOR IRRIGATION IR SOLN
Status: DC | PRN
  Administered 2012-09-12: 1000 mL

## 2012-09-12 MED ORDER — METOCLOPRAMIDE HCL 5 MG/ML IJ SOLN
5.0000 mg | Freq: Three times a day (TID) | INTRAMUSCULAR | Status: DC | PRN
Start: 2012-09-12 — End: 2012-09-14
  Administered 2012-09-13: 10 mg via INTRAVENOUS
  Filled 2012-09-12: qty 2

## 2012-09-12 MED ORDER — HYDROMORPHONE HCL PF 1 MG/ML IJ SOLN
0.2500 mg | INTRAMUSCULAR | Status: DC | PRN
  Administered 2012-09-12: 0.25 mg via INTRAVENOUS
  Administered 2012-09-12: 0.5 mg via INTRAVENOUS

## 2012-09-12 MED ORDER — CLINDAMYCIN PHOSPHATE 600 MG/50ML IV SOLN
600.0000 mg | Freq: Four times a day (QID) | INTRAVENOUS | Status: AC
  Administered 2012-09-12 – 2012-09-13 (×2): 600 mg via INTRAVENOUS
  Filled 2012-09-12 (×2): qty 50

## 2012-09-12 MED ORDER — PROMETHAZINE HCL 25 MG/ML IJ SOLN: 6.2500 mg | INTRAMUSCULAR | Status: DC | PRN

## 2012-09-12 MED ORDER — DEXAMETHASONE SODIUM PHOSPHATE 10 MG/ML IJ SOLN
10.0000 mg | Freq: Once | INTRAMUSCULAR | Status: DC
Start: 2012-09-12 — End: 2012-09-12

## 2012-09-12 MED ORDER — CELECOXIB 200 MG PO CAPS
200.0000 mg | ORAL_CAPSULE | Freq: Two times a day (BID) | ORAL | Status: DC
  Administered 2012-09-12 – 2012-09-14 (×4): 200 mg via ORAL
  Filled 2012-09-12 (×5): qty 1

## 2012-09-12 MED ORDER — FLECAINIDE ACETATE 100 MG PO TABS
100.0000 mg | ORAL_TABLET | Freq: Two times a day (BID) | ORAL | Status: DC
Start: 2012-09-12 — End: 2012-09-14
  Administered 2012-09-12 – 2012-09-14 (×4): 100 mg via ORAL
  Filled 2012-09-12 (×5): qty 1

## 2012-09-12 MED ORDER — FERROUS SULFATE 325 (65 FE) MG PO TABS
325.0000 mg | ORAL_TABLET | Freq: Three times a day (TID) | ORAL | Status: DC
  Administered 2012-09-12 – 2012-09-14 (×4): 325 mg via ORAL
  Filled 2012-09-12 (×8): qty 1

## 2012-09-12 MED ORDER — BISACODYL 10 MG RE SUPP: 10.0000 mg | Freq: Every day | RECTAL | Status: DC | PRN

## 2012-09-12 MED ORDER — BUPIVACAINE HCL (PF) 0.5 % IJ SOLN
INTRAMUSCULAR | Status: AC
  Filled 2012-09-12: qty 30

## 2012-09-12 SURGICAL SUPPLY — 38 items
ADH SKN CLS APL DERMABOND .7 (GAUZE/BANDAGES/DRESSINGS) ×1
BAG ZIPLOCK 12X15 (MISCELLANEOUS) ×4 IMPLANT
BLADE SAW SGTL 18X1.27X75 (BLADE) ×2 IMPLANT
CLOTH BEACON ORANGE TIMEOUT ST (SAFETY) ×2 IMPLANT
DERMABOND ADVANCED (GAUZE/BANDAGES/DRESSINGS) ×1
DERMABOND ADVANCED .7 DNX12 (GAUZE/BANDAGES/DRESSINGS) ×1 IMPLANT
DRAPE C-ARM 42X72 X-RAY (DRAPES) ×2 IMPLANT
DRAPE STERI IOBAN 125X83 (DRAPES) ×2 IMPLANT
DRAPE U-SHAPE 47X51 STRL (DRAPES) ×6 IMPLANT
DRSG AQUACEL AG ADV 3.5X10 (GAUZE/BANDAGES/DRESSINGS) IMPLANT
DRSG TEGADERM 4X4.75 (GAUZE/BANDAGES/DRESSINGS) ×4 IMPLANT
DURAPREP 26ML APPLICATOR (WOUND CARE) ×2 IMPLANT
ELECT BLADE TIP CTD 4 INCH (ELECTRODE) ×2 IMPLANT
ELECT REM PT RETURN 9FT ADLT (ELECTROSURGICAL) ×2
ELECTRODE REM PT RTRN 9FT ADLT (ELECTROSURGICAL) ×1 IMPLANT
EVACUATOR 1/8 PVC DRAIN (DRAIN) ×2 IMPLANT
FACESHIELD LNG OPTICON STERILE (SAFETY) ×8 IMPLANT
GAUZE SPONGE 2X2 8PLY STRL LF (GAUZE/BANDAGES/DRESSINGS) ×1 IMPLANT
GLOVE BIOGEL PI IND STRL 7.5 (GLOVE) ×1 IMPLANT
GLOVE BIOGEL PI IND STRL 8 (GLOVE) ×1 IMPLANT
GLOVE BIOGEL PI INDICATOR 7.5 (GLOVE) ×1
GLOVE BIOGEL PI INDICATOR 8 (GLOVE) ×1
GLOVE ECLIPSE 8.0 STRL XLNG CF (GLOVE) ×2 IMPLANT
GLOVE ORTHO TXT STRL SZ7.5 (GLOVE) ×4 IMPLANT
GOWN BRE IMP PREV XXLGXLNG (GOWN DISPOSABLE) ×2 IMPLANT
GOWN STRL NON-REIN LRG LVL3 (GOWN DISPOSABLE) ×2 IMPLANT
KIT BASIN OR (CUSTOM PROCEDURE TRAY) ×2 IMPLANT
PACK TOTAL JOINT (CUSTOM PROCEDURE TRAY) ×2 IMPLANT
PADDING CAST COTTON 6X4 STRL (CAST SUPPLIES) ×2 IMPLANT
SPONGE GAUZE 2X2 STER 10/PKG (GAUZE/BANDAGES/DRESSINGS) ×1
SUCTION FRAZIER 12FR DISP (SUCTIONS) ×2 IMPLANT
SUT MNCRL AB 4-0 PS2 18 (SUTURE) ×2 IMPLANT
SUT VIC AB 1 CT1 36 (SUTURE) ×8 IMPLANT
SUT VIC AB 2-0 CT1 27 (SUTURE) ×4
SUT VIC AB 2-0 CT1 TAPERPNT 27 (SUTURE) ×2 IMPLANT
SUT VLOC 180 0 24IN GS25 (SUTURE) ×2 IMPLANT
TOWEL OR 17X26 10 PK STRL BLUE (TOWEL DISPOSABLE) ×2 IMPLANT
TRAY FOLEY CATH 14FRSI W/METER (CATHETERS) ×2 IMPLANT

## 2012-09-12 NOTE — Anesthesia Preprocedure Evaluation (Signed)
Anesthesia Evaluation  Patient identified by MRN, date of birth, ID band Patient awake    Reviewed: Allergy & Precautions, H&P , NPO status , Patient's Chart, lab work & pertinent test results  Airway Mallampati: II TM Distance: >3 FB Neck ROM: Full    Dental No notable dental hx.    Pulmonary neg pulmonary ROS,  breath sounds clear to auscultation  Pulmonary exam normal       Cardiovascular hypertension, Pt. on medications negative cardio ROS  + dysrhythmias Atrial Fibrillation Rhythm:Regular Rate:Normal  AAA 3.3cm   Neuro/Psych negative neurological ROS  negative psych ROS   GI/Hepatic negative GI ROS, Neg liver ROS,   Endo/Other  negative endocrine ROS  Renal/GU negative Renal ROS  negative genitourinary   Musculoskeletal negative musculoskeletal ROS (+)   Abdominal   Peds negative pediatric ROS (+)  Hematology negative hematology ROS (+)   Anesthesia Other Findings Upper and lower front caps  Reproductive/Obstetrics negative OB ROS                           Anesthesia Physical Anesthesia Plan  ASA: III  Anesthesia Plan: Spinal   Post-op Pain Management:    Induction:   Airway Management Planned: Simple Face Mask  Additional Equipment:   Intra-op Plan:   Post-operative Plan:   Informed Consent: I have reviewed the patients History and Physical, chart, labs and discussed the procedure including the risks, benefits and alternatives for the proposed anesthesia with the patient or authorized representative who has indicated his/her understanding and acceptance.   Dental advisory given  Plan Discussed with: CRNA  Anesthesia Plan Comments:         Anesthesia Quick Evaluation

## 2012-09-12 NOTE — Plan of Care (Signed)
Problem: Consults Goal: Diagnosis- Total Joint Replacement Outcome: Completed/Met Date Met:  09/12/12 Primary Total Hip

## 2012-09-12 NOTE — Transfer of Care (Signed)
Immediate Anesthesia Transfer of Care Note  Patient: Tyrone Mitchell  Procedure(s) Performed: Procedure(s) (LRB): TOTAL HIP ARTHROPLASTY ANTERIOR APPROACH (Left)  Patient Location: PACU  Anesthesia Type: Spinal  Level of Consciousness: sedated, patient cooperative and responds to stimulaton  Airway & Oxygen Therapy: Patient Spontanous Breathing and Patient connected to face mask oxgen  Post-op Assessment: Report given to PACU RN and Post -op Vital signs reviewed and stable  Post vital signs: Reviewed and stable  Complications: No apparent anesthesia complications S1 sensory with no motor to lower ext on command patient denied pain on exam VSS

## 2012-09-12 NOTE — Anesthesia Postprocedure Evaluation (Signed)
  Anesthesia Post-op Note  Patient: Tyrone Mitchell  Procedure(s) Performed: Procedure(s) (LRB): TOTAL HIP ARTHROPLASTY ANTERIOR APPROACH (Left)  Patient Location: PACU  Anesthesia Type: Spinal  Level of Consciousness: awake and alert   Airway and Oxygen Therapy: Patient Spontanous Breathing  Post-op Pain: mild  Post-op Assessment: Post-op Vital signs reviewed, Patient's Cardiovascular Status Stable, Respiratory Function Stable, Patent Airway and No signs of Nausea or vomiting  Last Vitals:  Filed Vitals:   09/12/12 1904  BP: 141/74  Pulse: 70  Temp: 36.4 C  Resp: 14    Post-op Vital Signs: stable   Complications: No apparent anesthesia complications

## 2012-09-12 NOTE — Anesthesia Procedure Notes (Signed)
Spinal  Patient location during procedure: OR Start time: 09/12/2012 4:05 PM End time: 09/12/2012 4:15 PM Staffing Anesthesiologist: Phillips Grout CRNA/Resident: Paris Lore Performed by: anesthesiologist and resident/CRNA  Preanesthetic Checklist Completed: patient identified, site marked, surgical consent, pre-op evaluation, timeout performed, IV checked, risks and benefits discussed and monitors and equipment checked Spinal Block Patient position: sitting Prep: Betadine Patient monitoring: heart rate, continuous pulse ox and blood pressure Approach: right paramedian Location: L2-3 Injection technique: single-shot Needle Needle type: Spinocan  Needle gauge: 22 G Needle length: 9 cm Assessment Sensory level: T4 Additional Notes Expiration date of kit checked and confirmed. Patient tolerated procedure well, without complications. X 2- first attempt per [facility] CRNA without success, MD Joseph Pierini 1 with success noted clear CSF return with ease aspiration and administration of local.  Noted T-4 level on exam prior to deeper sedation.

## 2012-09-12 NOTE — Progress Notes (Signed)
ANTICOAGULATION CONSULT NOTE - Initial Consult  Pharmacy Consult for Warfarin Indication: chest pain/ACS and atrial fibrillation  Allergies  Allergen Reactions  . Penicillins Other (See Comments)    Swelling of the mouth    Patient Measurements: Height = 74 inches (09/06/12) Weight = 104.2 kg (09/06/12)   Vital Signs: Temp: 97.6 F (36.4 C) (12/03 1904) Temp src: Oral (12/03 1304) BP: 141/74 mmHg (12/03 1904) Pulse Rate: 70  (12/03 1904)  Labs:  Basename 09/12/12 1320  HGB --  HCT --  PLT --  APTT --  LABPROT 13.2  INR 1.01  HEPARINUNFRC --  CREATININE --  CKTOTAL --  CKMB --  TROPONINI --    The CrCl is unknown because both a height and weight (above a minimum accepted value) are required for this calculation.   Medical History: Past Medical History  Diagnosis Date  . Atrial fibrillation   . GERD (gastroesophageal reflux disease)   . Hyperlipidemia   . Anemia   . Cancer     Medications:  Scheduled:    . aspirin  81 mg Oral Daily  . celecoxib  200 mg Oral Q12H  . clindamycin (CLEOCIN) IV  600 mg Intravenous Q6H  . [COMPLETED] clindamycin (CLEOCIN) IV  900 mg Intravenous 60 min Pre-Op  . dexamethasone  10 mg Intravenous Once  . docusate sodium  100 mg Oral BID  . enoxaparin (LOVENOX) injection  40 mg Subcutaneous Q24H  . ferrous sulfate  325 mg Oral TID PC  . flecainide  100 mg Oral BID  . HYDROcodone-acetaminophen  1-2 tablet Oral Q4H  . HYDROmorphone      . pantoprazole  40 mg Oral Daily  . polyethylene glycol  17 g Oral BID  . [DISCONTINUED] dexamethasone  10 mg Intravenous Once   Infusions:    . sodium chloride 0.9 % 1,000 mL with potassium chloride 10 mEq infusion    . [DISCONTINUED] lactated ringers 1,000 mL (09/12/12 1848)  . [DISCONTINUED] lactated ringers      Assessment:  71 yr old male on warfarin PTA for H/O Afib  PTA warfarin regimen = 5 mg daily except NONE on Tues/Thur.  INR = 1.68 (09/06/12)  S/P left THA  09/12/12  Warfarin on hold in anticipation of surgery.  INR today = 1.01  Warfarin per pharmacy to resume tonight  Goal of Therapy:  INR 2-3    Plan:   Warfarin 7.5 mg po x 1 tonight  Lovenox 40mg  daily until INR > 1.8 per MD orders (to begin 09/13/12)  Daily PT/INR  Kazia Grisanti, Joselyn Glassman, PharmD 09/12/2012,7:29 PM

## 2012-09-12 NOTE — Op Note (Signed)
NAME:  Tyrone Mitchell                ACCOUNT NO.: [id number]      MEDICAL RECORD NO.: [id number]      FACILITY:  [facility]      PHYSICIAN:  Durene Romans D  DATE OF BIRTH:  Feb 17, 1941     DATE OF PROCEDURE:  09/12/2012                                 OPERATIVE REPORT         PREOPERATIVE DIAGNOSIS: Left  hip osteoarthritis.      POSTOPERATIVE DIAGNOSIS:  Left hip osteoarthritis. History of right THR     PROCEDURE:  Left total hip replacement through an anterior approach   utilizing DePuy THR system, component size 58mm pinnacle cup, a size 36+4 neutral   Altrex liner, a size 9 Hi Tri Lock stem with a 36+5 delta ceramic   ball.      SURGEON:  Madlyn Frankel. Charlann Boxer, M.D.      ASSISTANT:  Lanney Gins, PA      ANESTHESIA:  Spinal.      SPECIMENS:  None.      COMPLICATIONS:  None.      BLOOD LOSS:  400 cc     DRAINS:  One Hemovac.      INDICATION OF THE PROCEDURE:  Tyrone Mitchell is a 71 y.o. male who had   presented to office for evaluation of right hip pain.  Radiographs revealed   progressive degenerative changes with bone-on-bone   articulation to the  hip joint.  The patient had painful limited range of   motion significantly affecting their overall quality of life.  The patient was failing to    respond to conservative measures, and at this point was ready   to proceed with more definitive measures.  The patient has noted progressive   degenerative changes in his hip, progressive problems and dysfunction   with regarding the hip prior to surgery.  Consent was obtained for   benefit of pain relief.  Specific risk of infection, DVT, component   failure, dislocation, need for revision surgery, as well discussion of   the anterior versus posterior approach were reviewed.  Consent was   obtained for benefit of anterior pain relief through an anterior   approach.      PROCEDURE IN DETAIL:  The patient was brought to operative theater.   Once  adequate anesthesia, preoperative antibiotics, 2gm Ancef administered.   The patient was positioned supine on the OSI Hanna table.  Once adequate   padding of boney process was carried out, we had predraped out the hip, and  used fluoroscopy to confirm orientation of the pelvis and position.      The left hip was then prepped and draped from proximal iliac crest to   mid thigh with shower curtain technique.      Time-out was performed identifying the patient, planned procedure, and   extremity.     An incision was then made 2 cm distal and lateral to the   anterior superior iliac spine extending over the orientation of the   tensor fascia lata muscle and sharp dissection was carried down to the   fascia of the muscle and protractor placed in the soft tissues.      The fascia was then incised.  The muscle belly  was identified and swept   laterally and retractor placed along the superior neck.  Following   cauterization of the circumflex vessels and removing some pericapsular   fat, a second cobra retractor was placed on the inferior neck.  A third   retractor was placed on the anterior acetabulum after elevating the   anterior rectus.  A L-capsulotomy was along the line of the   superior neck to the trochanteric fossa, then extended proximally and   distally.  Tag sutures were placed and the retractors were then placed   intracapsular.  We then identified the trochanteric fossa and   orientation of my neck cut, confirmed this radiographically   and then made a neck osteotomy with the femur on traction.  The femoral   head was removed without difficulty or complication.  Traction was let   off and retractors were placed posterior and anterior around the   acetabulum.      The labrum and foveal tissue were debrided.  I began reaming with a 51mm   reamer and reamed up to 57mm reamer with good bony bed preparation and a 58   cup was chosen.  The final 58mm Pinnacle cup was then impacted  under fluoroscopy  to confirm the depth of penetration and orientation with respect to   abduction.  A screw was placed followed by the hole eliminator.  The final   36+4 Altrex liner was impacted with good visualized rim fit.  The cup was positioned anatomically within the acetabular portion of the pelvis.      At this point, the femur was rolled at 80 degrees.  Further capsule was   released off the inferior aspect of the femoral neck.  I then   released the superior capsule proximally.  The hook was placed laterally   along the femur and elevated manually and held in position with the bed   hook.  The leg was then extended and adducted with the leg rolled to 100   degrees of external rotation.  Once the proximal femur was fully   exposed, I used a box osteotome to set orientation.  I then began   broaching with the starting chili pepper broach and passed this by hand and then broached up to 9.  With the 9 broach in place I chose a high offset neck and did a trial reduction first with a 36+1.5 then a +5 trial head.  The offset was appropriate, leg lengths appeared to be equal, confirmed radiographically.   Given these findings, I went ahead and dislocated the hip, repositioned all   retractors and positioned the right hip in the extended and abducted position.  The final 9 Hi Tri Lock stem was   chosen and it was impacted down to the level of neck cut.  Based on this   and the trial reduction, a 36+5 delta ceramic ball was chosen and   impacted onto a clean and dry trunnion, and the hip was reduced.  The   hip had been irrigated throughout the case again at this point.  I did   reapproximate the superior capsular leaflet to the anterior leaflet   using #1 Vicryl, placed a medium Hemovac drain deep.  The fascia of the   tensor fascia lata muscle was then reapproximated using #1 Vicryl.  The   remaining wound was closed with 2-0 Vicryl and running 4-0 Monocryl.   The hip was cleaned, dried,  and dressed sterilely using Dermabond and  Aquacel dressing.  Drain site dressed separately.  She was then brought   to recovery room in stable condition tolerating the procedure well.    Lanney Gins, PA-C was present for the entirety of the case involved from   preoperative positioning, perioperative retractor management, general   facilitation of the case, as well as primary wound closure as assistant.            Madlyn Frankel Charlann Boxer, M.D.            MDO/MEDQ  D:  08/03/2011  T:  08/03/2011  Job:  [id number]      Electronically Signed by Durene Romans M.D. on 08/09/2011 09:15:38 AM

## 2012-09-12 NOTE — Interval H&P Note (Signed)
History and Physical Interval Note:  09/12/2012 3:55 PM  Tyrone Mitchell  has presented today for surgery, with the diagnosis of Osteoarthritis of the Left Hip  The various methods of treatment have been discussed with the patient and family. After consideration of risks, benefits and other options for treatment, the patient has consented to  Procedure(s) (LRB) with comments: TOTAL HIP ARTHROPLASTY ANTERIOR APPROACH (Left) as a surgical intervention .  The patient's history has been reviewed, patient examined, no change in status, stable for surgery.  I have reviewed the patient's chart and labs.  Questions were answered to the patient's satisfaction.     Shelda Pal

## 2012-09-13 DIAGNOSIS — E871 Hypo-osmolality and hyponatremia: Secondary | ICD-10-CM

## 2012-09-13 DIAGNOSIS — D500 Iron deficiency anemia secondary to blood loss (chronic): Secondary | ICD-10-CM

## 2012-09-13 DIAGNOSIS — E663 Overweight: Secondary | ICD-10-CM

## 2012-09-13 LAB — BASIC METABOLIC PANEL
CO2: 21 mEq/L (ref 19–32)
Glucose, Bld: 213 mg/dL — ABNORMAL HIGH (ref 70–99)
Potassium: 4.3 mEq/L (ref 3.5–5.1)
Sodium: 134 mEq/L — ABNORMAL LOW (ref 135–145)

## 2012-09-13 LAB — CBC
Hemoglobin: 11 g/dL — ABNORMAL LOW (ref 13.0–17.0)
RBC: 3.48 MIL/uL — ABNORMAL LOW (ref 4.22–5.81)

## 2012-09-13 LAB — PROTIME-INR: Prothrombin Time: 13.9 seconds (ref 11.6–15.2)

## 2012-09-13 MED ORDER — PROMETHAZINE HCL 25 MG PO TABS: 12.5000 mg | ORAL_TABLET | Freq: Four times a day (QID) | ORAL | Status: DC | PRN

## 2012-09-13 MED ORDER — WARFARIN SODIUM 7.5 MG PO TABS
7.5000 mg | ORAL_TABLET | Freq: Once | ORAL | Status: AC
  Administered 2012-09-13: 7.5 mg via ORAL
  Filled 2012-09-13: qty 1

## 2012-09-13 MED ORDER — PROMETHAZINE HCL 25 MG/ML IJ SOLN
12.5000 mg | Freq: Four times a day (QID) | INTRAMUSCULAR | Status: DC | PRN
  Administered 2012-09-13: 12.5 mg via INTRAVENOUS
  Filled 2012-09-13: qty 1

## 2012-09-13 NOTE — Progress Notes (Signed)
Utilization review completed.  

## 2012-09-13 NOTE — Progress Notes (Signed)
   Subjective: 1 Day Post-Op Procedure(s) (LRB): TOTAL HIP ARTHROPLASTY ANTERIOR APPROACH (Left)   Patient reports pain as mild, pain well controlled. C/o nausea and vomiting, but otherwise no events throughout the night.   Objective:   VITALS:   Filed Vitals:   09/13/12 0523  BP: 109/72  Pulse: 72  Temp: 97.4 F (36.3 C)  Resp: 18    Neurovascular intact Dorsiflexion/Plantar flexion intact Incision: dressing C/D/I No cellulitis present Compartment soft  LABS  Basename 09/13/12 0450  HGB 11.0*  HCT 32.5*  WBC 11.7*  PLT 140*     Basename 09/13/12 0450  NA 134*  K 4.3  BUN 18  CREATININE 0.91  GLUCOSE 213*     Assessment/Plan: 1 Day Post-Op Procedure(s) (LRB): TOTAL HIP ARTHROPLASTY ANTERIOR APPROACH (Left) HV drain d/c'ed Foley cath d/c'ed Adding Phenergan to help with nausea Advance diet Up with therapy  Expected ABLA  Treated with iron and will observe  Overweight (BMI 25-29.9)  Estimated Body mass index is 29.53 kg/(m^2) as calculated from the following:   Height as of this encounter: 6\' 2" (1.88 m).   Weight as of this encounter: 230 lb(104.327 kg). Patient also counseled that weight may inhibit the healing process Patient counseled that losing weight will help with future health issues  Hyponatremia Treated with IV fluids and will observe      Anastasio Auerbach. Nishaan Stanke   PAC  09/13/2012, 9:52 AM

## 2012-09-13 NOTE — Evaluation (Signed)
Physical Therapy Evaluation Patient Details Name: Tyrone Mitchell MRN: [id number] DOB: 11-19-1940 Today's Date: 09/13/2012 Time: 4098-1191 PT Time Calculation (min): 21 min  PT Assessment / Plan / Recommendation Clinical Impression  Pt s/p L direct anterior THR.  Pt reports R THR years ago however recently giving him increased pain and reports MD believes it is due to overcompensating for L hip.  Pt would benefit from acute PT services in order to improve independence with transfers and ambulation.  Pt states he lives alone but my be able to have sons stay with him.  Recommend supervision for mobility, so if not home, may need ST-SNF depending on progress.    PT Assessment  Patient needs continued PT services    Follow Up Recommendations  SNF;Supervision for mobility/OOB    Does the patient have the potential to tolerate intense rehabilitation      Barriers to Discharge        Equipment Recommendations  None recommended by PT    Recommendations for Other Services     Frequency 7X/week    Precautions / Restrictions Precautions Precautions: None Restrictions LLE Weight Bearing: Weight bearing as tolerated   Pertinent Vitals/Pain 2/10 L hip pain, RN aware, repositioned to comfort in recliner      Mobility  Bed Mobility Bed Mobility: Supine to Sit Supine to Sit: 4: Min assist Details for Bed Mobility Assistance: slight assist for L LE, pt also used UEs to assist L LE off bed Transfers Transfers: Stand to Sit;Sit to Stand Sit to Stand: 4: Min guard;From bed;With upper extremity assist Stand to Sit: 4: Min guard;To chair/3-in-1;With upper extremity assist Details for Transfer Assistance: verbal cues for safe technique Ambulation/Gait Ambulation/Gait Assistance: 4: Min guard Ambulation Distance (Feet): 60 Feet Assistive device: Rolling walker Ambulation/Gait Assistance Details: verbal cues for sequence, step length, RW distance, posture, pt able to perform step through  pattern 30 feet on returning to room Gait Pattern: Step-to pattern;Step-through pattern;Antalgic;Trunk flexed Gait velocity: decreased    Shoulder Instructions     Exercises     PT Diagnosis: Acute pain;Difficulty walking  PT Problem List: Decreased strength;Decreased mobility;Pain;Decreased knowledge of use of DME PT Treatment Interventions: DME instruction;Gait training;Stair training;Functional mobility training;Therapeutic activities;Therapeutic exercise;Patient/family education (stairs if home)   PT Goals Acute Rehab PT Goals PT Goal Formulation: With patient Time For Goal Achievement: 09/20/12 Potential to Achieve Goals: Good Pt will go Supine/Side to Sit: with modified independence PT Goal: Supine/Side to Sit - Progress: Goal set today Pt will go Sit to Stand: with modified independence PT Goal: Sit to Stand - Progress: Goal set today Pt will go Stand to Sit: with modified independence PT Goal: Stand to Sit - Progress: Goal set today Pt will Ambulate: 51 - 150 feet;with modified independence;with least restrictive assistive device PT Goal: Ambulate - Progress: Goal set today Pt will Perform Home Exercise Program: with supervision, verbal cues required/provided PT Goal: Perform Home Exercise Program - Progress: Goal set today  Visit Information  Last PT Received On: 09/13/12 Assistance Needed: +1    Subjective Data  Subjective: Let's see how I do.   Prior Functioning  Home Living Lives With: Alone Type of Home: House Home Access: Stairs to enter Entrance Stairs-Number of Steps: 2 Entrance Stairs-Rails: None Home Layout: One level Home Adaptive Equipment: Bedside commode/3-in-1;Straight cane;Walker - rolling Prior Function Level of Independence: Independent Communication Communication: No difficulties    Cognition  Overall Cognitive Status: Appears within functional limits for tasks assessed/performed Arousal/Alertness: Awake/alert Orientation  Level: Appears  intact for tasks assessed Behavior During Session: [facility] for tasks performed    Extremity/Trunk Assessment Right Upper Extremity Assessment RUE ROM/Strength/Tone: [facility] for tasks assessed Left Upper Extremity Assessment LUE ROM/Strength/Tone: WFL for tasks assessed Right Lower Extremity Assessment RLE ROM/Strength/Tone: [facility] for tasks assessed Left Lower Extremity Assessment LLE ROM/Strength/Tone: Deficits LLE ROM/Strength/Tone Deficits: decreased active hip movement against gravity per functional observation   Balance    End of Session PT - End of Session Equipment Utilized During Treatment: Gait belt Activity Tolerance: Patient tolerated treatment well Patient left: in chair;with call bell/phone within reach Nurse Communication: Mobility status (RN observed pt in hallway ambulating)  GP     Samyah Bilbo,KATHrine E 09/13/2012, 12:42 PM Pager: [phone]

## 2012-09-13 NOTE — Progress Notes (Signed)
ANTICOAGULATION CONSULT NOTE - Follow Up Consult  Pharmacy Consult for Coumadin Indication: H/o atrial fibrillation, VTE prophylaxis s/p L TKA   Allergies  Allergen Reactions  . Penicillins Other (See Comments)    Swelling of the mouth    Patient Measurements: Height: 6\' 2"  (188 cm) Weight: 230 lb (104.327 kg) IBW/kg (Calculated) : 82.2   Labs:  Basename 09/13/12 0450 09/12/12 1320  HGB 11.0* --  HCT 32.5* --  PLT 140* --  APTT -- --  LABPROT 13.9 13.2  INR 1.08 1.01  HEPARINUNFRC -- --  CREATININE 0.91 --  CKTOTAL -- --  CKMB -- --  TROPONINI -- --    Assessment:  71 yom on Coumadin PTA for h/o atrial fibrillation.  Patient was reportedly on Coumadin 5mg  po daily EXCEPT no Coumadin on Tues/Thur.  Coumadin was placed on hold for L TKA on 09/12/2012 and Coumadin resumed post-op.  MD also ordered Lovenox 40 mg sq daily until INR > 1.8 (to begin 12/4 AM)  INR 1.08 today, as expected with Coumadin re-initiation.  Patient also on Aspirin, Celebrex and Lovenox 40mg /day, all of which can increase the risk of bleeding while on Coumadin.   CBC okay, no bleeding/complications from anticoagulation documented.   Goal of Therapy:  INR 2-3 Monitor platelets by anticoagulation protocol: Yes   Plan:   Repeat Coumadin 7.5 mg po x 1 for one more day  Continue Lovenox 40 mg/day until INR >/= 1.8  Daily PT/INR  Pharmacy will f/u  Geoffry Paradise, PharmD, BCPS Pager: [phone] 8:10 AM Pharmacy #: [id number]

## 2012-09-13 NOTE — Progress Notes (Signed)
Clinical Social Work Department CLINICAL SOCIAL WORK PLACEMENT NOTE 09/13/2012  Patient:  Tyrone Mitchell, Tyrone Mitchell  Account Number:  [id number] Admit date:  09/12/2012  Clinical Social Worker:  Cori Razor, LCSW  Date/time:  09/13/2012 01:56 PM  Clinical Social Work is seeking post-discharge placement for this patient at the following level of care:   SKILLED NURSING   (*CSW will update this form in Epic as items are completed)   09/13/2012  Patient/family provided with [facility] Department of Clinical Social Work's list of facilities offering this level of care within the geographic area requested by the patient (or if unable, by the patient's family).  09/13/2012  Patient/family informed of their freedom to choose among providers that offer the needed level of care, that participate in Medicare, Medicaid or managed care program needed by the patient, have an available bed and are willing to accept the patient.  09/13/2012  Patient/family informed of [facility]' ownership interest in [facility], as well as of the fact that they are under no obligation to receive care at this facility.  PASARR submitted to [employer] on  PASARR number received from [employer] on 08/19/2011  FL2 transmitted to all facilities in geographic area requested by pt/family on  09/13/2012 FL2 transmitted to all facilities within larger geographic area on   Patient informed that his/her managed care company has contracts with or will negotiate with  certain facilities, including the following:     Patient/family informed of bed offers received:   Patient chooses bed at  Physician recommends and patient chooses bed at    Patient to be transferred to  on   Patient to be transferred to facility by   The following physician request were entered in Epic:   Additional Comments:  Cori Razor LCSW [phone]

## 2012-09-13 NOTE — Progress Notes (Signed)
Clinical Social Work Department BRIEF PSYCHOSOCIAL ASSESSMENT 09/13/2012  Patient:  Tyrone Mitchell, Tyrone Mitchell     Account Number:  [id number]     Admit date:  09/12/2012  Clinical Social Worker:  Candie Chroman  Date/Time:  09/13/2012 01:36 PM  Referred by:  Physician  Date Referred:  09/13/2012 Referred for  SNF Placement   Other Referral:   Interview type:  Patient Other interview type:    PSYCHOSOCIAL DATA Living Status:  ALONE Admitted from facility:   Level of care:   Primary support name:  Tyrone Mitchell Primary support relationship to patient:  CHILD, ADULT Degree of support available:   limited    CURRENT CONCERNS Current Concerns  Post-Acute Placement   Other Concerns:    SOCIAL WORK ASSESSMENT / PLAN Pt is a 71 yr old gentleman living at home prior to hospitalization. CSW met with pt to assist with d/c planning. Pt needs ST Rehab prior to returning home. Pt is in agreement with this d/c plan. SNF search has been initiated and bed offer swill be provided as received.   Assessment/plan status:  Psychosocial Support/Ongoing Assessment of Needs Other assessment/ plan:   Information/referral to community resources:   SNF list provided.    PATIENT'S/FAMILY'S RESPONSE TO PLAN OF CARE: Pt was hoping to return home but is willing to accept ST Rehab.    Cori Razor LCSW [phone]

## 2012-09-13 NOTE — Progress Notes (Signed)
Physical Therapy Treatment Note   09/13/12 1500  PT Visit Information  Last PT Received On 09/13/12  Assistance Needed +1  PT Time Calculation  PT Start Time 1455  PT Stop Time 1520  PT Time Calculation (min) 25 min  Subjective Data  Subjective I took a good nap.  Precautions  Precautions None  Restrictions  LLE Weight Bearing WBAT  Cognition  Overall Cognitive Status Appears within functional limits for tasks assessed/performed  Bed Mobility  Bed Mobility Sit to Supine  Sit to Supine 5: Supervision  Details for Bed Mobility Assistance verbal cues for technique, pt used UEs to assist L LE onto bed  Transfers  Transfers Stand to Sit;Sit to Stand  Sit to Stand 4: Min guard;With upper extremity assist;From chair/3-in-1  Stand to Sit 4: Min guard;With upper extremity assist;To bed  Details for Transfer Assistance verbal cues for safe technique  Ambulation/Gait  Ambulation/Gait Assistance 4: Min guard  Ambulation Distance (Feet) 140 Feet  Assistive device Rolling walker  Ambulation/Gait Assistance Details verbal cues for sequence, RW distance and step length, pt tends to keep RW too far forward (he states that what he was taught with a previous surgery)  Gait Pattern Step-through pattern;Antalgic;Trunk flexed  Gait velocity decreased  Exercises  Exercises Total Joint  Total Joint Exercises  Ankle Circles/Pumps AROM;20 reps;Both  [employer] reps;Both  Gluteal Sets AROM;Both;20 reps  Towel Squeeze AROM;15 reps;Both  Short Arc Quad AROM;Left;15 reps  Heel Slides AAROM;Left;15 reps  Hip ABduction/ADduction AROM;Left;15 reps  Straight Leg Raises AAROM;Left;10 reps  PT - End of Session  Activity Tolerance Patient tolerated treatment well  Patient left in bed;with call bell/phone within reach  PT - Assessment/Plan  Comments on Treatment Session Pt ambulated in hallway again this afternoon and then performed exercises in bed.  Pt reports he decided SNF would be best prior  to home.  Pt reported 3/10 L hip pain with mobility and exercises and LE will feel better upon resting.  PT Plan Discharge plan remains appropriate;Frequency remains appropriate  Follow Up Recommendations SNF;Supervision for mobility/OOB  Equipment Recommended None recommended by PT  Acute Rehab PT Goals  PT Goal: Sit to Stand - Progress Progressing toward goal  PT Goal: Stand to Sit - Progress Progressing toward goal  PT Goal: Ambulate - Progress Progressing toward goal  PT Goal: Perform Home Exercise Program - Progress Progressing toward goal  PT General Charges  $$ ACUTE PT VISIT 1 Procedure  PT Treatments  $Gait Training 8-22 mins  $Therapeutic Exercise 8-22 mins    Zenovia Jarred, PT Pager: [phone]

## 2012-09-13 NOTE — OR Nursing (Signed)
Late entry, procedure end time, Jannett Celestine, RN

## 2012-09-14 ENCOUNTER — Encounter (HOSPITAL_COMMUNITY): Payer: Self-pay | Admitting: Orthopedic Surgery

## 2012-09-14 LAB — CBC
MCH: 32.3 pg (ref 26.0–34.0)
MCHC: 34.2 g/dL (ref 30.0–36.0)
MCV: 94.2 fL (ref 78.0–100.0)
Platelets: 150 10*3/uL (ref 150–400)
RBC: 3.1 MIL/uL — ABNORMAL LOW (ref 4.22–5.81)

## 2012-09-14 LAB — BASIC METABOLIC PANEL
CO2: 25 mEq/L (ref 19–32)
Calcium: 8.6 mg/dL (ref 8.4–10.5)
Creatinine, Ser: 1.03 mg/dL (ref 0.50–1.35)
GFR calc Af Amer: 83 mL/min — ABNORMAL LOW (ref >90)
GFR calc non Af Amer: 72 mL/min — ABNORMAL LOW (ref >90)
Sodium: 136 mEq/L (ref 135–145)

## 2012-09-14 LAB — PROTIME-INR
INR: 1.54 — ABNORMAL HIGH (ref 0.00–1.49)
Prothrombin Time: 18 seconds — ABNORMAL HIGH (ref 11.6–15.2)

## 2012-09-14 MED ORDER — ENOXAPARIN SODIUM 40 MG/0.4ML ~~LOC~~ SOLN: 40.0000 mg | SUBCUTANEOUS | Status: DC

## 2012-09-14 MED ORDER — WARFARIN SODIUM 5 MG PO TABS: 5.0000 mg | ORAL_TABLET | ORAL | Status: DC

## 2012-09-14 MED ORDER — DIPHENHYDRAMINE HCL 25 MG PO CAPS: 25.0000 mg | ORAL_CAPSULE | Freq: Four times a day (QID) | ORAL | Status: DC | PRN

## 2012-09-14 MED ORDER — DSS 100 MG PO CAPS: 100.0000 mg | ORAL_CAPSULE | Freq: Two times a day (BID) | ORAL | Status: DC

## 2012-09-14 MED ORDER — POLYETHYLENE GLYCOL 3350 17 G PO PACK: 17.0000 g | PACK | Freq: Two times a day (BID) | ORAL | Status: DC

## 2012-09-14 MED ORDER — METHOCARBAMOL 500 MG PO TABS: 500.0000 mg | ORAL_TABLET | Freq: Four times a day (QID) | ORAL | Status: DC | PRN

## 2012-09-14 MED ORDER — ENOXAPARIN (LOVENOX) PATIENT EDUCATION KIT: PACK | Freq: Once | Status: DC

## 2012-09-14 MED ORDER — FERROUS SULFATE 325 (65 FE) MG PO TABS: 325.0000 mg | ORAL_TABLET | Freq: Three times a day (TID) | ORAL | Status: DC

## 2012-09-14 MED ORDER — HYDROCODONE-ACETAMINOPHEN 7.5-325 MG PO TABS: 1.0000 | ORAL_TABLET | ORAL | Status: DC | PRN

## 2012-09-14 NOTE — Progress Notes (Signed)
Physical Therapy Treatment Patient Details Name: Tyrone Mitchell MRN: [id number] DOB: 1940-11-18 Today's Date: 09/14/2012 Time: 8295-6213 PT Time Calculation (min): 17 min  PT Assessment / Plan / Recommendation Comments on Treatment Session  Pt progressing well and plans to D/C to SNF today.    Follow Up Recommendations  SNF     Does the patient have the potential to tolerate intense rehabilitation     Barriers to Discharge        Equipment Recommendations  None recommended by OT;None recommended by PT    Recommendations for Other Services    Frequency 7X/week   Plan Discharge plan remains appropriate    Precautions / Restrictions Precautions Precautions: None Precaution Comments: Direct Anterior THR Restrictions Weight Bearing Restrictions: No LLE Weight Bearing: Weight bearing as tolerated   Pertinent Vitals/Pain C/o "tightness" ICE applied    Mobility  Bed Mobility Bed Mobility: Not assessed Details for Bed Mobility Assistance: OOB in recliner Transfers Transfers: Sit to Stand;Stand to Sit Sit to Stand: 5: Supervision;From chair/3-in-1 Stand to Sit: 5: Supervision;To chair/3-in-1 Details for Transfer Assistance: increased time Ambulation/Gait Ambulation/Gait Assistance: 4: Min guard Ambulation Distance (Feet): 185 Feet Assistive device: Rolling walker Ambulation/Gait Assistance Details: <25% VC's on safety with turns and backward gait Gait Pattern: Step-through pattern Gait velocity: decreased          PT Goals                                                    Progressing well    Visit Information  Last PT Received On: 09/14/12 Assistance Needed: +1    Subjective Data      Cognition       Balance     End of Session PT - End of Session Equipment Utilized During Treatment: Gait belt Activity Tolerance: Patient tolerated treatment well Patient left: in chair;with call bell/phone within reach;with family/visitor present;Other (comment) (ICE  to L hip)  Felecia Shelling  PTA [facility]  Acute  Rehab Pager     [phone]

## 2012-09-14 NOTE — Progress Notes (Signed)
Pt stable, scripts, d/c instructions given with no questions/concerns voiced by pt or family.  Cousin will assist pt at home with ADL's and daily lovenox injections.  Talked to pharmacist about coumadin dose for tonight and pharmacist stated to instruct pt to take 5mg  coumadin tonight..the patient understood with no questions.  Pt stated already has rolling walker and 3 in 1 at home from previous surgery.  Pt transported to private  Vehicle with NT and cousin via wheelchair.

## 2012-09-14 NOTE — Progress Notes (Signed)
   Subjective: 2 Days Post-Op Procedure(s) (LRB): TOTAL HIP ARTHROPLASTY ANTERIOR APPROACH (Left)   Patient reports pain as mild, pain well controlled. No events throughout the night. The N&V from yesterday has resolved. Ready for discharge.  Objective:   VITALS:   Filed Vitals:   09/14/12  BP: 159/74  Pulse: 65  Temp: 98 F (36.7 C)   Resp: 18    Neurovascular intact Dorsiflexion/Plantar flexion intact Incision: dressing C/D/I No cellulitis present Compartment soft  LABS  Basename 09/14/12 0436 09/13/12 0450  HGB 10.0* 11.0*  HCT 29.2* 32.5*  WBC 14.3* 11.7*  PLT 150 140*     Basename 09/14/12 0436 09/13/12 0450  NA 136 134*  K 5.0 4.3  BUN 23 18  CREATININE 1.03 0.91  GLUCOSE 155* 213*     Assessment/Plan: 2 Days Post-Op Procedure(s) (LRB): TOTAL HIP ARTHROPLASTY ANTERIOR APPROACH (Left) Up with therapy Plan for discharge today Follow up in 2 weeks at [facility]. Follow up with OLIN,Altamese Deguire D in 2 weeks.  Contact information:  [facility] [address] [place] [address] [phone]    Expected ABLA  Treated with iron and will observe   Overweight (BMI 25-29.9)  Estimated Body mass index is 29.53 kg/(m^2) as calculated from the following:  Height as of this encounter: 6\' 2" (1.88 m).  Weight as of this encounter: 230 lb(104.327 kg).  Patient also counseled that weight may inhibit the healing process  Patient counseled that losing weight will help with future health issues   Anastasio Auerbach. Tavyn Kurka   PAC  09/14/2012, 9:25 AM

## 2012-09-14 NOTE — Discharge Summary (Signed)
Physician Discharge Summary  Patient ID: [name] MRN: [id number] DOB/AGE: 70/13/1942 70 y.o.  Admit date: 09/12/2012 Discharge date:  09/14/2012  Procedures:  Procedure(s) (LRB): TOTAL HIP ARTHROPLASTY ANTERIOR APPROACH (Left)  Attending Physician:  Dr. Durene Mitchell   Admission Diagnoses:   Left hip OA / pain  Discharge Diagnoses:  Principal Problem:  *S/P left THA, AA Active Problems:  Expected blood loss anemia  Overweight (BMI 25.0-29.9) Atrial fibrillation  GERD   Hyperlipidemia   Anemia   Hx of Cancer   HPI: Tyrone Mitchell, 71 y.o. male, has a history of pain and functional disability in the left hip(s) due to arthritis and patient has failed non-surgical conservative treatments for greater than 12 weeks to include corticosteriod injections and activity modification. Onset of symptoms was gradual starting 3 years ago with gradually worsening course since that time.The patient noted no past surgery on the left hip(s). Patient currently rates pain in the left hip at 7 out of 10 with activity. Patient has worsening of pain with activity and weight bearing, trendelenberg gait, pain that interfers with activities of daily living and pain with passive range of motion. Patient has evidence of periarticular osteophytes and joint space narrowing by imaging studies. This condition presents safety issues increasing the risk of falls. There is no current active infection. Risks, benefits and expectations were discussed with the patient. Patient understand the risks, benefits and expectations and wishes to proceed with surgery.  PCP: Tyrone Hector, MD   Discharged Condition: good  Hospital Course:  Patient underwent the above stated procedure on 09/12/2012. Patient tolerated the procedure well and brought to the recovery room in good condition and subsequently to the floor.  POD #1 BP: 109/72 ; Pulse: 72 ; Temp: 97.4 F (36.3 C) ; Resp: 18  Pt's foley was  removed, as well as the hemovac drain removed. IV was changed to a saline lock. Patient reports pain as mild, pain well controlled. C/o nausea and vomiting, but otherwise no events throughout the night. Neurovascular intact, dorsiflexion/plantar flexion intact, incision: dressing C/D/I, no cellulitis present and compartment soft.   LABS  Basename  09/13/12 0450   HGB  11.0  HCT  32.5   POD #2  BP: 159/74 ; Pulse: 65 ; Temp: 98 F (36.7 C) ; Resp: 18  Patient reports pain as mild, pain well controlled. No events throughout the night. The N&V from yesterday has resolved. Ready for discharge. Neurovascular intact, dorsiflexion/plantar flexion intact, incision: dressing C/D/I, no cellulitis present and compartment soft.   LABS  Basename  09/14/12 0436   HGB  10.0  HCT  29.2    Discharge Exam: General appearance: alert, cooperative and no distress Extremities: Homans sign is negative, no sign of DVT, no edema, redness or tenderness in the calves or thighs and no ulcers, gangrene or trophic changes  Disposition:   Home or Self Care with follow up in 2 weeks   Follow-up Information    Follow up with Tyrone Pal, MD. In 2 weeks.   Contact information:   [address] [place] [address] [phone]          Discharge Orders    Future Orders Please Complete By Expires   Diet - low sodium heart healthy      Call MD / Call 911      Comments:   If you experience chest pain or shortness of breath, CALL 911 and be transported to the hospital emergency room.  If you develope a fever above 101 F, pus (white drainage) or increased drainage or redness at the wound, or calf pain, call your surgeon's office.   Discharge instructions      Comments:   Maintain surgical dressing for 10-14 days, then replace with gauze and tape. Keep the area dry and clean until follow up. Follow up in 2 weeks at [facility]. Call with any questions or concerns.   Constipation  Prevention      Comments:   Drink plenty of fluids.  Prune juice may be helpful.  You may use a stool softener, such as Colace (over the counter) 100 mg twice a day.  Use MiraLax (over the counter) for constipation as needed.   Increase activity slowly as tolerated      Change dressing      Comments:   Maintain surgical dressing for 10-14 days, then replace with 4x4 guaze and tape. Keep the area dry and clean.   TED hose      Comments:   Use stockings (TED hose) for 2 weeks on both leg(s).  You may remove them at night for sleeping.      Current Discharge Medication List    START taking these medications   Details  diphenhydrAMINE (BENADRYL) 25 mg capsule Take 1 capsule (25 mg total) by mouth every 6 (six) hours as needed for itching, allergies or sleep. Qty: 30 capsule    docusate sodium 100 MG CAPS Take 100 mg by mouth 2 (two) times daily. Qty: 10 capsule    enoxaparin (LOVENOX) 40 MG/0.4ML injection Inject 0.4 mLs (40 mg total) into the skin daily. Qty: 12 Syringe, Refills: 0   Comments: Use along with Coumadin to obtain therapeutic INR. May stop Lovenox once INR >/= 1.8.    ferrous sulfate 325 (65 FE) MG tablet Take 1 tablet (325 mg total) by mouth 3 (three) times daily after meals.    HYDROcodone-acetaminophen (NORCO) 7.5-325 MG per tablet Take 1-2 tablets by mouth every 4 (four) hours as needed for pain. Qty: 120 tablet, Refills: 0    methocarbamol (ROBAXIN) 500 MG tablet Take 1 tablet (500 mg total) by mouth every 6 (six) hours as needed (muscle spasms). Qty: 50 tablet, Refills: 0    polyethylene glycol (MIRALAX / GLYCOLAX) packet Take 17 g by mouth 2 (two) times daily. Qty: 14 each      CONTINUE these medications which have CHANGED   Details  warfarin (COUMADIN) 5 MG tablet Take 1 tablet (5 mg total) by mouth as directed. Takes 1 tablet every day except Tuesday and Thursday he does not take any Qty: 45 tablet, Refills: 0   Comments: Use along with Lovenox to obtain  therapeutic INR. Daily INR by [facility] to monitor levels and dosing adjusted by pharmacy to obtain and maintain therapeutic INR of 2-3. Last dose in the hospital was 7.5 mg.  INR on 09/14/12 at  0436 was 1.54      CONTINUE these medications which have NOT CHANGED   Details  aspirin 81 MG tablet Take 81 mg by mouth daily.     flecainide (TAMBOCOR) 100 MG tablet Take 100 mg by mouth 2 (two) times daily.    omeprazole (PRILOSEC) 20 MG capsule Take 20 mg by mouth daily.       STOP taking these medications     acetaminophen (TYLENOL) 500 MG tablet Comments:  Reason for Stopping:           Signed: Anastasio Auerbach. Zelina Jimerson  PAC  09/14/2012, 9:40 AM

## 2012-09-14 NOTE — Progress Notes (Signed)
ANTICOAGULATION CONSULT NOTE   Pharmacy Consult for Coumadin Indication: H/o atrial fibrillation, VTE prophylaxis s/p L TKA   Allergies  Allergen Reactions  . Penicillins Other (See Comments)    Swelling of the mouth    Patient Measurements: Height: 6\' 2"  (188 cm) Weight: 230 lb (104.327 kg) IBW/kg (Calculated) : 82.2   Labs:  Basename 09/14/12 0436 09/13/12 0450 09/12/12 1320  HGB 10.0* 11.0* --  HCT 29.2* 32.5* --  PLT 150 140* --  APTT -- -- --  LABPROT 18.0* 13.9 13.2  INR 1.54* 1.08 1.01  HEPARINUNFRC -- -- --  CREATININE 1.03 0.91 --  CKTOTAL -- -- --  CKMB -- -- --  TROPONINI -- -- --    Assessment:  71 yo M on Coumadin PTA for h/o atrial fibrillation.  Patient was reportedly on Coumadin 5mg  po daily EXCEPT no Coumadin on Tues/Thur.  Coumadin was placed on hold for L TKA on 09/12/2012 and Coumadin resumed post-op.  MD also ordered Lovenox 40 mg sq daily until INR > 1.8 (began 12/4 AM)  INR responding  Plans for discharge today noted  Goal of Therapy:  INR 2-3 Monitor platelets by anticoagulation protocol: Yes   Recommend:   Coumadin 5 mg po daily at 6pm for now.  Continue Lovenox 40 mg SQ daily until INR response adequate.  INR tomorrow by [facility] agency, then at least twice per week until INR stable and therapeutic.  Adjust Coumadin dosage to maintain INR 2-3.  See above for information regarding patient's reported prior home Coumadin dosage requirements.  Elie Goody, PharmD, BCPS Pager: [phone] 09/14/2012  12:16 PM

## 2012-09-14 NOTE — Progress Notes (Signed)
CSW assisting with d/c planning to SNF. Pt has bed offers from [facility] and [facility]. Both facilities have openings Thurs / Fri. Pt will chose SNF this am . CSW will assist with d/c planning to SNF when stable.  Cori Razor LCSW [phone]

## 2012-09-14 NOTE — Care Management Note (Signed)
    Page 1 of 2   09/14/2012     1:23:36 PM   CARE MANAGEMENT NOTE 09/14/2012  Patient:  Tyrone Mitchell, Tyrone Mitchell   Account Number:  [id number]  Date Initiated:  09/13/2012  Documentation initiated by:  Colleen Can  Subjective/Objective Assessment:   DX OSTEOARTHRITIS LEFT HIP; TOTAL HIP REPLACEMNT -ANTERIOR APPROACH     Action/Plan:   SNF REHAB   Anticipated DC Date:  09/15/2012   Anticipated DC Plan:  SKILLED NURSING FACILITY  In-house referral  Clinical Social Worker      DC Planning Services  CM consult      [facility] Choice  NA   Choice offered to / List presented to:  NA   DME arranged  NA      DME agency  NA     HH arranged  HH-1 RN  HH-2 PT      HH agency  [facility]   Status of service:  Completed, signed off Medicare Important Message given?  NA - LOS <3 / Initial given by admissions (If response is "NO", the following Medicare IM given date fields will be blank) Date Medicare IM given:   Date Additional Medicare IM given:    Discharge Disposition:  HOME W HOME HEALTH SERVICES  Per UR Regulation:  Reviewed for med. necessity/level of care/duration of stay  If discussed at Long Length of Stay Meetings, dates discussed:    Comments:  09/14/2012 Colleen Can BSN RN CCM [phone] Pt has changed plans and wants to go home with home health services vs skilled facilti. Pt states he plans to return to his home in [place] where nephrew will be his caregiver. He wants to use [facility] for [facility] services. He has used them in the past. CM called [facility] who advises that they can service patient with [facility] for Coumadin managemnt and HHpt. Services will start tomorrow 12./03/2012. Pt already has DME.

## 2012-09-14 NOTE — Evaluation (Signed)
Occupational Therapy Evaluation Patient Details Name: Tyrone Mitchell MRN: [id number] DOB: 1940/11/13 Today's Date: 09/14/2012 Time: 2440-1027 OT Time Calculation (min): 34 min  OT Assessment / Plan / Recommendation Clinical Impression  Pt is a 71 y/o male s/p L THR, he lives alone. Recommend SNF vs 24hr S-Min A. Overall pt is Min A LB dressing/bathing, he requires vc's for safety and sequencing with functional mobility, transfers and ADL's. He will benefit from cont OT in the acute setting for  ADL retraining to assist with maximizing Independence    OT Assessment  Patient needs continued OT Services    Follow Up Recommendations  SNF    Barriers to Discharge Other (comment) (Lives alone)    Equipment Recommendations  None recommended by OT       Frequency  Min 2X/week    Precautions / Restrictions Precautions Precautions: None Restrictions LLE Weight Bearing: Weight bearing as tolerated   Pertinent Vitals/Pain Pt rated pain 2/10 LLE prior to OT assessment today. He was premedicated prior to session and reported 1/10 pain after up sitting in chair. RN is aware.    ADL  Eating/Feeding: Performed;Independent Where Assessed - Eating/Feeding: Chair Grooming: Performed;Wash/dry hands;Wash/dry face;Teeth care;Brushing hair;Set up;Supervision/safety Where Assessed - Grooming: Supported sitting Upper Body Bathing: Performed;Chest;Right arm;Left arm;Abdomen;Supervision/safety;Set up Where Assessed - Upper Body Bathing: Supported sitting;Unsupported sitting Lower Body Bathing: Performed;Minimal assistance Where Assessed - Lower Body Bathing: Supported sit to stand;Unsupported sit to stand Upper Body Dressing: Performed;Min guard;Other (comment) (secondary to IV/Lines) Where Assessed - Upper Body Dressing: Unsupported sitting;Supported sitting Lower Body Dressing: Performed;Minimal assistance Where Assessed - Lower Body Dressing: Supported sitting Toilet Transfer:  Performed;Supervision/safety;Min guard Statistician Method: Sit to Barista: Raised toilet seat with arms (or 3-in-1 over toilet);Grab bars Toileting - Clothing Manipulation and Hygiene: Performed;Min guard;Supervision/safety Where Assessed - Toileting Clothing Manipulation and Hygiene: Sit on 3-in-1 or toilet Tub/Shower Transfer Method: Not assessed Equipment Used: Gait belt;Rolling walker;Other (comment) (3:1 over toilet) Transfers/Ambulation Related to ADLs: Pt requires vc's for safety and sequencing during ADL's, transfers and self care tasks ADL Comments: Pt is s/p L THR, he lives alone. Recommend SNF vs 24hr S-Min A. Overall pt is Min A LB dressing/bathing, he requires vc's for safety and sequencing with functional mobility, transfers and ADL's. He will benefit from cont OT in the acute setting for  ADL retraining to assist with maximizing Independence     OT Diagnosis: Acute pain;Generalized weakness  OT Problem List: Decreased activity tolerance;Decreased safety awareness;Decreased knowledge of use of DME or AE;Pain OT Treatment Interventions: Self-care/ADL training;Therapeutic activities;DME and/or AE instruction;Energy conservation;Patient/family education   OT Goals Acute Rehab OT Goals OT Goal Formulation: With patient Time For Goal Achievement: 09/21/12 Potential to Achieve Goals: Good ADL Goals Pt Will Perform Lower Body Bathing: with set-up;Sitting, chair;Sitting, edge of bed;with adaptive equipment;with supervision ADL Goal: Lower Body Bathing - Progress: Goal set today Pt Will Perform Lower Body Dressing: with set-up;with supervision;Sit to stand from chair;Sit to stand from bed;with adaptive equipment ADL Goal: Lower Body Dressing - Progress: Goal set today Pt Will Transfer to Toilet: 3-in-1;with DME;Grab bars;with supervision ADL Goal: Toilet Transfer - Progress: Goal set today Pt Will Perform Toileting - Clothing Manipulation: with modified  independence;Sitting on 3-in-1 or toilet ADL Goal: Toileting - Clothing Manipulation - Progress: Goal set today Pt Will Perform Toileting - Hygiene: Independently;Sitting on 3-in-1 or toilet ADL Goal: Toileting - Hygiene - Progress: Goal set today  Visit Information  Last OT Received On: 09/14/12  Subjective Data  Subjective: "I'd like to go home, but I live alone" Patient Stated Goal: Return home when able    Prior Functioning     Home Living Lives With: Alone Available Help at Discharge: Other (Comment) (PRN assist from son (whom works as Emergency planning/management officer)) Type of Home: [employer] Access: Stairs to enter Secretary/administrator of Steps: 2 Entrance Stairs-Rails: None Home Layout: One level Bathroom Shower/Tub: Psychologist, counselling;Tub/shower unit Bathroom Toilet: Standard Home Adaptive Equipment: Bedside commode/3-in-1;Straight cane;Walker - rolling Prior Function Level of Independence: Independent Communication Communication: No difficulties Dominant Hand: Right    Vision/Perception  Wears glasses at all times, no change from baseline   Cognition  Overall Cognitive Status: Appears within functional limits for tasks assessed/performed Arousal/Alertness: Awake/alert Orientation Level: Appears intact for tasks assessed Behavior During Session: [facility] for tasks performed    Extremity/Trunk Assessment Right Upper Extremity Assessment RUE ROM/Strength/Tone: Within functional levels Left Upper Extremity Assessment LUE ROM/Strength/Tone: Within functional levels     Mobility Bed Mobility Bed Mobility: Supine to Sit Supine to Sit: 4: Min guard Details for Bed Mobility Assistance: VC's for technique & UE hand placement Transfers Transfers: Sit to Stand;Stand to Sit Sit to Stand: 4: Min guard;From bed;From chair/3-in-1;Without upper extremity assist;With armrests Stand to Sit: 4: Min guard;To chair/3-in-1;To bed;With armrests Details for Transfer Assistance: VC's for safety and  sequencing, hand placement as well                  End of Session OT - End of Session Equipment Utilized During Treatment: Gait belt;Other (comment) (RW) Activity Tolerance: Patient tolerated treatment well Patient left: in chair;with call bell/phone within reach;with nursing in room Nurse Communication: Other (comment) (ADL status)  GO   Pt seen for individual ADL session after completition of eval today. Discussed ADL's and self care precautions during functional mobility and transfers w/ pt and pt's RN in room. Pt required assistance & VC's for donning gown after bathing. He will benefit from 24hr S-Min A at SNF level secondary to lives alone.   Mariam Dollar Beth Dixon 09/14/2012, 9:25 AM

## 2012-09-15 NOTE — Progress Notes (Signed)
Pt declined ST SNF placement and returned home on 12/ 5 with [facility] and family support. RNCM assisted with d/c planning.  Cori Razor LCSW [phone]

## 2012-10-08 ENCOUNTER — Observation Stay (HOSPITAL_COMMUNITY)
Admission: EM | Admit: 2012-10-08 | Discharge: 2012-10-10 | Disposition: A | Discharge status: Home / Self Care | Payer: Medicare Other | Attending: Internal Medicine | Admitting: Internal Medicine

## 2012-10-08 ENCOUNTER — Encounter (HOSPITAL_COMMUNITY): Payer: Self-pay | Admitting: *Deleted

## 2012-10-08 ENCOUNTER — Emergency Department (HOSPITAL_COMMUNITY): Payer: Medicare Other

## 2012-10-08 DIAGNOSIS — E782 Mixed hyperlipidemia: Secondary | ICD-10-CM | POA: Insufficient documentation

## 2012-10-08 DIAGNOSIS — R0602 Shortness of breath: Secondary | ICD-10-CM | POA: Insufficient documentation

## 2012-10-08 DIAGNOSIS — I4892 Unspecified atrial flutter: Principal | ICD-10-CM | POA: Diagnosis present

## 2012-10-08 DIAGNOSIS — I482 Chronic atrial fibrillation, unspecified: Secondary | ICD-10-CM | POA: Diagnosis present

## 2012-10-08 DIAGNOSIS — I714 Abdominal aortic aneurysm, without rupture, unspecified: Secondary | ICD-10-CM | POA: Diagnosis present

## 2012-10-08 DIAGNOSIS — I4891 Unspecified atrial fibrillation: Secondary | ICD-10-CM | POA: Diagnosis present

## 2012-10-08 DIAGNOSIS — Z7901 Long term (current) use of anticoagulants: Secondary | ICD-10-CM

## 2012-10-08 DIAGNOSIS — I10 Essential (primary) hypertension: Secondary | ICD-10-CM | POA: Diagnosis present

## 2012-10-08 DIAGNOSIS — E785 Hyperlipidemia, unspecified: Secondary | ICD-10-CM | POA: Diagnosis present

## 2012-10-08 DIAGNOSIS — I499 Cardiac arrhythmia, unspecified: Secondary | ICD-10-CM | POA: Insufficient documentation

## 2012-10-08 DIAGNOSIS — Z79899 Other long term (current) drug therapy: Secondary | ICD-10-CM | POA: Insufficient documentation

## 2012-10-08 DIAGNOSIS — I5031 Acute diastolic (congestive) heart failure: Secondary | ICD-10-CM | POA: Insufficient documentation

## 2012-10-08 DIAGNOSIS — I509 Heart failure, unspecified: Secondary | ICD-10-CM | POA: Insufficient documentation

## 2012-10-08 HISTORY — DX: Unspecified atrial flutter: I48.92

## 2012-10-08 HISTORY — DX: Rheumatic disorders of both mitral and aortic valves: I08.0

## 2012-10-08 LAB — CBC WITH DIFFERENTIAL/PLATELET
Basophils Absolute: 0 10*3/uL (ref 0.0–0.1)
Basophils Relative: 0 % (ref 0–1)
Eosinophils Absolute: 0.3 10*3/uL (ref 0.0–0.7)
Hemoglobin: 12.5 g/dL — ABNORMAL LOW (ref 13.0–17.0)
MCH: 31.3 pg (ref 26.0–34.0)
MCHC: 33.4 g/dL (ref 30.0–36.0)
Neutro Abs: 4 10*3/uL (ref 1.7–7.7)
Neutrophils Relative %: 66 % (ref 43–77)
Platelets: 301 10*3/uL (ref 150–400)
RDW: 13.7 % (ref 11.5–15.5)

## 2012-10-08 LAB — BASIC METABOLIC PANEL
BUN: 15 mg/dL (ref 6–23)
Chloride: 101 mEq/L (ref 96–112)
GFR calc Af Amer: >90 mL/min (ref >90)
GFR calc non Af Amer: 82 mL/min — ABNORMAL LOW (ref >90)
Potassium: 3.8 mEq/L (ref 3.5–5.1)
Sodium: 137 mEq/L (ref 135–145)

## 2012-10-08 LAB — PROTIME-INR: INR: 2.14 — ABNORMAL HIGH (ref 0.00–1.49)

## 2012-10-08 LAB — TROPONIN I: Troponin I: <0.3 ng/mL (ref <0.30)

## 2012-10-08 LAB — APTT: aPTT: 37 seconds (ref 24–37)

## 2012-10-08 MED ORDER — SODIUM CHLORIDE 0.9 % IJ SOLN
3.0000 mL | Freq: Two times a day (BID) | INTRAMUSCULAR | Status: DC
  Administered 2012-10-08 – 2012-10-09 (×2): 3 mL via INTRAVENOUS

## 2012-10-08 MED ORDER — DOCUSATE SODIUM 100 MG PO CAPS
100.0000 mg | ORAL_CAPSULE | Freq: Two times a day (BID) | ORAL | Status: DC
  Administered 2012-10-10: 100 mg via ORAL
  Filled 2012-10-08 (×5): qty 1

## 2012-10-08 MED ORDER — SODIUM CHLORIDE 0.9 % IV SOLN
INTRAVENOUS | Status: DC
  Administered 2012-10-08: 09:00:00 via INTRAVENOUS

## 2012-10-08 MED ORDER — METOPROLOL TARTRATE 25 MG PO TABS
25.0000 mg | ORAL_TABLET | Freq: Once | ORAL | Status: AC
  Administered 2012-10-08: 25 mg via ORAL
  Filled 2012-10-08: qty 1

## 2012-10-08 MED ORDER — SODIUM CHLORIDE 0.9 % IV SOLN
INTRAVENOUS | Status: DC
  Administered 2012-10-09: 500 mL via INTRAVENOUS

## 2012-10-08 MED ORDER — WARFARIN SODIUM 5 MG PO TABS: 5.0000 mg | ORAL_TABLET | Freq: Every day | ORAL | Status: DC

## 2012-10-08 MED ORDER — FLUTICASONE PROPIONATE 50 MCG/ACT NA SUSP: 2.0000 | Freq: Every day | NASAL | Status: DC | PRN

## 2012-10-08 MED ORDER — ZOLPIDEM TARTRATE 5 MG PO TABS: 5.0000 mg | ORAL_TABLET | Freq: Every evening | ORAL | Status: DC | PRN

## 2012-10-08 MED ORDER — IOHEXOL 350 MG/ML SOLN
100.0000 mL | Freq: Once | INTRAVENOUS | Status: AC | PRN
  Administered 2012-10-08: 100 mL via INTRAVENOUS

## 2012-10-08 MED ORDER — ACETAMINOPHEN 500 MG PO TABS
1000.0000 mg | ORAL_TABLET | Freq: Four times a day (QID) | ORAL | Status: DC | PRN
  Administered 2012-10-09: 1000 mg via ORAL
  Filled 2012-10-08: qty 2

## 2012-10-08 MED ORDER — PANTOPRAZOLE SODIUM 40 MG PO TBEC
40.0000 mg | DELAYED_RELEASE_TABLET | Freq: Every day | ORAL | Status: DC
  Administered 2012-10-09 – 2012-10-10 (×2): 40 mg via ORAL
  Filled 2012-10-08 (×2): qty 1

## 2012-10-08 MED ORDER — FLECAINIDE ACETATE 100 MG PO TABS
100.0000 mg | ORAL_TABLET | Freq: Two times a day (BID) | ORAL | Status: DC
  Administered 2012-10-08 – 2012-10-10 (×4): 100 mg via ORAL
  Filled 2012-10-08 (×5): qty 1

## 2012-10-08 MED ORDER — WARFARIN SODIUM 5 MG PO TABS
5.0000 mg | ORAL_TABLET | ORAL | Status: AC
  Administered 2012-10-08: 5 mg via ORAL
  Filled 2012-10-08: qty 1

## 2012-10-08 MED ORDER — WARFARIN - PHYSICIAN DOSING INPATIENT: Freq: Every day | Status: DC

## 2012-10-08 MED ORDER — ASPIRIN EC 81 MG PO TBEC
81.0000 mg | DELAYED_RELEASE_TABLET | Freq: Every morning | ORAL | Status: DC
  Administered 2012-10-09: 81 mg via ORAL
  Filled 2012-10-08: qty 1

## 2012-10-08 NOTE — ED Notes (Signed)
Patient transported to X-ray 

## 2012-10-08 NOTE — ED Notes (Signed)
Pt ambulated while monitoring HR and pulse ox, 99-100% on RA while walking and HR 120-140's, when pt got back to room and sat down stated he felt "winded", HR went back down to 100's.

## 2012-10-08 NOTE — ED Provider Notes (Signed)
History     CSN: [id number]  Arrival date & time 10/08/12  5784   First MD Initiated Contact with Patient 10/08/12 [phone]      Chief Complaint  Patient presents with  . Irregular Heart Beat    (Consider location/radiation/quality/duration/timing/severity/associated sxs/prior treatment) HPI Pt with history of atrial fibrillation, reports he had total hip arthroplasty about 3 weeks ago. He has been back on his coumadin since then with a lovenox bridge. He reports he was therapeutic on INR last week, but then was subtherapeutic on most recent check. He was doing well until yesterday when he got up and felt generally weak. He was SOB with walking short distances and noted that his heart rate was elevated to about 115 on home monitor. He denies any CP, no cough or fever. Has not had vomiting or diarrhea, has had good PO intake and appetite.   Past Medical History  Diagnosis Date  . Atrial fibrillation   . GERD (gastroesophageal reflux disease)   . Hyperlipidemia   . Anemia   . Cancer     Past Surgical History  Procedure Date  . Back surgery   . Hip surgery   . Cholecystectomy   . Prostatectomy   . Hernia repair   . Total hip arthroplasty 09/12/2012    Procedure: TOTAL HIP ARTHROPLASTY ANTERIOR APPROACH;  Surgeon: Shelda Pal, MD;  Location: [facility];  Service: Orthopedics;  Laterality: Left;    No family history on file.  History  Substance Use Topics  . Smoking status: Former Smoker    Types: Cigars    Quit date: 08/27/2011  . Smokeless tobacco: Not on file  . Alcohol Use: No      Review of Systems All other systems reviewed and are negative except as noted in HPI.   Allergies  Penicillins  Home Medications   Current Outpatient Rx  Name  Route  Sig  Dispense  Refill  . ASPIRIN 81 MG PO TABS   Oral   Take 81 mg by mouth daily.          Marland Kitchen DIPHENHYDRAMINE HCL 25 MG PO CAPS   Oral   Take 1 capsule (25 mg total) by mouth every 6 (six) hours as needed for  itching, allergies or sleep.   30 capsule      . DSS 100 MG PO CAPS   Oral   Take 100 mg by mouth 2 (two) times daily.   10 capsule      . ENOXAPARIN SODIUM 40 MG/0.4ML Ashley SOLN   Subcutaneous   Inject 0.4 mLs (40 mg total) into the skin daily.   12 Syringe   0     Use along with Coumadin to obtain therapeutic INR. ...   . FERROUS SULFATE 325 (65 FE) MG PO TABS   Oral   Take 1 tablet (325 mg total) by mouth 3 (three) times daily after meals.         Marland Kitchen FLECAINIDE ACETATE 100 MG PO TABS   Oral   Take 100 mg by mouth 2 (two) times daily.         Marland Kitchen HYDROCODONE-ACETAMINOPHEN 7.5-325 MG PO TABS   Oral   Take 1-2 tablets by mouth every 4 (four) hours as needed for pain.   120 tablet   0   . METHOCARBAMOL 500 MG PO TABS   Oral   Take 1 tablet (500 mg total) by mouth every 6 (six) hours as needed (muscle spasms).  50 tablet   0   . OMEPRAZOLE 20 MG PO CPDR   Oral   Take 20 mg by mouth daily.          Marland Kitchen POLYETHYLENE GLYCOL 3350 PO PACK   Oral   Take 17 g by mouth 2 (two) times daily.   14 each      . WARFARIN SODIUM 5 MG PO TABS   Oral   Take 1 tablet (5 mg total) by mouth as directed. Takes 1 tablet every day except Tuesday and Thursday he does not take any   45 tablet   0     Use along with Lovenox to obtain therapeutic INR.  ...     BP 135/83  Pulse 110  Temp 98 F (36.7 C) (Oral)  Resp 18  SpO2 98%  Physical Exam  Nursing note and vitals reviewed. Constitutional: He is oriented to person, place, and time. He appears well-developed and well-nourished.  HENT:  Head: Normocephalic and atraumatic.  Eyes: EOM are normal. Pupils are equal, round, and reactive to light.  Neck: Normal range of motion. Neck supple.  Cardiovascular: Regular rhythm, normal heart sounds and intact distal pulses.        Fast heart rate  Pulmonary/Chest: Effort normal and breath sounds normal.  Abdominal: Bowel sounds are normal. He exhibits no distension. There is no  tenderness.  Musculoskeletal: Normal range of motion. He exhibits no edema and no tenderness.  Neurological: He is alert and oriented to person, place, and time. He has normal strength. No cranial nerve deficit or sensory deficit.  Skin: Skin is warm and dry. No rash noted.  Psychiatric: He has a normal mood and affect.    ED Course  Procedures (including critical care time)  Labs Reviewed  CBC WITH DIFFERENTIAL - Abnormal; Notable for the following:    RBC 3.99 (*)     Hemoglobin 12.5 (*)     HCT 37.4 (*)     All other components within normal limits  BASIC METABOLIC PANEL - Abnormal; Notable for the following:    Glucose, Bld 131 (*)     GFR calc non Af Amer 82 (*)     All other components within normal limits  PROTIME-INR - Abnormal; Notable for the following:    Prothrombin Time 23.0 (*)     INR 2.14 (*)     All other components within normal limits  TROPONIN I  APTT   Dg Chest 2 View  10/08/2012  *RADIOLOGY REPORT*  Clinical Data: Shortness of breath and tachycardia  CHEST - 2 VIEW  Comparison: 09/06/2012  Findings: The heart size and mediastinal contours are within normal limits.  Both lungs are clear.  The visualized skeletal structures are unremarkable.  IMPRESSION: Negative exam.   Original Report Authenticated By: Signa Kell, M.D.    Ct Angio Chest Pe W/cm &/or Wo Cm  10/08/2012  *RADIOLOGY REPORT*  Clinical Data: Shortness of breath.  Tachycardia.  Atrial fibrillation.  CT ANGIOGRAPHY CHEST  Technique:  Multidetector CT imaging of the chest using the standard protocol during bolus administration of intravenous contrast. Multiplanar reconstructed images including MIPs were obtained and reviewed to evaluate the vascular anatomy.  Contrast: OMNIPAQUE IOHEXOL 350 MG/ML SOLN  Comparison: Chest x-ray dated 10/08/2012.  Chest CT dated 08/16/2011  Findings: There are no pulmonary emboli, infiltrates, or effusions. There is slight scarring in the lung apices, stable.   Chronic bronchitic changes at the bases, right greater than left.  No  acute osseous abnormality.  Heart size is normal.  Visualized portion of the upper abdomen is normal.  IMPRESSION: No acute abnormalities.  Specifically, no pulmonary emboli.   Original Report Authenticated By: Francene Boyers, M.D.      No diagnosis found.    MDM   Date: 10/08/2012  Rate: 112  Rhythm: sinus tachycardia  QRS Axis: normal  Intervals: normal  ST/T Wave abnormalities: normal  Conduction Disutrbances:none  Narrative Interpretation:   Old EKG Reviewed: unchanged  Pt in NSR on EKG, while doing my initial eval, there was a brief episode of afib (<10sec) on monitor.    11:24 AM Labs and imaging are unremarkable. Pt feels well when lying down, but on ambulating HR increases to 120s and he becomes dyspneic again. No hypoxia. Discussed case with Dr. Verdis Prime on call for Cardiology. He recommends oral metoprolol and he will come evaluate the patient. NSR on EKG may actually be atrial flutter with slower than typical flutter pattern due to flecainide.   1:23 PM Pt seen by Dr. Katrinka Blazing who recommends admission for cardioversion.     Anne-Marie Genson B. Bernette Mayers, MD 10/08/12 1324

## 2012-10-08 NOTE — ED Notes (Signed)
Pt states yesterday morning woke up short of breath and felt his heart rate was up, when he put himself on the monitor he saw his heart rate was 118 and in afib, states lowest it was 81, pt states today it hasn't subsided so decided to come to hospital, pt has hx of afib, is on coumadin, recently had L hip surgery, denies pain, denies chest pain, denies n/v/d. Pt 98% on RA, not short of breath laying in bed.

## 2012-10-08 NOTE — ED Notes (Signed)
[facility] at bedside, pt transported to [facility], in no distress or pain when discharged.

## 2012-10-08 NOTE — ED Notes (Signed)
Patient transported to CT 

## 2012-10-08 NOTE — H&P (Signed)
Tyrone Mitchell is a 71 y.o. male  Admit date: 10/08/2012 Referring Physician: [facility] Primary Cardiologist: Gilman Schmidt, M.D. Chief complaint / reason for admission: Atrial flutter  HPI: The patient is 71 and has a history of atrial fibrillation on chronic flecainide therapy managed by Dr. Gilman Schmidt. Starting approximately 36 hours ago he felt fluttering and began noticing dyspnea on exertion. He came to the emergency room this morning at [facility]. EKG demonstrated atrial flutter with mild tachycardia in the 110 be permitted range. The electrocardiogram was initially interpreted by the emergency room physician as sinus tachycardia. They did a CT angiogram of the lungs to rule out pulmonary embolism. They called concerning the tachycardia and concern for a hidden arrhythmia was raised. When the EKG was reviewed, atrial flutter was a clear rhythm. The patient has never had cardioversion in the past. He is suffering with dyspnea on exertion and lives alone. He recently had a left hip replacement    PMH:    Past Medical History  Diagnosis Date  . Atrial fibrillation   . GERD (gastroesophageal reflux disease)   . Hyperlipidemia   . Anemia   . Cancer     PSH:    Past Surgical History  Procedure Date  . Back surgery   . Hip surgery   . Cholecystectomy   . Prostatectomy   . Hernia repair   . Total hip arthroplasty 09/12/2012    Procedure: TOTAL HIP ARTHROPLASTY ANTERIOR APPROACH;  Surgeon: Shelda Pal, MD;  Location: [facility];  Service: Orthopedics;  Laterality: Left;    ALLERGIES:   Penicillins  Prior to Admit Meds:   (Not in a hospital admission) Family HX:   No family history on file. Social HX:    History   Social History  . Marital Status: Divorced    Spouse Name: N/A    Number of Children: N/A  . Years of Education: N/A   Occupational History  . Not on file.   Social History Main Topics  . Smoking status: Former  Smoker    Types: Cigars    Quit date: 08/27/2011  . Smokeless tobacco: Not on file  . Alcohol Use: No  . Drug Use: No  . Sexually Active: Not on file   Other Topics Concern  . Not on file   Social History Narrative  . No narrative on file     ROS: He specifically denies orthopnea, PND, palpitations, syncope, chest discomfort, transient neurological complaints, bleeding, and missed doses of Coumadin. He does state that his last INR was 1.78 Thursday, December 26th.  Physical Exam: Blood pressure 130/91, pulse 102, temperature 98 F (36.7 C), temperature source Oral, resp. rate 17, SpO2 98.00%.   The patient appears somewhat pale. He is lying comfortably on the gurney in the emergency room without distress.  HEENT exam is unremarkable. No Johns is noted.  Lungs are clear anteriorly and posteriorly.  The cardiac exam reveals a mildly tachycardic rhythm. No obvious gallop, rub, or murmur.  Abdomen is soft. Bowel sounds are normal. No tenderness is noted.  Extremities reveal no edema.  The neurological exam is unremarkable. No motor sensory deficits and noted to Labs:   Lab Results  Component Value Date   WBC 6.0 10/08/2012   HGB 12.5* 10/08/2012   HCT 37.4* 10/08/2012   MCV 93.7 10/08/2012   PLT 301 10/08/2012    Lab 10/08/12 0854  NA 137  K 3.8  CL  101  CO2 24  BUN 15  CREATININE 0.96  CALCIUM 9.1  PROT --  BILITOT --  ALKPHOS --  ALT --  AST --  GLUCOSE 131*   Lab Results  Component Value Date   CKTOTAL 324* 08/16/2011   CKMB 5.7* 08/16/2011   TROPONINI <0.30 10/08/2012   INR/Prothrombin Time: 2.14    Radiology:   CHEST - 2 VIEW  Comparison: 09/06/2012  Findings: The heart size and mediastinal contours are within normal  limits. Both lungs are clear. The visualized skeletal structures  are unremarkable.  IMPRESSION:  Negative exam.  Original Report Authenticated By: Signa Kell, M.D.  CT ANGIOGRAPHY CHEST  Technique: Multidetector CT imaging  of the chest using the  standard protocol during bolus administration of intravenous  contrast. Multiplanar reconstructed images including MIPs were  obtained and reviewed to evaluate the vascular anatomy.  Contrast: OMNIPAQUE IOHEXOL 350 MG/ML SOLN  Comparison: Chest x-ray dated 10/08/2012. Chest CT dated  08/16/2011  Findings: There are no pulmonary emboli, infiltrates, or effusions.  There is slight scarring in the lung apices, stable. Chronic  bronchitic changes at the bases, right greater than left.  No acute osseous abnormality. Heart size is normal. Visualized  portion of the upper abdomen is normal.  IMPRESSION:  No acute abnormalities. Specifically, no pulmonary emboli.  Original Report Authenticated By: Francene Boyers, M.D.    EKG:  Atrial flutter with verbal block and a ventricular response of around 108 beats per  ASSESSMENT:  1. ATRIAL flutter with poor rate control  2. Acute diastolic heart failure  3. Chronic oral anticoagulation therapy, therapeutic INR today at 2.14 but was 1.79 on 10/05/2012.  Plan:  1. Will admit to the hospital for TE cardioversion within the next 24 hours  2. Continue oral Coumadin therapy  3. Consider diuresis.   Lesleigh Noe 10/08/2012 12:43 PM

## 2012-10-09 ENCOUNTER — Observation Stay (HOSPITAL_COMMUNITY): Payer: Medicare Other | Admitting: Anesthesiology

## 2012-10-09 ENCOUNTER — Encounter (HOSPITAL_COMMUNITY)
Admission: EM | Disposition: A | Discharge status: Home / Self Care | Payer: Self-pay | Source: Home / Self Care | Attending: Emergency Medicine

## 2012-10-09 ENCOUNTER — Encounter (HOSPITAL_COMMUNITY): Payer: Self-pay | Admitting: Anesthesiology

## 2012-10-09 ENCOUNTER — Encounter (HOSPITAL_COMMUNITY): Payer: Self-pay | Admitting: Gastroenterology

## 2012-10-09 DIAGNOSIS — I4892 Unspecified atrial flutter: Secondary | ICD-10-CM

## 2012-10-09 HISTORY — PX: TEE WITHOUT CARDIOVERSION: SHX5443

## 2012-10-09 HISTORY — PX: CARDIOVERSION: SHX1299

## 2012-10-09 LAB — CBC
MCH: 31.2 pg (ref 26.0–34.0)
MCHC: 33.1 g/dL (ref 30.0–36.0)
Platelets: 286 10*3/uL (ref 150–400)
RBC: 4.07 MIL/uL — ABNORMAL LOW (ref 4.22–5.81)

## 2012-10-09 LAB — BASIC METABOLIC PANEL
BUN: 13 mg/dL (ref 6–23)
Chloride: 104 mEq/L (ref 96–112)
Creatinine, Ser: 1.06 mg/dL (ref 0.50–1.35)
GFR calc Af Amer: 80 mL/min — ABNORMAL LOW (ref >90)
GFR calc non Af Amer: 69 mL/min — ABNORMAL LOW (ref >90)
Potassium: 4.1 mEq/L (ref 3.5–5.1)

## 2012-10-09 LAB — PROTIME-INR
INR: 2.15 — ABNORMAL HIGH (ref 0.00–1.49)
Prothrombin Time: 23.1 seconds — ABNORMAL HIGH (ref 11.6–15.2)

## 2012-10-09 LAB — MAGNESIUM: Magnesium: 2 mg/dL (ref 1.5–2.5)

## 2012-10-09 SURGERY — ECHOCARDIOGRAM, TRANSESOPHAGEAL
Anesthesia: General

## 2012-10-09 MED ORDER — FENTANYL CITRATE 0.05 MG/ML IJ SOLN
INTRAMUSCULAR | Status: DC | PRN
  Administered 2012-10-09 (×2): 25 ug via INTRAVENOUS

## 2012-10-09 MED ORDER — PROPOFOL 10 MG/ML IV BOLUS
INTRAVENOUS | Status: DC | PRN
  Administered 2012-10-09: 45 mg via INTRAVENOUS

## 2012-10-09 MED ORDER — ONDANSETRON HCL 4 MG/2ML IJ SOLN: 4.0000 mg | Freq: Once | INTRAMUSCULAR | Status: DC | PRN

## 2012-10-09 MED ORDER — MIDAZOLAM HCL 10 MG/2ML IJ SOLN
INTRAMUSCULAR | Status: DC | PRN
  Administered 2012-10-09 (×2): 2 mg via INTRAVENOUS

## 2012-10-09 MED ORDER — HYDROMORPHONE HCL PF 1 MG/ML IJ SOLN: 0.2500 mg | INTRAMUSCULAR | Status: DC | PRN

## 2012-10-09 MED ORDER — FENTANYL CITRATE 0.05 MG/ML IJ SOLN
INTRAMUSCULAR | Status: AC
  Filled 2012-10-09: qty 2

## 2012-10-09 MED ORDER — BUTAMBEN-TETRACAINE-BENZOCAINE 2-2-14 % EX AERO
INHALATION_SPRAY | CUTANEOUS | Status: DC | PRN
  Administered 2012-10-09: 2 via TOPICAL

## 2012-10-09 MED ORDER — LIDOCAINE HCL (CARDIAC) 20 MG/ML IV SOLN
INTRAVENOUS | Status: DC | PRN
  Administered 2012-10-09: 30 mg via INTRAVENOUS

## 2012-10-09 MED ORDER — WARFARIN SODIUM 5 MG PO TABS
5.0000 mg | ORAL_TABLET | ORAL | Status: DC
  Administered 2012-10-09: 5 mg via ORAL
  Filled 2012-10-09: qty 1

## 2012-10-09 MED ORDER — MIDAZOLAM HCL 5 MG/ML IJ SOLN
INTRAMUSCULAR | Status: AC
  Filled 2012-10-09: qty 2

## 2012-10-09 NOTE — Progress Notes (Signed)
Utilization review completed.  

## 2012-10-09 NOTE — Anesthesia Postprocedure Evaluation (Signed)
  Anesthesia Post-op Note  Patient: Tyrone Mitchell  Procedure(s) Performed: Procedure(s) (LRB) with comments: TRANSESOPHAGEAL ECHOCARDIOGRAM (TEE) (N/A) CARDIOVERSION (N/A)  Patient Location: PACU  Anesthesia Type:General  Level of Consciousness: sedated  Airway and Oxygen Therapy: Patient Spontanous Breathing and Patient connected to nasal cannula oxygen  Post-op Pain: none  Post-op Assessment: Post-op Vital signs reviewed, Patient's Cardiovascular Status Stable, Respiratory Function Stable, Patent Airway, No signs of Nausea or vomiting and Pain level controlled  Post-op Vital Signs: stable  Complications: No apparent anesthesia complications

## 2012-10-09 NOTE — Interval H&P Note (Signed)
History and Physical Interval Note:  10/09/2012 1:49 PM  Tyrone Mitchell  has presented today for surgery, with the diagnosis of a fib  The various methods of treatment have been discussed with the patient and family. After consideration of risks, benefits and other options for treatment, the patient has consented to  Procedure(s) (LRB) with comments: TRANSESOPHAGEAL ECHOCARDIOGRAM (TEE) (N/A) CARDIOVERSION (N/A) as a surgical intervention .  The patient's history has been reviewed, patient examined, no change in status, stable for surgery.  I have reviewed the patient's chart and labs.  Questions were answered to the patient's satisfaction.     Olga Millers

## 2012-10-09 NOTE — Progress Notes (Signed)
Patient Name: Tyrone Mitchell Date of Encounter: 10/09/2012     Principal Problem:  *Atrial flutter with rapid ventricular response Active Problems:  HYPERLIPIDEMIA-MIXED  HYPERTENSION, BENIGN  Warfarin anticoagulation  AAA (abdominal aortic aneurysm)  Acute diastolic heart failure    SUBJECTIVE: Continued palpitations and DOE. No chest pain or lightheadedness. No PND, orthopnea or LE edema.    OBJECTIVE  Filed Vitals:   10/08/12 1515 10/08/12 1656 10/08/12 2100 10/09/12 0600  BP:  145/93 142/102 121/95  Pulse: 99 104 111 97  Temp:  97.9 F (36.6 C) 97.7 F (36.5 C) 98.7 F (37.1 C)  TempSrc:  Oral    Resp: 16 18 16 14   Height:  6\' 2"  (1.88 m)    Weight:  99.8 kg (220 lb 0.3 oz)  99 kg (218 lb 4.1 oz)  SpO2: 99% 100% 98% 99%    Intake/Output Summary (Last 24 hours) at 10/09/12 1001 Last data filed at 10/09/12 0900  Gross per 24 hour  Intake      0 ml  Output      0 ml  Net      0 ml   Weight change:   PHYSICAL EXAM  General: Well developed, well nourished, in no acute distress. Head: Normocephalic, atraumatic, sclera non-icteric, no xanthomas, nares are without discharge.  Neck: Supple without bruits or JVD. Lungs:  Resp regular and unlabored, CTAB without wheezes, rales or rhonchi Heart: Regularly irregular, tachycardic, no s3, s4, or murmurs. Abdomen: Soft, non-tender, non-distended, BS + x 4.  Msk:  Strength and tone appears normal for age. Extremities: No clubbing, cyanosis or edema. DP/PT/Radials 2+ and equal bilaterally. Neuro: Alert and oriented X 3. Moves all extremities spontaneously. Psych: Normal affect.  LABS:  Recent Labs  [facility] 10/08/12 0854   WBC 6.0   HGB 12.5*   HCT 37.4*   MCV 93.7   PLT 301    Lab 10/08/12 0854  NA 137  K 3.8  CL 101  CO2 24  BUN 15  CREATININE 0.96  CALCIUM 9.1  PROT --  BILITOT --  ALKPHOS --  ALT --  AST --  AMYLASE --  LIPASE --  GLUCOSE 131*   Recent Labs  Basename 10/08/12 0854   CKTOTAL --   CKMB --   CKMBINDEX --   TROPONINI <0.30    Basename 10/08/12 1751  TSH 0.558  T4TOTAL --  T3FREE --  THYROIDAB --    TELE: atrial flutter vs course atrial fib, 120-150s  ECG: atrial flutter vs course atrial fibrillation, 108 bpm, no ST/T changes   Radiology/Studies:  Dg Chest 2 View  10/08/2012  *RADIOLOGY REPORT*  Clinical Data: Shortness of breath and tachycardia  CHEST - 2 VIEW  Comparison: 09/06/2012  Findings: The heart size and mediastinal contours are within normal limits.  Both lungs are clear.  The visualized skeletal structures are unremarkable.  IMPRESSION: Negative exam.   Original Report Authenticated By: Signa Kell, M.D.    Ct Angio Chest Pe W/cm &/or Wo Cm  10/08/2012  *RADIOLOGY REPORT*  Clinical Data: Shortness of breath.  Tachycardia.  Atrial fibrillation.  CT ANGIOGRAPHY CHEST  Technique:  Multidetector CT imaging of the chest using the standard protocol during bolus administration of intravenous contrast. Multiplanar reconstructed images including MIPs were obtained and reviewed to evaluate the vascular anatomy.  Contrast: OMNIPAQUE IOHEXOL 350 MG/ML SOLN  Comparison: Chest x-ray dated 10/08/2012.  Chest CT dated 08/16/2011  Findings: There are no pulmonary emboli, infiltrates,  or effusions. There is slight scarring in the lung apices, stable.  Chronic bronchitic changes at the bases, right greater than left.  No acute osseous abnormality.  Heart size is normal.  Visualized portion of the upper abdomen is normal.  IMPRESSION: No acute abnormalities.  Specifically, no pulmonary emboli.   Original Report Authenticated By: Francene Boyers, M.D.    Dg Pelvis Portable  09/12/2012  *RADIOLOGY REPORT*  Clinical Data: Status post left hip replacement.  PORTABLE PELVIS  Comparison: Plain film of the pelvis 08/16/2011.  Findings: The patient has a new left total hip replacement.  The device is located and no fracture is identified.  Gas in the soft tissues  and surgical drain are noted.  Old right hip replacement again noted.  IMPRESSION: New left total hip replacement without evidence of complication.   Original Report Authenticated By: Holley Dexter, M.D.    Dg Hip Portable 1 View Left  09/12/2012  *RADIOLOGY REPORT*  Clinical Data: Status post left hip replacement.  PORTABLE LEFT HIP - 1 VIEW  Comparison: Plain film the pelvis 08/16/2011.  Findings: New left total hip replacement is in place.  The device is located.  No fracture is identified.  Surgical drain noted.  IMPRESSION: Left total hip replacement without evidence of complication.   Original Report Authenticated By: Holley Dexter, M.D.    Dg C-arm 1-60 Min-no Report  09/12/2012  CLINICAL DATA: surgery   C-ARM 1-60 MINUTES  Fluoroscopy was utilized by the requesting physician.  No radiographic  interpretation.      Current Medications:     . aspirin EC  81 mg Oral q morning - 10a  . docusate sodium  100 mg Oral BID  . flecainide  100 mg Oral BID  . pantoprazole  40 mg Oral Daily  . sodium chloride  3 mL Intravenous Q12H  . warfarin  5 mg Oral Custom  . Warfarin - Physician Dosing Inpatient   Does not apply q1800    ASSESSMENT AND PLAN:  1. Atrial flutter- continues to c/o palpitations and DOE. Telemetry review indicates atrial flutter. Rates overnight range from 110s - 150s. EKG this AM does reveal flutter-type waves, in some ways does resemble course atrial fibrillation, especially given rate of 108 bpm. Have asked MD to review prior to TEE/DCCV today. This has tentatively been scheduled for 1330. Patient currently NPO, and vital signs/hemodynamics are otherwise stable. INR 2.14 today. Coumadin had been held for hip surgery earlier this month, and INRs were accordingly subtherapeutic. Continue flecainide.  2. Acute diastolic CHF- secondary to #1. pBNP mildly elevated. CXR clear. Notes persistent DOE. Euvolemic on exam - lungs fairly clear, no JVD or peripheral edema. Would hold  on diuresis for now. Suspect DOE with improve once converts to SR.   3. Hyperlipidemia - not on statin. Check lipids.   4. GERD- continue PPI.   5. AAA- 3.3 x 3.2 cm. Mild increase on 10/2011 repeat u/s. Currently stable.   6. Hypertension- BP WNL. Continue to monitor.    Signed, R. Hurman Horn, PA-C 10/09/2012, 10:01 AM As above; patient seen and examined; recurrent atrial flutter and history of atrial fibrillation; plan TEE DCCV today. Continue coumadin and flecanide; if recurrent atrial flutter in the future, could consider ablation. DC asa given need for coumadin. Olga Millers 10:24 AM

## 2012-10-09 NOTE — H&P (View-Only) (Signed)
 Patient Name: Tyrone Mitchell Date of Encounter: 10/09/2012     Principal Problem:  *Atrial flutter with rapid ventricular response Active Problems:  HYPERLIPIDEMIA-MIXED  HYPERTENSION, BENIGN  Warfarin anticoagulation  AAA (abdominal aortic aneurysm)  Acute diastolic heart failure    SUBJECTIVE: Continued palpitations and DOE. No chest pain or lightheadedness. No PND, orthopnea or LE edema.    OBJECTIVE  Filed Vitals:   10/08/12 1515 10/08/12 1656 10/08/12 2100 10/09/12 0600  BP:  145/93 142/102 121/95  Pulse: 99 104 111 97  Temp:  97.9 F (36.6 C) 97.7 F (36.5 C) 98.7 F (37.1 C)  TempSrc:  Oral    Resp: 16 18 16 14  Height:  6' 2" (1.88 m)    Weight:  99.8 kg (220 lb 0.3 oz)  99 kg (218 lb 4.1 oz)  SpO2: 99% 100% 98% 99%    Intake/Output Summary (Last 24 hours) at 10/09/12 1001 Last data filed at 10/09/12 0900  Gross per 24 hour  Intake      0 ml  Output      0 ml  Net      0 ml   Weight change:   PHYSICAL EXAM  General: Well developed, well nourished, in no acute distress. Head: Normocephalic, atraumatic, sclera non-icteric, no xanthomas, nares are without discharge.  Neck: Supple without bruits or JVD. Lungs:  Resp regular and unlabored, CTAB without wheezes, rales or rhonchi Heart: Regularly irregular, tachycardic, no s3, s4, or murmurs. Abdomen: Soft, non-tender, non-distended, BS + x 4.  Msk:  Strength and tone appears normal for age. Extremities: No clubbing, cyanosis or edema. DP/PT/Radials 2+ and equal bilaterally. Neuro: Alert and oriented X 3. Moves all extremities spontaneously. Psych: Normal affect.  LABS:  Recent Labs  Basename 10/08/12 0854   WBC 6.0   HGB 12.5*   HCT 37.4*   MCV 93.7   PLT 301    Lab 10/08/12 0854  NA 137  K 3.8  CL 101  CO2 24  BUN 15  CREATININE 0.96  CALCIUM 9.1  PROT --  BILITOT --  ALKPHOS --  ALT --  AST --  AMYLASE --  LIPASE --  GLUCOSE 131*   Recent Labs  Basename 10/08/12 0854   CKTOTAL --   CKMB --   CKMBINDEX --   TROPONINI <0.30    Basename 10/08/12 1751  TSH 0.558  T4TOTAL --  T3FREE --  THYROIDAB --    TELE: atrial flutter vs course atrial fib, 120-150s  ECG: atrial flutter vs course atrial fibrillation, 108 bpm, no ST/T changes   Radiology/Studies:  Dg Chest 2 View  10/08/2012  *RADIOLOGY REPORT*  Clinical Data: Shortness of breath and tachycardia  CHEST - 2 VIEW  Comparison: 09/06/2012  Findings: The heart size and mediastinal contours are within normal limits.  Both lungs are clear.  The visualized skeletal structures are unremarkable.  IMPRESSION: Negative exam.   Original Report Authenticated By: Taylor Stroud, M.D.    Ct Angio Chest Pe W/cm &/or Wo Cm  10/08/2012  *RADIOLOGY REPORT*  Clinical Data: Shortness of breath.  Tachycardia.  Atrial fibrillation.  CT ANGIOGRAPHY CHEST  Technique:  Multidetector CT imaging of the chest using the standard protocol during bolus administration of intravenous contrast. Multiplanar reconstructed images including MIPs were obtained and reviewed to evaluate the vascular anatomy.  Contrast: 100mL OMNIPAQUE IOHEXOL 350 MG/ML SOLN  Comparison: Chest x-ray dated 10/08/2012.  Chest CT dated 08/16/2011  Findings: There are no pulmonary emboli, infiltrates,   or effusions. There is slight scarring in the lung apices, stable.  Chronic bronchitic changes at the bases, right greater than left.  No acute osseous abnormality.  Heart size is normal.  Visualized portion of the upper abdomen is normal.  IMPRESSION: No acute abnormalities.  Specifically, no pulmonary emboli.   Original Report Authenticated By: James Maxwell, M.D.    Dg Pelvis Portable  09/12/2012  *RADIOLOGY REPORT*  Clinical Data: Status post left hip replacement.  PORTABLE PELVIS  Comparison: Plain film of the pelvis 08/16/2011.  Findings: The patient has a new left total hip replacement.  The device is located and no fracture is identified.  Gas in the soft tissues  and surgical drain are noted.  Old right hip replacement again noted.  IMPRESSION: New left total hip replacement without evidence of complication.   Original Report Authenticated By: Thomas D'Alessio, M.D.    Dg Hip Portable 1 View Left  09/12/2012  *RADIOLOGY REPORT*  Clinical Data: Status post left hip replacement.  PORTABLE LEFT HIP - 1 VIEW  Comparison: Plain film the pelvis 08/16/2011.  Findings: New left total hip replacement is in place.  The device is located.  No fracture is identified.  Surgical drain noted.  IMPRESSION: Left total hip replacement without evidence of complication.   Original Report Authenticated By: Thomas D'Alessio, M.D.    Dg C-arm 1-60 Min-no Report  09/12/2012  CLINICAL DATA: surgery   C-ARM 1-60 MINUTES  Fluoroscopy was utilized by the requesting physician.  No radiographic  interpretation.      Current Medications:     . aspirin EC  81 mg Oral q morning - 10a  . docusate sodium  100 mg Oral BID  . flecainide  100 mg Oral BID  . pantoprazole  40 mg Oral Daily  . sodium chloride  3 mL Intravenous Q12H  . warfarin  5 mg Oral Custom  . Warfarin - Physician Dosing Inpatient   Does not apply q1800    ASSESSMENT AND PLAN:  1. Atrial flutter- continues to c/o palpitations and DOE. Telemetry review indicates atrial flutter. Rates overnight range from 110s - 150s. EKG this AM does reveal flutter-type waves, in some ways does resemble course atrial fibrillation, especially given rate of 108 bpm. Have asked MD to review prior to TEE/DCCV today. This has tentatively been scheduled for 1330. Patient currently NPO, and vital signs/hemodynamics are otherwise stable. INR 2.14 today. Coumadin had been held for hip surgery earlier this month, and INRs were accordingly subtherapeutic. Continue flecainide.  2. Acute diastolic CHF- secondary to #1. pBNP mildly elevated. CXR clear. Notes persistent DOE. Euvolemic on exam - lungs fairly clear, no JVD or peripheral edema. Would hold  on diuresis for now. Suspect DOE with improve once converts to SR.   3. Hyperlipidemia - not on statin. Check lipids.   4. GERD- continue PPI.   5. AAA- 3.3 x 3.2 cm. Mild increase on 10/2011 repeat u/s. Currently stable.   6. Hypertension- BP WNL. Continue to monitor.    Signed, R. Tony Arguello, PA-C 10/09/2012, 10:01 AM As above; patient seen and examined; recurrent atrial flutter and history of atrial fibrillation; plan TEE DCCV today. Continue coumadin and flecanide; if recurrent atrial flutter in the future, could consider ablation. DC asa given need for coumadin. Brian Crenshaw 10:24 AM  

## 2012-10-09 NOTE — Transfer of Care (Signed)
Immediate Anesthesia Transfer of Care Note  Patient: Tyrone Mitchell  Procedure(s) Performed: Procedure(s) (LRB) with comments: TRANSESOPHAGEAL ECHOCARDIOGRAM (TEE) (N/A) CARDIOVERSION (N/A)  Patient Location: Endoscopy Unit  Anesthesia Type:General  Level of Consciousness: awake  Airway & Oxygen Therapy: Patient Spontanous Breathing and Patient connected to nasal cannula oxygen  Post-op Assessment: Report given to PACU RN, Post -op Vital signs reviewed and stable and Patient moving all extremities  Post vital signs: Reviewed and stable  Complications: No apparent anesthesia complications

## 2012-10-09 NOTE — CV Procedure (Signed)
See full TEE report in [employer]; no LAA identified; patient subsequently sedated by anesthesia with diprovan 45 mg IV; synchronized DCCV with 120 joules resulted in sinus rhythm; no immediate complications. Continue coumadin. Olga Millers

## 2012-10-09 NOTE — Anesthesia Preprocedure Evaluation (Signed)
Anesthesia Evaluation  Patient identified by MRN, date of birth, ID band Patient awake    Airway Mallampati: I TM Distance: >3 FB Neck ROM: full    Dental   Pulmonary former smoker,          Cardiovascular hypertension, + Peripheral Vascular Disease + dysrhythmias Atrial Fibrillation Rhythm:irregular Rate:Normal     Neuro/Psych PSYCHIATRIC DISORDERS  Neuromuscular disease    GI/Hepatic GERD-  ,  Endo/Other    Renal/GU      Musculoskeletal   Abdominal   Peds  Hematology   Anesthesia Other Findings   Reproductive/Obstetrics                           Anesthesia Physical Anesthesia Plan  ASA: III  Anesthesia Plan: General   Post-op Pain Management:    Induction: Intravenous  Airway Management Planned: Mask  Additional Equipment:   Intra-op Plan:   Post-operative Plan:   Informed Consent: I have reviewed the patients History and Physical, chart, labs and discussed the procedure including the risks, benefits and alternatives for the proposed anesthesia with the patient or authorized representative who has indicated his/her understanding and acceptance.     Plan Discussed with: CRNA, Anesthesiologist and Surgeon  Anesthesia Plan Comments:         Anesthesia Quick Evaluation

## 2012-10-10 ENCOUNTER — Encounter (HOSPITAL_COMMUNITY): Payer: Self-pay | Admitting: Cardiology

## 2012-10-10 LAB — CBC
HCT: 33.4 % — ABNORMAL LOW (ref 39.0–52.0)
Hemoglobin: 10.7 g/dL — ABNORMAL LOW (ref 13.0–17.0)
RBC: 3.51 MIL/uL — ABNORMAL LOW (ref 4.22–5.81)

## 2012-10-10 LAB — PROTIME-INR
INR: 2.38 — ABNORMAL HIGH (ref 0.00–1.49)
Prothrombin Time: 24.9 seconds — ABNORMAL HIGH (ref 11.6–15.2)

## 2012-10-10 NOTE — Discharge Summary (Signed)
Patient ID: Tyrone Mitchell,  MRN: [id number], DOB/AGE: 04-03-1941 71 y.o.  Admit date: 10/08/2012 Discharge date: 10/10/2012  Primary Care Provider: [name] Primary Cardiologist: G. Ladona Ridgel, MD  Discharge Diagnoses Principal Problem:  *Atrial flutter with rapid ventricular response  **S/P TEE and DCCV this admission. Active Problems:  HYPERTENSION, BENIGN  Warfarin anticoagulation  HYPERLIPIDEMIA-MIXED  Atrial fibrillation  AAA (abdominal aortic aneurysm)  Allergies Allergies  Allergen Reactions  . Penicillins Anaphylaxis    Swelling of the mouth   Procedures  CT Angio of the Chest with Contrast 10/08/2012  IMPRESSION: No acute abnormalities. Specifically, no pulmonary emboli. _____________  Transesophageal Echocardiogram and Cardioversion 10/09/2012  Study Conclusions  - Left ventricle: Systolic function was normal. The   estimated ejection fraction was in the range of 50% to   55%. Wall motion was normal; there were no regional wall   motion abnormalities. - Aortic valve: Sclerosis without stenosis. Mild   regurgitation. - Mitral valve: No evidence of vegetation. Mild   regurgitation. - Left atrium: No evidence of thrombus in the atrial cavity   or appendage. - Atrial septum: No defect or patent foramen ovale was   identified. There was an atrial septal aneurysm. - Tricuspid valve: No evidence of vegetation.  **Following TEE, he underwent DCCV with 1, 120 Joule shock resulting in sinus rhythm. _____________  History of Present Illness  71 y/o male with h/o afib and flutter, maintained on oral flecainide and coumadin therapy as an outpatient.  He was in his USOH until approximately 3 days prior to admission when he began to experience tachy-palpitations and fluttering sensation in his chest.  He presented to the [facility] ED on 12/29, where ECG showed atrial flutter with rapid ventricular response.  CT angio of the chest was performed was  negative for PE.  He was seen by cardiology and admitted for further evaluation.  Hospital Course  Following admission, historical INR's were reviewed and it was noted that his INR was subtherapeutic on 12/26 @ 1.79.  INR on admission was 2.14.  Decision was made to pursue TEE and cardioversion.  TEE was performed on 12/30 revealing normal LV function and no evidence of LAA thrombus.  He was then successfully cardioverted.  He has been maintained on his home doses of flecainide and coumadin and has had no further atrial flutter.  He will be discharged home today in good condition.  Discharge Vitals Blood pressure 113/70, pulse 79, temperature 98.4 F (36.9 C), temperature source Oral, resp. rate 20, height 6\' 2"  (1.88 m), weight 218 lb 4.1 oz (99 kg), SpO2 96.00%.  Filed Weights   10/08/12 1656 10/09/12 0600  Weight: 220 lb 0.3 oz (99.8 kg) 218 lb 4.1 oz (99 kg)   Labs  CBC  Basename 10/10/12 0525 10/09/12 0949 10/08/12 0854  WBC 5.5 6.0 --  NEUTROABS -- -- 4.0  HGB 10.7* 12.7* --  HCT 33.4* 38.4* --  MCV 95.2 94.3 --  PLT 242 286 --   Basic Metabolic Panel  Basename 10/09/12 0949 10/08/12 0854  NA 142 137  K 4.1 3.8  CL 104 101  CO2 28 24  GLUCOSE 122* 131*  BUN 13 15  CREATININE 1.06 0.96  CALCIUM 9.8 9.1  MG 2.0 --  PHOS -- --   Cardiac Enzymes  Basename 10/09/12 0949 10/08/12 0854  CKTOTAL -- --  CKMB -- --  CKMBINDEX -- --  TROPONINI <0.30 <0.30   Thyroid Function Tests  Basename 10/08/12 1751  TSH 0.558  T4TOTAL --  T3FREE --  THYROIDAB --   Disposition  Pt is being discharged home today in good condition.  Follow-up Plans & Appointments  Follow-up Information    Follow up with [name], MD. On 11/08/2012. (11:15 AM)    Contact information:   [address] [place] [address] [phone]       Follow up with [name], MD. (as scheduled for INR follow-up.)    Contact information:   [address] [facility] [address] [phone]        Discharge Medications    Medication List     As of 10/10/2012  1:09 PM    TAKE these medications         acetaminophen 500 MG tablet   Commonly known as: TYLENOL   Take 1,000 mg by mouth every 6 (six) hours as needed. For pain      aspirin EC 81 MG tablet   Take 81 mg by mouth every morning.      DSS 100 MG Caps   Take 100 mg by mouth 2 (two) times daily as needed. For constipation      flecainide 100 MG tablet   Commonly known as: TAMBOCOR   Take 100 mg by mouth 2 (two) times daily.      fluticasone 50 MCG/ACT nasal spray   Commonly known as: FLONASE   Place 2 sprays into the nose daily as needed. For congestion      omeprazole 20 MG capsule   Commonly known as: PRILOSEC   Take 20 mg by mouth every morning.      warfarin 5 MG tablet   Commonly known as: COUMADIN   Take 5 mg by mouth as directed. Takes 1 tablet every day except Tuesday and Thursday he does not take any      Outstanding Labs/Studies  Follow-up INR with PCP as scheduled  Duration of Discharge Encounter   Greater than 30 minutes including physician time.  Signed, Nicolasa Ducking NP 10/10/2012, 1:09 PM

## 2012-10-10 NOTE — Progress Notes (Signed)
   Subjective:  Patient feels well this am. Had successful cardioversion yesterday. Holding NSR today.  Objective:  Vital Signs in the last 24 hours: Temp:  [97.6 F (36.4 C)-98.5 F (36.9 C)] 98.4 F (36.9 C) (12/31 0630) Pulse Rate:  [68-118] 79  (12/31 0630) Resp:  [10-65] 20  (12/31 0630) BP: (87-121)/(53-83) 113/70 mmHg (12/31 0630) SpO2:  [96 %-99 %] 96 % (12/31 0630)  Intake/Output from previous day:   Intake/Output from this shift: Total I/O In: 240 [P.O.:240] Out: -      . docusate sodium  100 mg Oral BID  . flecainide  100 mg Oral BID  . pantoprazole  40 mg Oral Daily  . warfarin  5 mg Oral Custom  . Warfarin - Physician Dosing Inpatient   Does not apply q1800      Physical Exam: The patient appears to be in no distress.  Head and neck exam reveals that the pupils are equal and reactive.  The extraocular movements are full.  There is no scleral icterus.  Mouth and pharynx are benign.  No lymphadenopathy.  No carotid bruits.  The jugular venous pressure is normal.  Thyroid is not enlarged or tender.  Chest is clear to percussion and auscultation.  No rales or rhonchi.  Expansion of the chest is symmetrical.  Heart reveals no abnormal lift or heave.  First and second heart sounds are normal.  There is no murmur gallop rub or click.  The abdomen is soft and nontender.  Bowel sounds are normoactive.  There is no hepatosplenomegaly or mass.  There are no abdominal bruits.  Extremities reveal no phlebitis or edema.  Pedal pulses are good.  There is no cyanosis or clubbing.  Neurologic exam is normal strength and no lateralizing weakness.  No sensory deficits.  Integument reveals no rash  Lab Results:  Basename 10/10/12 0525 10/09/12 0949  WBC 5.5 6.0  HGB 10.7* 12.7*  PLT 242 286    Basename 10/09/12 0949 10/08/12 0854  NA 142 137  K 4.1 3.8  CL 104 101  CO2 28 24  GLUCOSE 122* 131*  BUN 13 15  CREATININE 1.06 0.96    Basename 10/09/12 0949  10/08/12 0854  TROPONINI <0.30 <0.30   Hepatic Function Panel No results found for this basename: PROT,ALBUMIN,AST,ALT,ALKPHOS,BILITOT,BILIDIR,IBILI in the last 72 hours No results found for this basename: CHOL in the last 72 hours No results found for this basename: PROTIME in the last 72 hours  Imaging: Imaging results have been reviewed  Cardiac Studies:  Assessment/Plan:  Patient Active Hospital Problem List: Atrial flutter with rapid ventricular response (10/08/2012)   Assessment: Back in NSR   Plan: Home today on warfarin and flecainide. INR 2.38   LOS: 2 days    Cassell Clement 10/10/2012, 8:47 AM

## 2012-10-16 ENCOUNTER — Telehealth: Payer: Self-pay | Admitting: Internal Medicine

## 2012-10-16 NOTE — Telephone Encounter (Signed)
Pt is back out of Rhythm again running about 106-114 and the patient needs to know what to do

## 2012-10-19 NOTE — Telephone Encounter (Signed)
Spoke with Mr Tyrone Mitchell he stated back in rhythm on Tues, he feels great now.

## 2012-10-24 ENCOUNTER — Other Ambulatory Visit: Payer: Self-pay | Admitting: Internal Medicine

## 2012-11-08 ENCOUNTER — Ambulatory Visit (INDEPENDENT_AMBULATORY_CARE_PROVIDER_SITE_OTHER): Payer: Medicare Other | Admitting: Internal Medicine

## 2012-11-08 ENCOUNTER — Encounter: Payer: Self-pay | Admitting: Internal Medicine

## 2012-11-08 VITALS — BP 133/67 | HR 62 | Ht 74.0 in | Wt 233.8 lb

## 2012-11-08 DIAGNOSIS — I4892 Unspecified atrial flutter: Secondary | ICD-10-CM

## 2012-11-08 DIAGNOSIS — I4891 Unspecified atrial fibrillation: Secondary | ICD-10-CM

## 2012-11-08 NOTE — Progress Notes (Signed)
HPI   [name] returns today for followup. He is a very pleasant 72 year old man with a history of atrial flutter and fibrillation. He was in the hospital several weeks ago with symptomatic atrial flutter and underwent TEE guided DC cardioversion. Since then he has had brief episodes of atrial flutter. The patient was taking tramadol and thinks that the tramadol was contributing to his propensity to go out of rhythm. In the last 2 weeks, he has had no symptomatic arrhythmias. He denies chest pain, shortness of breath, or alcohol abuse. Allergies  Allergen Reactions  . Penicillins Anaphylaxis    Swelling of the mouth     Current Outpatient Prescriptions  Medication Sig Dispense Refill  . acetaminophen (TYLENOL) 500 MG tablet Take 1,000 mg by mouth every 6 (six) hours as needed. For pain      . aspirin EC 81 MG tablet Take 81 mg by mouth every morning.      . flecainide (TAMBOCOR) 100 MG tablet TAKE  (1)  TABLET TWICE A DAY.  60 tablet  3  . fluticasone (FLONASE) 50 MCG/ACT nasal spray Place 2 sprays into the nose daily as needed. For congestion      . omeprazole (PRILOSEC) 20 MG capsule Take 20 mg by mouth every morning.       . traMADol (ULTRAM) 50 MG tablet       . warfarin (COUMADIN) 5 MG tablet Take 5 mg by mouth as directed. Takes 1 tablet every day except Tuesday and Thursday he does not take any         Past Medical History  Diagnosis Date  . Atrial fibrillation     a. on flecainide and coumadin  . GERD (gastroesophageal reflux disease)   . Hyperlipidemia   . Anemia   . Cancer 2010    prostate  . Atrial flutter     a. s/p TEE/DCCV 09/2012, EF 50-55% by TEE.  . Mild mitral and aortic regurgitation     a. by TEE 09/2012    ROS:   All systems reviewed and negative except as noted in the HPI.   Past Surgical History  Procedure Date  . Back surgery   . Hip surgery   . Cholecystectomy   . Prostatectomy   . Hernia repair   . Total hip arthroplasty 09/12/2012   Procedure: TOTAL HIP ARTHROPLASTY ANTERIOR APPROACH;  Surgeon: Shelda Pal, MD;  Location: [facility] ORS;  Service: Orthopedics;  Laterality: Left;  . Hip replacement 09/12/2012    left  . Tee without cardioversion 10/09/2012    Procedure: TRANSESOPHAGEAL ECHOCARDIOGRAM (TEE);  Surgeon: [name], MD;  Location: [facility] ENDOSCOPY;  Service: Cardiovascular;  Laterality: N/A;  . Cardioversion 10/09/2012    Procedure: CARDIOVERSION;  Surgeon: [name], MD;  Location: [facility] ENDOSCOPY;  Service: Cardiovascular;  Laterality: N/A;     No family history on file.   History   Social History  . Marital Status: Divorced    Spouse Name: N/A    Number of Children: N/A  . Years of Education: N/A   Occupational History  . Not on file.   Social History Main Topics  . Smoking status: Former Smoker    Types: Cigars    Quit date: 08/27/2011  . Smokeless tobacco: Not on file  . Alcohol Use: No  . Drug Use: No  . Sexually Active: Not on file   Other Topics Concern  . Not on file   Social History Narrative  . No  narrative on file     BP 133/67  Pulse 62  Ht 6\' 2"  (1.88 m)  Wt 233 lb 12.8 oz (106.051 kg)  BMI 30.02 kg/m2  Physical Exam:  Well appearing 72 year old man, NAD HEENT: Unremarkable Neck:  7 cm JVD, no thyromegally Lungs:  Clear with no wheezes, rales, or rhonchi. HEART:  Regular rate rhythm, no murmurs, no rubs, no clicks Abd:  soft, positive bowel sounds, no organomegally, no rebound, no guarding Ext:  2 plus pulses, no edema, no cyanosis, no clubbing Skin:  No rashes no nodules Neuro:  CN II through XII intact, motor grossly intact  EKG Sinus rhythm with first degree AV block, LVH.   Assess/Plan:

## 2012-11-08 NOTE — Assessment & Plan Note (Signed)
The patient has had recurrent atrial flutter on flecainide. We discussed the treatment options. Because his flutter now appears to be fairly well-controlled, I recommended watchful waiting. If his flutter returns and is sustained, he is instructed to call us and we would at that point schedule catheter ablation of his flutter.

## 2012-11-08 NOTE — Patient Instructions (Addendum)
Your physician wants you to follow-up in: 6 months with Dr Taylor You will receive a reminder letter in the mail two months in advance. If you don't receive a letter, please call our office to schedule the follow-up appointment.  

## 2012-11-08 NOTE — Assessment & Plan Note (Signed)
He is maintaining sinus rhythm very nicely on flecainide. He'll continue this medication.

## 2012-12-20 ENCOUNTER — Telehealth: Payer: Self-pay | Admitting: Internal Medicine

## 2012-12-20 NOTE — Telephone Encounter (Signed)
New Problem:    Patient called in wanting to know what the status was on an insurance form he dropped of on 11/02/12.  Please call back.

## 2012-12-20 NOTE — Telephone Encounter (Signed)
Will forward to Dennis Bast for follow up.

## 2012-12-29 NOTE — Telephone Encounter (Signed)
Spoke with patient and let him know will call him when Dr Ladona Ridgel signs form

## 2013-01-10 ENCOUNTER — Encounter: Payer: Self-pay | Admitting: Internal Medicine

## 2013-01-10 ENCOUNTER — Encounter (INDEPENDENT_AMBULATORY_CARE_PROVIDER_SITE_OTHER): Payer: Medicare Other

## 2013-01-10 ENCOUNTER — Ambulatory Visit (INDEPENDENT_AMBULATORY_CARE_PROVIDER_SITE_OTHER): Payer: Medicare Other | Admitting: Internal Medicine

## 2013-01-10 VITALS — BP 120/71 | HR 65 | Ht 74.0 in | Wt 228.4 lb

## 2013-01-10 DIAGNOSIS — I714 Abdominal aortic aneurysm, without rupture: Secondary | ICD-10-CM

## 2013-01-10 DIAGNOSIS — I4891 Unspecified atrial fibrillation: Secondary | ICD-10-CM

## 2013-01-10 NOTE — Progress Notes (Signed)
kf Patient Care Team: Joette Catching, MD as PCP - General (Family Medicine)   HPI  Tyrone Mitchell is a 73 y.o. male Seen as an eadd on today. He has a history of atrial fibrillation for which he is seeing Dr. Ladona Ridgel. He is actually had atrial flutter also on flecainide. He has a history of abdominal aortic aneurysm  This appointment seems to be made in error. I did mention to him that I would strongly consider the discontinuation of his aspirin as it does not have additional incremental benefit to warfarin  Past Medical History  Diagnosis Date  . Atrial fibrillation     a. on flecainide and coumadin  . GERD (gastroesophageal reflux disease)   . Hyperlipidemia   . Anemia   . Cancer 2010    prostate  . Atrial flutter     a. s/p TEE/DCCV 09/2012, EF 50-55% by TEE.  . Mild mitral and aortic regurgitation     a. by TEE 09/2012    Past Surgical History  Procedure Laterality Date  . Back surgery    . Hip surgery    . Cholecystectomy    . Prostatectomy    . Hernia repair    . Total hip arthroplasty  09/12/2012    Procedure: TOTAL HIP ARTHROPLASTY ANTERIOR APPROACH;  Surgeon: Shelda Pal, MD;  Location: [facility] ORS;  Service: Orthopedics;  Laterality: Left;  . Hip replacement  09/12/2012    left  . Tee without cardioversion  10/09/2012    Procedure: TRANSESOPHAGEAL ECHOCARDIOGRAM (TEE);  Surgeon: [name], MD;  Location: [facility] ENDOSCOPY;  Service: Cardiovascular;  Laterality: N/A;  . Cardioversion  10/09/2012    Procedure: CARDIOVERSION;  Surgeon: [name], MD;  Location: [facility] ENDOSCOPY;  Service: Cardiovascular;  Laterality: N/A;    Current Outpatient Prescriptions  Medication Sig Dispense Refill  . acetaminophen (TYLENOL) 500 MG tablet Take 1,000 mg by mouth every 6 (six) hours as needed. For pain      . aspirin EC 81 MG tablet Take 81 mg by mouth every morning.      . flecainide (TAMBOCOR) 100 MG tablet TAKE  (1)  TABLET TWICE A DAY.  60 tablet  3  . levofloxacin  (LEVAQUIN) 500 MG tablet Take 500 mg by mouth daily.       Marland Kitchen omeprazole (PRILOSEC) 20 MG capsule Take 20 mg by mouth every morning.       . triamcinolone (NASACORT) 55 MCG/ACT nasal inhaler Place 1 spray into the nose daily.       Marland Kitchen warfarin (COUMADIN) 5 MG tablet Take 5 mg by mouth as directed. Takes 1 tablet every day except Tuesday and Thursday he does not take any       No current facility-administered medications for this visit.    Allergies  Allergen Reactions  . Penicillins Anaphylaxis    Swelling of the mouth    Review of Systems negative except from HPI and PMH  Physical Exam BP 120/71  Pulse 65  Ht 6\' 2"  (1.88 m)  Wt 228 lb 6.4 oz (103.602 kg)  BMI 29.31 kg/m2   Assessment and  Plan

## 2013-01-10 NOTE — Assessment & Plan Note (Signed)
The data was also hurting for the use of aspirin plus warfarin if her thromboembolic risk reduction. Hence I asked him to discontinue his aspirin. In addition, there are no data of which I am aware that NOAC plus  nsaids be safer than warfarin plus NSAIDs. Recommendation would be to use the lowest amount of NSAIDs possible.

## 2013-01-11 ENCOUNTER — Telehealth: Payer: Self-pay | Admitting: Internal Medicine

## 2013-01-11 NOTE — Telephone Encounter (Signed)
Pt Aware [employer] [place] paper signed Mailed to Pt  Home address per pt Request 01/11/13/KM

## 2013-01-16 ENCOUNTER — Telehealth: Payer: Self-pay | Admitting: *Deleted

## 2013-01-16 ENCOUNTER — Encounter: Payer: Self-pay | Admitting: Physician Assistant

## 2013-01-16 MED ORDER — NAPROXEN SODIUM 220 MG PO CAPS: 220.0000 mg | ORAL_CAPSULE | Freq: Three times a day (TID) | ORAL | Status: DC | PRN

## 2013-01-16 MED ORDER — RIVAROXABAN 20 MG PO TABS: 20.0000 mg | ORAL_TABLET | Freq: Every day | ORAL | Status: DC

## 2013-01-16 NOTE — Telephone Encounter (Signed)
Message copied by Tarri Fuller on Tue Jan 16, 2013  1:52 PM ------      Message from: [place], [place] T      Created: Tue Jan 16, 2013 12:48 PM       Stable AAA 3.2 cm      F/u U/S in 1 year.      Tereso Newcomer, PA-C  12:48 PM 01/16/2013 ------

## 2013-01-16 NOTE — Telephone Encounter (Signed)
pt notified about AAA results with verbal understanding. Pt states he is not on warfarin now and is on Xarelto 20 mg qd per Dr. Lysbeth Galas a couple of weeks ago, said he told Dr. Graciela Husbands, I noted did not see this in 4/2 note from [facility]. Pt states he was told by Dr. Lysbeth Galas that he could not take aleve if he was on coumadin, pt states he s/w Dr. Graciela Husbands about this and said Dr. Graciela Husbands said there was no information supporting this. Pt states Dr. Lysbeth Galas "transitioned him on Xarelto 20 mg daily", so he could take aleve for his pain. Pt states to me today Xarelto is expensive. I advised he could s/w Dr. Lysbeth Galas more about this or could come in and see Dr. Ladona Ridgel his cardiologist to discuss possible other options, pt then states he will see how the pain thing works with being on xarelto and taking the aleve. If it does not work out then pt states he may then just go back on coumadin. Pt advised to make appt to see Dr. Ladona Ridgel to discuss further.

## 2013-03-01 ENCOUNTER — Other Ambulatory Visit: Payer: Self-pay | Admitting: Internal Medicine

## 2013-04-02 ENCOUNTER — Telehealth: Payer: Self-pay | Admitting: Internal Medicine

## 2013-04-02 NOTE — Telephone Encounter (Signed)
lmom for patient to return my call 

## 2013-04-02 NOTE — Telephone Encounter (Signed)
New Prob     Pt has some questions regarding his past visits. Please call.

## 2013-04-03 NOTE — Telephone Encounter (Signed)
Yehuda Mao is going to speak with patient and handle account

## 2013-04-03 NOTE — Telephone Encounter (Signed)
Spoke with patient and he is waiting for a call from Sheppard Penton, they have been playing phone tag.  She is going to handle for him

## 2013-04-19 ENCOUNTER — Ambulatory Visit (INDEPENDENT_AMBULATORY_CARE_PROVIDER_SITE_OTHER): Payer: Medicare Other | Admitting: Internal Medicine

## 2013-04-19 ENCOUNTER — Encounter: Payer: Self-pay | Admitting: Internal Medicine

## 2013-04-19 VITALS — BP 126/78 | HR 57 | Ht 74.0 in | Wt 233.8 lb

## 2013-04-19 DIAGNOSIS — I4891 Unspecified atrial fibrillation: Secondary | ICD-10-CM

## 2013-04-19 DIAGNOSIS — I10 Essential (primary) hypertension: Secondary | ICD-10-CM

## 2013-04-19 NOTE — Patient Instructions (Addendum)
Your physician wants you to follow-up in: ONE YEAR WITH DR TAYLOR You will receive a reminder letter in the mail two months in advance. If you don't receive a letter, please call our office to schedule the follow-up appointment.  

## 2013-04-19 NOTE — Progress Notes (Signed)
HPI Tyrone Mitchell returns today for followup. He is a very pleasant 72 year old man with a history of paroxysmal atrial fibrillation and hypertension, who has been well controlled with flecainide therapy. He is also systemically anticoagulated. In the interim, he denies chest pain or shortness of breath. He does have occasional dizzy episodes which are not related to upright posture. He denies symptoms of orthostasis. No syncope. He has very minimal palpitations. Allergies  Allergen Reactions  . Penicillins Anaphylaxis    Swelling of the mouth     Current Outpatient Prescriptions  Medication Sig Dispense Refill  . flecainide (TAMBOCOR) 100 MG tablet TAKE  (1)  TABLET TWICE A DAY.  60 tablet  4  . Naproxen Sodium 220 MG CAPS Take 1 capsule (220 mg total) by mouth every 8 (eight) hours as needed.      Marland Kitchen omeprazole (PRILOSEC) 20 MG capsule Take 20 mg by mouth every morning.       . Rivaroxaban (XARELTO) 20 MG TABS Take 1 tablet (20 mg total) by mouth daily. THIS WAS STARTED BY DR. Lysbeth Galas PER PT      . triamcinolone (NASACORT) 55 MCG/ACT nasal inhaler Place 1 spray into the nose daily.        No current facility-administered medications for this visit.     Past Medical History  Diagnosis Date  . Atrial fibrillation     a. on flecainide and coumadin  . GERD (gastroesophageal reflux disease)   . Hyperlipidemia   . Anemia   . Cancer 2010    prostate  . Atrial flutter     a. s/p TEE/DCCV 09/2012, EF 50-55% by TEE.  . Mild mitral and aortic regurgitation     a. by TEE 09/2012  . AAA (abdominal aortic aneurysm)     Abd U/S 4/14:  Infrarenal AAA 3.2 x 3.1 cm, bilat CIA mildly dilated with slight increase on L, mild aorto-iliac atherosclerosis w/o stenosis - f/u 1 year    ROS:   All systems reviewed and negative except as noted in the HPI.   Past Surgical History  Procedure Laterality Date  . Back surgery    . Hip surgery    . Cholecystectomy    . Prostatectomy    . Hernia repair     . Total hip arthroplasty  09/12/2012    Procedure: TOTAL HIP ARTHROPLASTY ANTERIOR APPROACH;  Surgeon: Shelda Pal, MD;  Location: [facility];  Service: Orthopedics;  Laterality: Left;  . Hip replacement  09/12/2012    left  . Tee without cardioversion  10/09/2012    Procedure: TRANSESOPHAGEAL ECHOCARDIOGRAM (TEE);  Surgeon: [name], MD;  Location: [facility] ENDOSCOPY;  Service: Cardiovascular;  Laterality: N/A;  . Cardioversion  10/09/2012    Procedure: CARDIOVERSION;  Surgeon: [name], MD;  Location: [facility] ENDOSCOPY;  Service: Cardiovascular;  Laterality: N/A;     No family history on file.   History   Social History  . Marital Status: Divorced    Spouse Name: N/A    Number of Children: N/A  . Years of Education: N/A   Occupational History  . Not on file.   Social History Main Topics  . Smoking status: Former Smoker    Types: Cigars    Quit date: 08/27/2011  . Smokeless tobacco: Not on file  . Alcohol Use: No  . Drug Use: No  . Sexually Active: Not on file   Other Topics Concern  . Not on file   Social History Narrative  .  No narrative on file     BP 126/78  Pulse 57  Ht 6\' 2"  (1.88 m)  Wt 233 lb 12.8 oz (106.051 kg)  BMI 30.01 kg/m2  Physical Exam:  Well appearing NAD HEENT: Unremarkable Neck:  7 cm JVD, no thyromegally Back:  No CVA tenderness Lungs:  Clear with no wheezes, rales, or rhonchi. HEART:  Regular rate rhythm, no murmurs, no rubs, no clicks Abd:  soft, positive bowel sounds, no organomegally, no rebound, no guarding Ext:  2 plus pulses, no edema, no cyanosis, no clubbing Skin:  No rashes no nodules Neuro:  CN II through XII intact, motor grossly intact  EKG - normal sinus rhythm   Assess/Plan:

## 2013-04-19 NOTE — Assessment & Plan Note (Signed)
He is maintaining sinus rhythm very nicely. He will continue his current medical therapy. 

## 2013-04-19 NOTE — Assessment & Plan Note (Signed)
His blood pressure is well controlled. He will continue his current medical therapy. He is instructed to maintain a low-sodium diet.

## 2013-07-27 ENCOUNTER — Other Ambulatory Visit: Payer: Self-pay | Admitting: Internal Medicine

## 2013-10-17 ENCOUNTER — Telehealth: Payer: Self-pay | Admitting: Internal Medicine

## 2013-10-17 NOTE — Telephone Encounter (Signed)
New Problem:  Pt states he is experiencing a-fib.Marland Kitchen and SOB when he exherts himself... Pt states he is not having SOB now.. Pt would like a call back.

## 2013-10-17 NOTE — Telephone Encounter (Signed)
Has been doing well up until 6 days ago started having afib and has been continuous now since.  HR range 70's- 122.  He is experiencing SOB.  I will add him on tomorrow to see Dr Lovena Le at 1:45 to discuss treatment options.

## 2013-10-18 ENCOUNTER — Encounter (INDEPENDENT_AMBULATORY_CARE_PROVIDER_SITE_OTHER): Payer: Self-pay

## 2013-10-18 ENCOUNTER — Encounter (HOSPITAL_COMMUNITY): Payer: Self-pay

## 2013-10-18 ENCOUNTER — Ambulatory Visit (INDEPENDENT_AMBULATORY_CARE_PROVIDER_SITE_OTHER): Payer: Medicare Other | Admitting: Internal Medicine

## 2013-10-18 ENCOUNTER — Encounter: Payer: Self-pay | Admitting: Internal Medicine

## 2013-10-18 ENCOUNTER — Encounter: Payer: Self-pay | Admitting: *Deleted

## 2013-10-18 VITALS — BP 129/72 | HR 97 | Ht 74.0 in | Wt 238.1 lb

## 2013-10-18 DIAGNOSIS — I10 Essential (primary) hypertension: Secondary | ICD-10-CM

## 2013-10-18 DIAGNOSIS — I4891 Unspecified atrial fibrillation: Secondary | ICD-10-CM

## 2013-10-18 DIAGNOSIS — Z01812 Encounter for preprocedural laboratory examination: Secondary | ICD-10-CM

## 2013-10-18 DIAGNOSIS — I4892 Unspecified atrial flutter: Secondary | ICD-10-CM

## 2013-10-18 LAB — CBC WITH DIFFERENTIAL/PLATELET
BASOS PCT: 0.4 % (ref 0.0–3.0)
Basophils Absolute: 0 10*3/uL (ref 0.0–0.1)
Eosinophils Absolute: 0.2 10*3/uL (ref 0.0–0.7)
Eosinophils Relative: 2.4 % (ref 0.0–5.0)
HCT: 45 % (ref 39.0–52.0)
Hemoglobin: 15.2 g/dL (ref 13.0–17.0)
LYMPHS PCT: 24.1 % (ref 12.0–46.0)
Lymphs Abs: 1.8 10*3/uL (ref 0.7–4.0)
MCHC: 33.8 g/dL (ref 30.0–36.0)
MCV: 92.5 fl (ref 78.0–100.0)
MONO ABS: 0.7 10*3/uL (ref 0.1–1.0)
MONOS PCT: 9.6 % (ref 3.0–12.0)
NEUTROS PCT: 63.5 % (ref 43.0–77.0)
Neutro Abs: 4.7 10*3/uL (ref 1.4–7.7)
Platelets: 175 10*3/uL (ref 150.0–400.0)
RBC: 4.86 Mil/uL (ref 4.22–5.81)
RDW: 15.4 % — ABNORMAL HIGH (ref 11.5–14.6)
WBC: 7.4 10*3/uL (ref 4.5–10.5)

## 2013-10-18 LAB — BASIC METABOLIC PANEL
BUN: 19 mg/dL (ref 6–23)
CHLORIDE: 105 meq/L (ref 96–112)
CO2: 28 mEq/L (ref 19–32)
CREATININE: 1.4 mg/dL (ref 0.4–1.5)
Calcium: 9.4 mg/dL (ref 8.4–10.5)
GFR: 53.95 mL/min — ABNORMAL LOW (ref >60.00)
Glucose, Bld: 134 mg/dL — ABNORMAL HIGH (ref 70–99)
Potassium: 4.2 mEq/L (ref 3.5–5.1)
Sodium: 140 mEq/L (ref 135–145)

## 2013-10-18 NOTE — Assessment & Plan Note (Signed)
His atrial fib appears to be well controlled on flecainide. Will contine along with Xarelto.

## 2013-10-18 NOTE — Progress Notes (Signed)
HPI Mr. Boody returns today for followup. He is a very pleasant 73 year old man with a history of paroxysmal atrial fibrillation and hypertension, who has been well controlled with flecainide therapy. He is also systemically anticoagulated.  He denies chest pain or shortness of breath. In the interim, he does have sob and palpitations and has noted that when he checks his blood pressure, his HR is elevated.  Allergies  Allergen Reactions  . Penicillins Anaphylaxis    Swelling of the mouth     Current Outpatient Prescriptions  Medication Sig Dispense Refill  . flecainide (TAMBOCOR) 100 MG tablet TAKE  (1)  TABLET TWICE A DAY.  60 tablet  5  . IRON, FERROUS GLUCONATE, PO Take by mouth daily.      . Naproxen Sodium 220 MG CAPS Take 1 capsule (220 mg total) by mouth every 8 (eight) hours as needed.      Marland Kitchen omeprazole (PRILOSEC) 20 MG capsule Take 20 mg by mouth every morning.       . Rivaroxaban (XARELTO) 20 MG TABS Take 1 tablet (20 mg total) by mouth daily. THIS WAS STARTED BY DR. Edrick Oh PER PT      . triamcinolone (NASACORT) 55 MCG/ACT nasal inhaler Place 1 spray into the nose daily.        No current facility-administered medications for this visit.     Past Medical History  Diagnosis Date  . Atrial fibrillation     a. on flecainide and coumadin  . GERD (gastroesophageal reflux disease)   . Hyperlipidemia   . Anemia   . Cancer 2010    prostate  . Atrial flutter     a. s/p TEE/DCCV 09/2012, EF 50-55% by TEE.  . Mild mitral and aortic regurgitation     a. by TEE 09/2012  . AAA (abdominal aortic aneurysm)     Abd U/S 4/14:  Infrarenal AAA 3.2 x 3.1 cm, bilat CIA mildly dilated with slight increase on L, mild aorto-iliac atherosclerosis w/o stenosis - f/u 1 year    ROS:   All systems reviewed and negative except as noted in the HPI.   Past Surgical History  Procedure Laterality Date  . Back surgery    . Hip surgery    . Cholecystectomy    . Prostatectomy    . Hernia  repair    . Total hip arthroplasty  09/12/2012    Procedure: TOTAL HIP ARTHROPLASTY ANTERIOR APPROACH;  Surgeon: Mauri Pole, MD;  Location: [facility];  Service: Orthopedics;  Laterality: Left;  . Hip replacement  09/12/2012    left  . Tee without cardioversion  10/09/2012    Procedure: TRANSESOPHAGEAL ECHOCARDIOGRAM (TEE);  Surgeon: Lelon Perla, MD;  Location[facility] ENDOSCOPY;  Service: Cardiovascular;  Laterality: N/A;  . Cardioversion  10/09/2012    Procedure: CARDIOVERSION;  Surgeon: Lelon Perla, MD;  Location: [facility] ENDOSCOPY;  Service: Cardiovascular;  Laterality: N/A;     No family history on file.   History   Social History  . Marital Status: Divorced    Spouse Name: N/A    Number of Children: N/A  . Years of Education: N/A   Occupational History  . Not on file.   Social History Main Topics  . Smoking status: Former Smoker    Types: Cigars    Quit date: 08/27/2011  . Smokeless tobacco: Not on file  . Alcohol Use: No  . Drug Use: No  . Sexual Activity: Not on file   Other Topics Concern  .  Not on file   Social History Narrative  . No narrative on file     BP 129/72  Pulse 97  Ht 6\' 2"  (1.88 m)  Wt 238 lb 1.9 oz (108.011 kg)  BMI 30.56 kg/m2  Physical Exam:  Well appearing 73 yo man, NAD HEENT: Unremarkable Neck:  7 cm JVD, no thyromegally Back:  No CVA tenderness Lungs:  Clear with no wheezes, rales, or rhonchi. HEART:  IRegular rate rhythm, no murmurs, no rubs, no clicks Abd:  soft, positive bowel sounds, no organomegally, no rebound, no guarding Ext:  2 plus pulses, no edema, no cyanosis, no clubbing Skin:  No rashes no nodules Neuro:  CN II through XII intact, motor grossly intact  EKG - atrial flutter with a varable VR at 97/min.   Assess/Plan:

## 2013-10-18 NOTE — Patient Instructions (Signed)
Your physician recommends that you have pre-procedure lab work today: BMET/CBCD  Your physician has recommended that you have an atrial flutter ablation. Catheter ablation is a medical procedure used to treat some cardiac arrhythmias (irregular heartbeats). During catheter ablation, a long, thin, flexible tube is put into a blood vessel in your groin (upper thigh), or neck. This tube is called an ablation catheter. It is then guided to your heart through the blood vessel. Radio frequency waves destroy small areas of heart tissue where abnormal heartbeats may cause an arrhythmia to start. Please see the instruction sheet given to you today.  You are scheduled for 10/25/13 at 10:30 am

## 2013-10-18 NOTE — Assessment & Plan Note (Signed)
This appears to be a new problem. I have discussed the treatment options with the patient and the risks/benefits/goals/expectations of catheter ablation of atrial flutter have been discussed and he wishes to proceed. He will likely require initial uptitration of his flecainide for 4-6 weeks after the ablation.

## 2013-10-18 NOTE — Assessment & Plan Note (Signed)
His blood pressure is well controlled. Will follow. 

## 2013-10-19 ENCOUNTER — Telehealth: Payer: Self-pay | Admitting: Internal Medicine

## 2013-10-19 NOTE — Telephone Encounter (Signed)
New message  Pt called states that livalo 2 mg daily. Was left off of his medications list. He wanted to be sure that it was added to his list of medications and that it is safe to take. Please call

## 2013-10-22 ENCOUNTER — Telehealth: Payer: Self-pay | Admitting: Internal Medicine
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
## 2013-10-22 NOTE — Telephone Encounter (Signed)
Should not be a conflict.  Patient aware

## 2013-10-25 ENCOUNTER — Ambulatory Visit (HOSPITAL_COMMUNITY)
Admission: RE | Admit: 2013-10-25 | Discharge: 2013-10-25 | Disposition: A | Discharge status: Home / Self Care | Payer: Medicare Other | Source: Ambulatory Visit | Attending: Internal Medicine | Admitting: Internal Medicine

## 2013-10-25 ENCOUNTER — Encounter (HOSPITAL_COMMUNITY)
Admission: RE | Disposition: A | Discharge status: Home / Self Care | Payer: Self-pay | Source: Ambulatory Visit | Attending: Internal Medicine

## 2013-10-25 DIAGNOSIS — I4892 Unspecified atrial flutter: Secondary | ICD-10-CM | POA: Insufficient documentation

## 2013-10-25 DIAGNOSIS — Z538 Procedure and treatment not carried out for other reasons: Secondary | ICD-10-CM | POA: Insufficient documentation

## 2013-10-25 SURGERY — ATRIAL FLUTTER ABLATION
Anesthesia: LOCAL

## 2013-10-25 NOTE — Discharge Instructions (Signed)
·   Continue your Xarelto and other medications  Restart  Flecainide 100mg  twice daily. Increase Flecainide to 150mg  twice daily on Sunday.  Return to office on Friday , 1/23 for EKG at 900am  Call if questions.

## 2013-10-25 NOTE — Progress Notes (Signed)
Per pt discontinued xarleto 10/21/13, spoke with Anderson Malta, RN will follow-up

## 2013-11-13 ENCOUNTER — Other Ambulatory Visit: Payer: Self-pay | Admitting: *Deleted

## 2013-11-13 MED ORDER — FLECAINIDE ACETATE 100 MG PO TABS: ORAL_TABLET | ORAL | Status: DC

## 2013-11-13 NOTE — Telephone Encounter (Signed)
Spoke with patient and he says Dr Lovena Le increased his dose of Flecainide to 150 bid his last office visit   I have changed and called in for him

## 2013-11-26 ENCOUNTER — Telehealth: Payer: Self-pay | Admitting: *Deleted

## 2013-11-26 MED ORDER — FLECAINIDE ACETATE 50 MG PO TABS: 75.0000 mg | ORAL_TABLET | Freq: Two times a day (BID) | ORAL | Status: DC

## 2013-11-26 NOTE — Telephone Encounter (Signed)
Message copied by Fernande Boyden on Mon Nov 26, 2013  1:37 PM ------      Message from: Evans Lance      Created: Sat Nov 24, 2013  5:42 PM      Regarding: RE: medication discrepancy       That would be 75 mg twice daily. GT      ----- Message -----         From: Fernande Boyden, RN         Sent: 11/13/2013   6:10 PM           To: Dionicio Stall, RN, Evans Lance, MD, #      Subject: medication discrepancy                                   [place] states flecainide dose change to 1-1/2 tablet twice a day per patient, please clarify dose, thanks, Lovett Sox, RN       ------

## 2013-12-04 ENCOUNTER — Encounter: Payer: Self-pay | Admitting: Internal Medicine

## 2013-12-04 ENCOUNTER — Ambulatory Visit (INDEPENDENT_AMBULATORY_CARE_PROVIDER_SITE_OTHER): Payer: Medicare Other | Admitting: Internal Medicine

## 2013-12-04 ENCOUNTER — Encounter (INDEPENDENT_AMBULATORY_CARE_PROVIDER_SITE_OTHER): Payer: Self-pay

## 2013-12-04 VITALS — BP 115/77 | HR 98 | Ht 73.0 in | Wt 234.0 lb

## 2013-12-04 DIAGNOSIS — I4891 Unspecified atrial fibrillation: Secondary | ICD-10-CM

## 2013-12-04 MED ORDER — METOPROLOL TARTRATE 50 MG PO TABS: 50.0000 mg | ORAL_TABLET | Freq: Two times a day (BID) | ORAL | Status: DC

## 2013-12-04 NOTE — Assessment & Plan Note (Signed)
His ventricular rate is not well controlled. I have discussed the treatment options. Rate control vs rhythm control, either with [name] or amiodarone have been reviewed. He would like to start out with a trial of rate control and I have asked him to start metoprolol 25 mg twice daily for two weeks then increase to 50 mg twice daily. I will see him back in 6 weeks and we will reassess how he is doing.

## 2013-12-04 NOTE — Patient Instructions (Signed)
Please stop your Flecainide. Start Metoprolol tartrate 50 mg 1/2 tablet twice a day for 2 weeks and then increase to 1 tablet twice a day. Continue all other medications as listed  Follow up in 6 weeks with Dr Lovena Le.

## 2013-12-04 NOTE — Progress Notes (Signed)
HPI [name] returns today for followup. He is a very pleasant 73 year old man with a history of paroxysmal atrial fibrillation and hypertension, who has been well controlled with flecainide therapy. He is also systemically anticoagulated, on chronic Xarelto therapy  He was scheduled for catheter ablation of atrial flutter on flecainide several weeks ago but on presentation was found to be in atrial fibrillation. His case was cancelled. He has continued in atrial fib with an RVR. He denies syncope or chest pain. Minimal palpitations. Allergies  Allergen Reactions  . Penicillins Anaphylaxis    Swelling of the mouth     Current Outpatient Prescriptions  Medication Sig Dispense Refill  . flecainide (TAMBOCOR) 50 MG tablet Take 1.5 tablets (75 mg total) by mouth 2 (two) times daily.  60 tablet  6  . IRON, FERROUS GLUCONATE, PO Take by mouth daily.      . Naproxen Sodium 220 MG CAPS Take 1 capsule (220 mg total) by mouth every 8 (eight) hours as needed.      Marland Kitchen omeprazole (PRILOSEC) 20 MG capsule Take 20 mg by mouth every morning.       . Pitavastatin Calcium (LIVALO) 2 MG TABS Take by mouth.      . Rivaroxaban (XARELTO) 20 MG TABS Take 1 tablet (20 mg total) by mouth daily. THIS WAS STARTED BY DR. Edrick Oh PER PT      . triamcinolone (NASACORT) 55 MCG/ACT nasal inhaler Place 1 spray into the nose daily.        No current facility-administered medications for this visit.     Past Medical History  Diagnosis Date  . Atrial fibrillation     a. on flecainide and coumadin  . GERD (gastroesophageal reflux disease)   . Hyperlipidemia   . Anemia   . Cancer 2010    prostate  . Atrial flutter     a. s/p TEE/DCCV 09/2012, EF 50-55% by TEE.  . Mild mitral and aortic regurgitation     a. by TEE 09/2012  . AAA (abdominal aortic aneurysm)     Abd U/S 4/14:  Infrarenal AAA 3.2 x 3.1 cm, bilat CIA mildly dilated with slight increase on L, mild aorto-iliac atherosclerosis w/o stenosis - f/u 1 year     ROS:   All systems reviewed and negative except as noted in the HPI.   Past Surgical History  Procedure Laterality Date  . Back surgery    . Hip surgery    . Cholecystectomy    . Prostatectomy    . Hernia repair    . Total hip arthroplasty  09/12/2012    Procedure: TOTAL HIP ARTHROPLASTY ANTERIOR APPROACH;  Surgeon: Mauri Pole, MD;  Location: [facility]S;  Service: Orthopedics;  Laterality: Left;  . Hip replacement  09/12/2012    left  . Tee without cardioversion  10/09/2012    Procedure: TRANSESOPHAGEAL ECHOCARDIOGRAM (TEE);  Surgeon: Lelon Perla, MD;  Location: [facility] ENDOSCOPY;  Service: Cardiovascular;  Laterality: N/A;  . Cardioversion  10/09/2012    Procedure: CARDIOVERSION;  Surgeon: Lelon Perla, MD;  Location: [facility] ENDOSCOPY;  Service: Cardiovascular;  Laterality: N/A;     No family history on file.   History   Social History  . Marital Status: Divorced    Spouse Name: N/A    Number of Children: N/A  . Years of Education: N/A   Occupational History  . Not on file.   Social History Main Topics  . Smoking status: Former Smoker    Types:  Cigars    Quit date: 08/27/2011  . Smokeless tobacco: Not on file  . Alcohol Use: No  . Drug Use: No  . Sexual Activity: Not on file   Other Topics Concern  . Not on file   Social History Narrative  . No narrative on file     BP 115/77  Pulse 98  Ht 6\' 1"  (1.854 m)  Wt 234 lb (106.142 kg)  BMI 30.88 kg/m2  Physical Exam:  Well appearing 73 yo man, NAD HEENT: Unremarkable Neck:  7 cm JVD, no thyromegally Back:  No CVA tenderness Lungs:  Clear with no wheezes, rales, or rhonchi. HEART:  IRegular rate rhythm, no murmurs, no rubs, no clicks Abd:  soft, positive bowel sounds, no organomegally, no rebound, no guarding Ext:  2 plus pulses, no edema, no cyanosis, no clubbing Skin:  No rashes no nodules Neuro:  CN II through XII intact, motor grossly intact  EKG - atrial fibrillation with a varable VR at  97/min.   Assess/Plan:

## 2013-12-11 ENCOUNTER — Telehealth: Payer: Self-pay | Admitting: Internal Medicine

## 2013-12-11 NOTE — Telephone Encounter (Signed)
Patient is due for injection in his back.  Had to cancel today due to not being told to stop his Xarelto  Dr Lovena Le says okay to stop 48 hours prior and restart 24 hours after.  He wants the patient to also come in for BP check and EKG next Thurs while he is in the office

## 2013-12-11 NOTE — Telephone Encounter (Signed)
Per Dr Lovena Le may stop 48 hours prior to injection and restart 24 hours after.  Left message for Estill Bamberg with instructions

## 2013-12-11 NOTE — Telephone Encounter (Signed)
New message    Patient need to discuss stop xarelto 5 days  - injection in back .   C/O  irregular blood pressure  Today 99/53 . Hr 60 . Medication was change.

## 2013-12-11 NOTE — Telephone Encounter (Signed)
New message     Need pt to stop xarelto for 5days prior to lumbar injection.  Will this be OK?

## 2013-12-12 ENCOUNTER — Telehealth: Payer: Self-pay | Admitting: Internal Medicine

## 2013-12-12 NOTE — Telephone Encounter (Signed)
New message    Marlowe Kays calling from [facility] returning nurse call from yesterday.

## 2013-12-12 NOTE — Telephone Encounter (Signed)
Spoke with Tyrone Mitchell and let her know Dr Tanna Furry recommendations.  Patient aware

## 2013-12-13 ENCOUNTER — Ambulatory Visit (INDEPENDENT_AMBULATORY_CARE_PROVIDER_SITE_OTHER): Payer: Medicare Other | Admitting: *Deleted

## 2013-12-13 VITALS — BP 100/66 | HR 90 | Resp 18 | Wt 233.0 lb

## 2013-12-13 DIAGNOSIS — I4891 Unspecified atrial fibrillation: Secondary | ICD-10-CM

## 2013-12-13 MED ORDER — DIGOXIN 125 MCG PO TABS: 0.1250 mg | ORAL_TABLET | Freq: Every day | ORAL | Status: DC

## 2013-12-13 MED ORDER — METOPROLOL TARTRATE 25 MG PO TABS
25.0000 mg | ORAL_TABLET | Freq: Two times a day (BID) | ORAL | Status: DC
Start: 2013-12-13 — End: 2014-12-11

## 2013-12-13 MED ORDER — DIGOXIN 125 MCG PO TABS: ORAL_TABLET | ORAL | Status: DC

## 2013-12-13 NOTE — Progress Notes (Signed)
EKG and BP check today.  Reports feeling "really lousy, no energy".  Otherwise, no new complaints. Dr. Lovena Le reviewed EKG and BP. Pt is to start Digoxin .0.125mg  (2 tabs x 5 days, then 1 tab daily) Decrease metoprolol to 12.5mg  BID on day 5 of digoxin. Return in 2 weeks for blood work and see Dr. Lovena Le. Appointments made.  Calendar written out for patient to manage medication changes. Pt verbalizes understanding of the above. Instructed him to contact office with any questions.  Also--reviewed plan to stop xarelto 48 hr prior to injection, restart 24 hr after.

## 2013-12-13 NOTE — Patient Instructions (Addendum)
Begin taking DIGOXIN 0.125 mg 2 TWO TABLETS ONCE A DAY FOR 5 DAYS, then ONE TABLET daily after that.  On Day 5 of taking Digoxin----DECREASE METOPROLOL TO 12.5 MG TWICE A DAY.  Have blood work (Digoxin level) and follow up with Dr. Lovena Le in 2 weeks.

## 2013-12-26 ENCOUNTER — Encounter: Payer: Self-pay | Admitting: Internal Medicine

## 2013-12-26 ENCOUNTER — Other Ambulatory Visit: Payer: Medicare Other

## 2013-12-26 ENCOUNTER — Ambulatory Visit (INDEPENDENT_AMBULATORY_CARE_PROVIDER_SITE_OTHER): Payer: Medicare Other | Admitting: Internal Medicine

## 2013-12-26 ENCOUNTER — Telehealth: Payer: Self-pay | Admitting: Internal Medicine

## 2013-12-26 ENCOUNTER — Telehealth: Payer: Self-pay | Admitting: Pharmacist

## 2013-12-26 VITALS — BP 114/65 | HR 70 | Ht 74.0 in | Wt 231.0 lb

## 2013-12-26 DIAGNOSIS — I4891 Unspecified atrial fibrillation: Secondary | ICD-10-CM

## 2013-12-26 DIAGNOSIS — I5031 Acute diastolic (congestive) heart failure: Secondary | ICD-10-CM

## 2013-12-26 MED ORDER — RIVAROXABAN 20 MG PO TABS: 20.0000 mg | ORAL_TABLET | Freq: Every day | ORAL | Status: DC

## 2013-12-26 NOTE — Telephone Encounter (Signed)
Since patient held Xarelto for past 2 days for epidural injection today, we will need to wait at least 3 weeks before loading with [name].  Discussed with Dr. Lovena Le.  I explained this to patient and r/s tikosyn visit until 01/22/14.  Will get a digoxin level this day as well prior to load.  To Claiborne Billings in case you guys need to change any of his f/u visits that you already made.

## 2013-12-26 NOTE — Progress Notes (Signed)
HPI [name] returns today for followup. He is a pleasant 73 yo man with a h/o persistent atrial fibrillation, who returns today for followup. The patient has done poorly. He has had very rapid ventricular rates in atrial fibrillation. His blood pressure has been low. He feels poorly. We tried him on higher dose of beta blocker but he felt badly and was hypotensive. We reduced his beta blocker and started digoxin and he was improved but still feels poorly compared to when he was in NSR.  Allergies  Allergen Reactions  . Penicillins Anaphylaxis    Swelling of the mouth     Current Outpatient Prescriptions  Medication Sig Dispense Refill  . digoxin (LANOXIN) 0.125 MG tablet Take 2 tabs a day for 5 days, then take 1 tab daily  90 tablet  3  . IRON, FERROUS GLUCONATE, PO Take by mouth daily.      . metoprolol tartrate (LOPRESSOR) 25 MG tablet Take 1 tablet (25 mg total) by mouth 2 (two) times daily.  180 tablet  3  . Naproxen Sodium 220 MG CAPS Take 1 capsule (220 mg total) by mouth every 8 (eight) hours as needed.      Marland Kitchen omeprazole (PRILOSEC) 20 MG capsule Take 20 mg by mouth every morning.       . Pitavastatin Calcium (LIVALO) 2 MG TABS Take by mouth.      . triamcinolone (NASACORT) 55 MCG/ACT nasal inhaler Place 1 spray into the nose daily.       . Rivaroxaban (XARELTO) 20 MG TABS tablet Take 1 tablet (20 mg total) by mouth daily. THIS WAS STARTED BY DR. Edrick Mitchell PER PT  30 tablet     No current facility-administered medications for this visit.     Past Medical History  Diagnosis Date  . Atrial fibrillation     a. on flecainide and coumadin  . GERD (gastroesophageal reflux disease)   . Hyperlipidemia   . Anemia   . Cancer 2010    prostate  . Atrial flutter     a. s/p TEE/DCCV 09/2012, EF 50-55% by TEE.  . Mild mitral and aortic regurgitation     a. by TEE 09/2012  . AAA (abdominal aortic aneurysm)     Abd U/S 4/14:  Infrarenal AAA 3.2 x 3.1 cm, bilat CIA mildly dilated  with slight increase on L, mild aorto-iliac atherosclerosis w/o stenosis - f/u 1 year    ROS:   All systems reviewed and negative except as noted in the HPI.   Past Surgical History  Procedure Laterality Date  . Back surgery    . Hip surgery    . Cholecystectomy    . Prostatectomy    . Hernia repair    . Total hip arthroplasty  09/12/2012    Procedure: TOTAL HIP ARTHROPLASTY ANTERIOR APPROACH;  Surgeon: Tyrone Pole, MD;  Location: [facility];  Service: Orthopedics;  Laterality: Left;  . Hip replacement  09/12/2012    left  . Tee without cardioversion  10/09/2012    Procedure: TRANSESOPHAGEAL ECHOCARDIOGRAM (TEE);  Surgeon: Tyrone Perla, MD;  Location: [facility] ENDOSCOPY;  Service: Cardiovascular;  Laterality: N/A;  . Cardioversion  10/09/2012    Procedure: CARDIOVERSION;  Surgeon: Tyrone Perla, MD;  Location: [facility] ENDOSCOPY;  Service: Cardiovascular;  Laterality: N/A;     No family history on file.   History   Social History  . Marital Status: Divorced    Spouse Name: N/A  Number of Children: N/A  . Years of Education: N/A   Occupational History  . Not on file.   Social History Main Topics  . Smoking status: Former Smoker    Types: Cigars    Quit date: 08/27/2011  . Smokeless tobacco: Not on file  . Alcohol Use: No  . Drug Use: No  . Sexual Activity: Not on file   Other Topics Concern  . Not on file   Social History Narrative  . No narrative on file     BP 114/65  Pulse 70  Ht 6\' 2"  (1.88 m)  Wt 231 lb (104.781 kg)  BMI 29.65 kg/m2  Physical Exam:  stable appearing 73 yo man, NAD HEENT: Unremarkable Neck:  No JVD, no thyromegally Back:  No CVA tenderness Lungs:  Clear with no wheezes HEART:  irregular rhythm, no murmurs, no rubs, no clicks Abd:  soft, positive bowel sounds, no organomegally, no rebound, no guarding Ext:  2 plus pulses, no edema, no cyanosis, no clubbing Skin:  No rashes no nodules Neuro:  CN II through XII intact, motor grossly  intact  EKG - atrial fibrillation with a controlled VR   Assess/Plan:

## 2013-12-26 NOTE — Assessment & Plan Note (Signed)
His symptoms are class 2B/3A. I think he will be better if we can get him back to NSR. Will schedule tikosyn in the next few weeks. In the interim he is encouraged to maintain a low sodium diet.

## 2013-12-26 NOTE — Assessment & Plan Note (Signed)
The patient has been reluctant to consider [name] or amiodrone. He feels poorly in atrial fib. We discussed rhythm control. I have recommended that the patient start [name]. He has been off of his anti-coagulation and will need to be back on his Xarelto for 3 weeks. Will plan to proceed with [name] admit after this.

## 2013-12-26 NOTE — Telephone Encounter (Signed)
New problem   Pt want to change his appt from Tuesday to Wednesday that is at the hospital. Please call pt concerning this

## 2013-12-26 NOTE — Patient Instructions (Signed)
Will need a [name] Load next Tues  Will get a call from our office as to when to come in on Tues for pre-admission labs

## 2013-12-26 NOTE — Telephone Encounter (Signed)
Message copied by Bishop Limbo on Wed Dec 26, 2013  4:12 PM ------      Message from: Dionicio Stall      Created: Wed Dec 26, 2013 12:30 PM       [name] Load next Tues per Dr Jacolyn Reedy ------

## 2013-12-26 NOTE — Telephone Encounter (Signed)
Tyrone Mitchell, Pharm D has called patient

## 2013-12-27 LAB — DIGOXIN LEVEL: Digoxin Level: 0.8 ng/mL (ref 0.8–2.0)

## 2014-01-16 ENCOUNTER — Encounter: Payer: Self-pay | Admitting: Internal Medicine

## 2014-01-16 ENCOUNTER — Ambulatory Visit (INDEPENDENT_AMBULATORY_CARE_PROVIDER_SITE_OTHER): Payer: Medicare Other | Admitting: Internal Medicine

## 2014-01-16 VITALS — BP 132/78 | HR 83 | Ht 74.0 in | Wt 232.0 lb

## 2014-01-16 DIAGNOSIS — I4891 Unspecified atrial fibrillation: Secondary | ICD-10-CM

## 2014-01-16 DIAGNOSIS — I5031 Acute diastolic (congestive) heart failure: Secondary | ICD-10-CM

## 2014-01-16 NOTE — Progress Notes (Signed)
HPI [name] returns today for followup. He is a pleasant 73 yo man with a h/o persistent atrial fibrillation, who returns today for followup. The patient has done poorly with a trial of rate control. He has class 2B diastolic CHF symptoms. In NSR he was class 1. He has had very rapid ventricular rates in atrial fibrillation. His blood pressure has been on the low side previously. We tried him on higher dose of beta blocker but he felt badly and was hypotensive. We reduced his beta blocker and started digoxin and he was improved but still feels poorly compared to when he was in NSR. He had to stop his anti-coagulation for several days to undergo a spinal injection. He is pending admission for [name].  Allergies  Allergen Reactions  . Penicillins Anaphylaxis    Swelling of the mouth     Current Outpatient Prescriptions  Medication Sig Dispense Refill  . digoxin (LANOXIN) 0.125 MG tablet Take 0.125 mg by mouth daily.      . metoprolol tartrate (LOPRESSOR) 25 MG tablet Take 1 tablet (25 mg total) by mouth 2 (two) times daily.  180 tablet  3  . Naproxen Sodium 220 MG CAPS Take 1 capsule (220 mg total) by mouth every 8 (eight) hours as needed.      . Pitavastatin Calcium (LIVALO) 2 MG TABS Take 1 tablet by mouth daily.       . Rivaroxaban (XARELTO) 20 MG TABS tablet Take 1 tablet (20 mg total) by mouth daily. THIS WAS STARTED BY DR. Edrick Oh PER PT  30 tablet     No current facility-administered medications for this visit.     Past Medical History  Diagnosis Date  . Atrial fibrillation     a. on flecainide and coumadin  . GERD (gastroesophageal reflux disease)   . Hyperlipidemia   . Anemia   . Cancer 2010    prostate  . Atrial flutter     a. s/p TEE/DCCV 09/2012, EF 50-55% by TEE.  . Mild mitral and aortic regurgitation     a. by TEE 09/2012  . AAA (abdominal aortic aneurysm)     Abd U/S 4/14:  Infrarenal AAA 3.2 x 3.1 cm, bilat CIA mildly dilated with slight increase on L,  mild aorto-iliac atherosclerosis w/o stenosis - f/u 1 year    ROS:   All systems reviewed and negative except as noted in the HPI.   Past Surgical History  Procedure Laterality Date  . Back surgery    . Hip surgery    . Cholecystectomy    . Prostatectomy    . Hernia repair    . Total hip arthroplasty  09/12/2012    Procedure: TOTAL HIP ARTHROPLASTY ANTERIOR APPROACH;  Surgeon: Mauri Pole, MD;  Location: [facility] ORS;  Service: Orthopedics;  Laterality: Left;  . Hip replacement  09/12/2012    left  . Tee without cardioversion  10/09/2012    Procedure: TRANSESOPHAGEAL ECHOCARDIOGRAM (TEE);  Surgeon: Lelon Perla, MD;  Location: [facility] ENDOSCOPY;  Service: Cardiovascular;  Laterality: N/A;  . Cardioversion  10/09/2012    Procedure: CARDIOVERSION;  Surgeon: Lelon Perla, MD;  Location: [facility] ENDOSCOPY;  Service: Cardiovascular;  Laterality: N/A;     No family history on file.   History   Social History  . Marital Status: Divorced    Spouse Name: N/A    Number of Children: N/A  . Years of Education: N/A   Occupational History  . Not  on file.   Social History Main Topics  . Smoking status: Former Smoker    Types: Cigars    Quit date: 08/27/2011  . Smokeless tobacco: Not on file  . Alcohol Use: No  . Drug Use: No  . Sexual Activity: Not on file   Other Topics Concern  . Not on file   Social History Narrative  . No narrative on file     BP 132/78  Pulse 83  Ht 6\' 2"  (1.88 m)  Wt 232 lb (105.235 kg)  BMI 29.77 kg/m2  Physical Exam:  stable appearing 73 yo man, NAD HEENT: Unremarkable Neck:  No JVD, no thyromegally Back:  No CVA tenderness Lungs:  Clear with no wheezes HEART:  irregular rhythm, no murmurs, no rubs, no clicks Abd:  soft, positive bowel sounds, no organomegally, no rebound, no guarding Ext:  2 plus pulses, no edema, no cyanosis, no clubbing Skin:  No rashes no nodules Neuro:  CN II through XII intact, motor grossly intact  EKG - atrial  fibrillation with a controlled VR   Assess/Plan:

## 2014-01-16 NOTE — Patient Instructions (Signed)
[name] Load next week----  Tyrone Mitchell will call you

## 2014-01-16 NOTE — Assessment & Plan Note (Signed)
His heart failure has improved from class 3 to class 2B with reasonable rate control. Hopefully he will be able to maintain NSR and improvement in his diastolic CHF.

## 2014-01-16 NOTE — Assessment & Plan Note (Signed)
He has now developed long-term persistent atrial fib. I have recommended her proceed with [name] admission and he has been therapeutically anti-coagulated. Hopefully he will revert to NSR.

## 2014-01-17 ENCOUNTER — Telehealth: Payer: Self-pay | Admitting: Internal Medicine

## 2014-01-17 NOTE — Telephone Encounter (Signed)
New message     Want a second opinion regarding procedure he has scheduled for Tuesday. He is supposed to have a tikosyn load.  His friend saw Dr Caryl Comes for the same problem and pt want to talk to a nurse about what should the pt do?

## 2014-01-17 NOTE — Telephone Encounter (Signed)
Spoke with patient and let him know that Dr Lovena Le and Dr Caryl Comes work very closely together and that it is up to him.  I will do whatever he feels most comfortable with.  He is going to proceed with [name] load and if he changes his mind will call back and cancel

## 2014-01-22 ENCOUNTER — Ambulatory Visit: Payer: Medicare Other | Admitting: Pharmacist

## 2014-04-23 ENCOUNTER — Ambulatory Visit: Payer: Medicare Other | Admitting: Internal Medicine

## 2014-06-21 ENCOUNTER — Telehealth: Payer: Self-pay | Admitting: Internal Medicine

## 2014-06-21 NOTE — Telephone Encounter (Signed)
Called patient and we had a bad connection.  He asked I call him back on his cell phone.  I tried and it went to his voicemail

## 2014-06-21 NOTE — Telephone Encounter (Signed)
Patient is in AFIB and irregular heart beat & he would like for you to give him a call back. Please call and advise.

## 2014-06-24 NOTE — Telephone Encounter (Addendum)
Spoke with patient and he is going to come in on 07/10/14 at 2pm to discuss [name] Load again.   He was trying to get this done through the [place] but that did not work and he is wanting to do it here  He is aware of appointment date and time

## 2014-06-24 NOTE — Telephone Encounter (Signed)
109/59 

## 2014-07-10 ENCOUNTER — Ambulatory Visit (INDEPENDENT_AMBULATORY_CARE_PROVIDER_SITE_OTHER): Payer: Medicare Other | Admitting: Internal Medicine

## 2014-07-10 ENCOUNTER — Encounter: Payer: Self-pay | Admitting: Internal Medicine

## 2014-07-10 VITALS — BP 116/68 | HR 64 | Ht 74.0 in | Wt 224.0 lb

## 2014-07-10 DIAGNOSIS — I4819 Other persistent atrial fibrillation: Secondary | ICD-10-CM

## 2014-07-10 DIAGNOSIS — I10 Essential (primary) hypertension: Secondary | ICD-10-CM

## 2014-07-10 DIAGNOSIS — I4891 Unspecified atrial fibrillation: Secondary | ICD-10-CM

## 2014-07-10 NOTE — Patient Instructions (Signed)
Your physician recommends that you continue on your current medications as directed. Please refer to the Current Medication list given to you today.  Your physician has recommended [name] loading in the hospital.  Please come to our office Monday 10/5 at 9:30 am to discuss [name] with pharmacist Gay Filler).

## 2014-07-10 NOTE — Assessment & Plan Note (Signed)
We discussed the treatment options. He wishes to come in next week for initiation of [name]. His QT today is under 400. He has taken his systemic anti-coagulation without missing any doses.

## 2014-07-10 NOTE — Assessment & Plan Note (Signed)
His blood pressure is well controlled. No change in medications. 

## 2014-07-10 NOTE — Progress Notes (Signed)
HPI [name] returns today for followup. He is a pleasant 73 yo man with a h/o persistent atrial fibrillation, who returns today for followup. He was seen by me several months ago and agreed to come in for [name] initiation. He tried to accomplish this at the [place] but could not be seen. He returns for followup. No chest pain. He feels poorly. No syncope. Allergies  Allergen Reactions  . Penicillins Anaphylaxis    Swelling of the mouth     Current Outpatient Prescriptions  Medication Sig Dispense Refill  . Difluprednate (DUREZOL OP) Apply 1 drop to eye as directed. 1 drop per eye twice a day      . digoxin (LANOXIN) 0.125 MG tablet Take 0.125 mg by mouth daily.      . fenofibrate 160 MG tablet Take 160 mg by mouth daily.      . metoprolol tartrate (LOPRESSOR) 25 MG tablet Take 1 tablet (25 mg total) by mouth 2 (two) times daily.  180 tablet  3  . Naproxen Sodium 220 MG CAPS Take 1 capsule (220 mg total) by mouth every 8 (eight) hours as needed.      . Pitavastatin Calcium (LIVALO) 2 MG TABS Take 1 tablet by mouth daily.       . ranitidine (ZANTAC) 150 MG tablet Take 150 mg by mouth 2 (two) times daily.      . Rivaroxaban (XARELTO) 20 MG TABS tablet Take 1 tablet (20 mg total) by mouth daily. THIS WAS STARTED BY DR. Edrick Oh PER PT  30 tablet    . Triamcinolone Acetonide (NASACORT ALLERGY 24HR NA) Place 1 spray into the nose daily.       No current facility-administered medications for this visit.     Past Medical History  Diagnosis Date  . Atrial fibrillation     a. on flecainide and coumadin  . GERD (gastroesophageal reflux disease)   . Hyperlipidemia   . Anemia   . Cancer 2010    prostate  . Atrial flutter     a. s/p TEE/DCCV 09/2012, EF 50-55% by TEE.  . Mild mitral and aortic regurgitation     a. by TEE 09/2012  . AAA (abdominal aortic aneurysm)     Abd U/S 4/14:  Infrarenal AAA 3.2 x 3.1 cm, bilat CIA mildly dilated with slight increase on L, mild aorto-iliac  atherosclerosis w/o stenosis - f/u 1 year    ROS:   All systems reviewed and negative except as noted in the HPI.   Past Surgical History  Procedure Laterality Date  . Back surgery    . Hip surgery    . Cholecystectomy    . Prostatectomy    . Hernia repair    . Total hip arthroplasty  09/12/2012    Procedure: TOTAL HIP ARTHROPLASTY ANTERIOR APPROACH;  Surgeon: Mauri Pole, MD;  Location: [facility] ORS;  Service: Orthopedics;  Laterality: Left;  . Hip replacement  09/12/2012    left  . Tee without cardioversion  10/09/2012    Procedure: TRANSESOPHAGEAL ECHOCARDIOGRAM (TEE);  Surgeon: Lelon Perla, MD;  Location: [facility] ENDOSCOPY;  Service: Cardiovascular;  Laterality: N/A;  . Cardioversion  10/09/2012    Procedure: CARDIOVERSION;  Surgeon: Lelon Perla, MD;  Location: [facility] ENDOSCOPY;  Service: Cardiovascular;  Laterality: N/A;     History reviewed. No pertinent family history.   History   Social History  . Marital Status: Divorced    Spouse Name: N/A    Number  of Children: N/A  . Years of Education: N/A   Occupational History  . Not on file.   Social History Main Topics  . Smoking status: Former Smoker    Types: Cigars    Quit date: 08/27/2011  . Smokeless tobacco: Not on file  . Alcohol Use: No  . Drug Use: No  . Sexual Activity: Not on file   Other Topics Concern  . Not on file   Social History Narrative  . No narrative on file     BP 116/68  Pulse 64  Ht 6\' 2"  (1.88 m)  Wt 224 lb (101.606 kg)  BMI 28.75 kg/m2  Physical Exam:  stable appearing 73 yo man, NAD HEENT: Unremarkable Neck:  No JVD, no thyromegally Back:  No CVA tenderness Lungs:  Clear with no wheezes HEART:  irregular rhythm, no murmurs, no rubs, no clicks Abd:  soft, positive bowel sounds, no organomegally, no rebound, no guarding Ext:  2 plus pulses, no edema, no cyanosis, no clubbing Skin:  No rashes no nodules Neuro:  CN II through XII intact, motor grossly intact  EKG - atrial  fibrillation with a controlled VR   Assess/Plan:

## 2014-07-15 ENCOUNTER — Ambulatory Visit: Payer: Medicare Other | Admitting: Pharmacist

## 2014-07-15 ENCOUNTER — Encounter (HOSPITAL_COMMUNITY): Payer: Self-pay | Admitting: General Practice

## 2014-07-15 ENCOUNTER — Ambulatory Visit (INDEPENDENT_AMBULATORY_CARE_PROVIDER_SITE_OTHER): Payer: Medicare Other | Admitting: Pharmacist

## 2014-07-15 ENCOUNTER — Inpatient Hospital Stay (HOSPITAL_COMMUNITY)
Admission: AD | Admit: 2014-07-15 | Discharge: 2014-07-18 | DRG: 310 | Disposition: A | Discharge status: Home / Self Care | Payer: Medicare Other | Source: Ambulatory Visit | Attending: Internal Medicine | Admitting: Internal Medicine

## 2014-07-15 DIAGNOSIS — I700 Atherosclerosis of aorta: Secondary | ICD-10-CM | POA: Diagnosis present

## 2014-07-15 DIAGNOSIS — E119 Type 2 diabetes mellitus without complications: Secondary | ICD-10-CM | POA: Diagnosis present

## 2014-07-15 DIAGNOSIS — K219 Gastro-esophageal reflux disease without esophagitis: Secondary | ICD-10-CM | POA: Diagnosis present

## 2014-07-15 DIAGNOSIS — I4891 Unspecified atrial fibrillation: Secondary | ICD-10-CM | POA: Diagnosis present

## 2014-07-15 DIAGNOSIS — I481 Persistent atrial fibrillation: Secondary | ICD-10-CM

## 2014-07-15 DIAGNOSIS — Z79899 Other long term (current) drug therapy: Secondary | ICD-10-CM

## 2014-07-15 DIAGNOSIS — I080 Rheumatic disorders of both mitral and aortic valves: Secondary | ICD-10-CM | POA: Diagnosis present

## 2014-07-15 DIAGNOSIS — I482 Chronic atrial fibrillation: Secondary | ICD-10-CM | POA: Diagnosis present

## 2014-07-15 DIAGNOSIS — D649 Anemia, unspecified: Secondary | ICD-10-CM | POA: Diagnosis present

## 2014-07-15 DIAGNOSIS — I10 Essential (primary) hypertension: Secondary | ICD-10-CM

## 2014-07-15 DIAGNOSIS — E785 Hyperlipidemia, unspecified: Secondary | ICD-10-CM | POA: Diagnosis present

## 2014-07-15 DIAGNOSIS — Z8546 Personal history of malignant neoplasm of prostate: Secondary | ICD-10-CM

## 2014-07-15 DIAGNOSIS — Z791 Long term (current) use of non-steroidal anti-inflammatories (NSAID): Secondary | ICD-10-CM

## 2014-07-15 DIAGNOSIS — I714 Abdominal aortic aneurysm, without rupture: Secondary | ICD-10-CM | POA: Diagnosis present

## 2014-07-15 DIAGNOSIS — I4819 Other persistent atrial fibrillation: Secondary | ICD-10-CM

## 2014-07-15 DIAGNOSIS — Z7901 Long term (current) use of anticoagulants: Secondary | ICD-10-CM

## 2014-07-15 DIAGNOSIS — M1990 Unspecified osteoarthritis, unspecified site: Secondary | ICD-10-CM | POA: Diagnosis present

## 2014-07-15 HISTORY — DX: Other long term (current) drug therapy: Z79.899

## 2014-07-15 HISTORY — DX: Other complications of anesthesia, initial encounter: T88.59XA

## 2014-07-15 HISTORY — DX: Adverse effect of unspecified anesthetic, initial encounter: T41.45XA

## 2014-07-15 HISTORY — DX: Unspecified osteoarthritis, unspecified site: M19.90

## 2014-07-15 HISTORY — DX: Type 2 diabetes mellitus without complications: E11.9

## 2014-07-15 HISTORY — DX: Encounter for therapeutic drug level monitoring: Z51.81

## 2014-07-15 LAB — BASIC METABOLIC PANEL
BUN: 23 mg/dL (ref 6–23)
CALCIUM: 9.6 mg/dL (ref 8.4–10.5)
CO2: 26 meq/L (ref 19–32)
Chloride: 100 mEq/L (ref 96–112)
Creat: 1.19 mg/dL (ref 0.50–1.35)
Glucose, Bld: 281 mg/dL — ABNORMAL HIGH (ref 70–99)
Potassium: 4.6 mEq/L (ref 3.5–5.3)
SODIUM: 138 meq/L (ref 135–145)

## 2014-07-15 LAB — DIGOXIN LEVEL: DIGOXIN LVL: 0.86 ng/mL (ref 0.8–2.0)

## 2014-07-15 LAB — GLUCOSE, CAPILLARY
GLUCOSE-CAPILLARY: 135 mg/dL — AB (ref 70–99)
Glucose-Capillary: 203 mg/dL — ABNORMAL HIGH (ref 70–99)

## 2014-07-15 LAB — MAGNESIUM: MAGNESIUM: 2 mg/dL (ref 1.5–2.5)

## 2014-07-15 MED ORDER — FENOFIBRATE 160 MG PO TABS
160.0000 mg | ORAL_TABLET | Freq: Every day | ORAL | Status: DC
  Administered 2014-07-16 – 2014-07-18 (×3): 160 mg via ORAL
  Filled 2014-07-15 (×3): qty 1

## 2014-07-15 MED ORDER — FAMOTIDINE 20 MG PO TABS
20.0000 mg | ORAL_TABLET | Freq: Two times a day (BID) | ORAL | Status: DC
  Administered 2014-07-15 – 2014-07-18 (×6): 20 mg via ORAL
  Filled 2014-07-15 (×7): qty 1

## 2014-07-15 MED ORDER — DIGOXIN 125 MCG PO TABS
0.1250 mg | ORAL_TABLET | Freq: Every day | ORAL | Status: DC
  Filled 2014-07-15: qty 1

## 2014-07-15 MED ORDER — DIFLUPREDNATE 0.05 % OP EMUL
1.0000 [drp] | Freq: Two times a day (BID) | OPHTHALMIC | Status: DC
  Filled 2014-07-15: qty 5

## 2014-07-15 MED ORDER — NAPROXEN SODIUM 220 MG PO CAPS: 220.0000 mg | ORAL_CAPSULE | Freq: Two times a day (BID) | ORAL | Status: DC

## 2014-07-15 MED ORDER — METFORMIN HCL ER 750 MG PO TB24
750.0000 mg | ORAL_TABLET | Freq: Every day | ORAL | Status: DC
  Administered 2014-07-16: 750 mg via ORAL
  Filled 2014-07-15 (×2): qty 1

## 2014-07-15 MED ORDER — NON FORMULARY: 1.0000 [drp] | Freq: Two times a day (BID) | Status: DC

## 2014-07-15 MED ORDER — SODIUM CHLORIDE 0.9 % IJ SOLN
3.0000 mL | Freq: Two times a day (BID) | INTRAMUSCULAR | Status: DC
  Administered 2014-07-15 – 2014-07-17 (×4): 3 mL via INTRAVENOUS

## 2014-07-15 MED ORDER — PRAVASTATIN SODIUM 40 MG PO TABS
40.0000 mg | ORAL_TABLET | Freq: Every day | ORAL | Status: DC
  Administered 2014-07-16 – 2014-07-17 (×2): 40 mg via ORAL
  Filled 2014-07-15 (×3): qty 1

## 2014-07-15 MED ORDER — METOPROLOL TARTRATE 25 MG PO TABS
25.0000 mg | ORAL_TABLET | Freq: Two times a day (BID) | ORAL | Status: DC
  Administered 2014-07-15 – 2014-07-18 (×6): 25 mg via ORAL
  Filled 2014-07-15 (×7): qty 1

## 2014-07-15 MED ORDER — SODIUM CHLORIDE 0.9 % IV SOLN: 250.0000 mL | INTRAVENOUS | Status: DC | PRN

## 2014-07-15 MED ORDER — RIVAROXABAN 20 MG PO TABS
20.0000 mg | ORAL_TABLET | Freq: Every day | ORAL | Status: DC
  Administered 2014-07-15 – 2014-07-17 (×3): 20 mg via ORAL
  Filled 2014-07-15 (×4): qty 1

## 2014-07-15 MED ORDER — DOFETILIDE 500 MCG PO CAPS
500.0000 ug | ORAL_CAPSULE | Freq: Two times a day (BID) | ORAL | Status: DC
  Administered 2014-07-15 – 2014-07-17 (×4): 500 ug via ORAL
  Filled 2014-07-15 (×7): qty 1

## 2014-07-15 MED ORDER — SODIUM CHLORIDE 0.9 % IJ SOLN: 3.0000 mL | INTRAMUSCULAR | Status: DC | PRN

## 2014-07-15 MED ORDER — TRIAMCINOLONE ACETONIDE 55 MCG/ACT NA AERO
1.0000 | INHALATION_SPRAY | Freq: Every day | NASAL | Status: DC
  Administered 2014-07-16 – 2014-07-17 (×2): 1 via NASAL
  Filled 2014-07-15: qty 10.8

## 2014-07-15 MED ORDER — DIFLUPREDNATE 0.05 % OP EMUL: 1.0000 [drp] | Freq: Two times a day (BID) | OPHTHALMIC | Status: DC

## 2014-07-15 MED ORDER — DIFLUPREDNATE 0.05 % OP EMUL
1.0000 [drp] | Freq: Two times a day (BID) | OPHTHALMIC | Status: DC
  Administered 2014-07-15 – 2014-07-17 (×5): 1 [drp] via OPHTHALMIC

## 2014-07-15 NOTE — Progress Notes (Signed)
Pharmacy Consult for Dofetilide ([name]) Initiation  Admit Complaint: 73 y.o. male admitted 07/15/2014 with atrial fibrillation to be initiated on dofetilide.   Assessment:  Patient Exclusion Criteria: If any screening criteria checked as "Yes", then  patient  should NOT receive dofetilide until criteria item is corrected. If "Yes" please indicate correction plan.  YES  NO Patient  Exclusion Criteria Correction Plan  []  [x]  Baseline QTc interval is greater than or equal to 440 msec. IF above YES box checked dofetilide contraindicated unless patient has ICD; then may proceed if QTc 500-550 msec or with known ventricular conduction abnormalities may proceed with QTc 550-600 msec. QTc =   QTC 415 per PA note  []  [x]  Magnesium level is less than 1.8 mEq/l : Last magnesium:  Lab Results  Component Value Date   MG 2.0 07/15/2014         []  [x]  Potassium level is less than 4 mEq/l : Last potassium:  Lab Results  Component Value Date   K 4.6 07/15/2014         []  [x]  Patient is known or suspected to have a digoxin level greater than 2 ng/ml: Lab Results  Component Value Date   DIGOXIN 0.86 07/15/2014      []  [x]  Creatinine clearance less than 20 ml/min (calculated using Cockcroft-Gault, actual body weight and serum creatinine): Estimated Creatinine Clearance: 71.7 ml/min (by C-G formula based on Cr of 1.19).    []  [x]  Patient has received drugs known to prolong the QT intervals within the last 48 hours(phenothiazines, tricyclics or tetracyclic antidepressants, erythromycin, H-1 antihistamines, cisapride, fluoroquinolones, azithromycin). Drugs not listed above may have an, as yet, undetected potential to prolong the QT interval, updated information on QT prolonging agents is available at this website:QT prolonging agents   []  [x]  Patient received a dose of hydrochlorothiazide (Oretic) alone or in any combination including triamterene (Dyazide, Maxzide) in the last 48 hours.   []  [x]  Patient  received a medication known to increase dofetilide plasma concentrations prior to initial dofetilide dose:    Trimethoprim (Primsol, Proloprim) in the last 36 hours   Verapamil (Calan, Verelan) in the last 36 hours or a sustained release dose in the last 72 hours   Megestrol (Megace) in the last 5 days    Cimetidine (Tagamet) in the last 6 hours   Ketoconazole (Nizoral) in the last 24 hours   Itraconazole (Sporanox) in the last 48 hours    Prochlorperazine (Compazine) in the last 36 hours    []  [x]  Patient is known to have a history of torsades de pointes; congenital or acquired long QT syndromes.   []  [x]  Patient has received a Class 1 antiarrhythmic with less than 2 half-lives since last dose. (Disopyramide, Quinidine, Procainamide, Lidocaine, Mexiletine, Flecainide, Propafenone)   []  [x]  Patient has received amiodarone therapy in the past 3 months or amiodarone level is greater than 0.3 ng/ml.    Patient has been appropriately anticoagulated with Xarelto.  Ordering provider was confirmed at [email or web address] if they are not listed on the [place] Prescribers list.  Goal of Therapy:  Follow renal function, electrolytes, potential drug interactions, and dose adjustment. Provide education and 1 week supply at discharge.  Plan:  1.  Initiate dofetilide based on renal function: [employer] [facility] Calculated CrCl  Dose q12h  [x]  > 60 ml/min 500 mcg  []  40-60 ml/min 250 mcg  []  20-40 ml/min 125 mcg   2. Follow up QTc after the first 5 doses, renal function, electrolytes (  K & Mg) daily x 3     days, dose adjustment, success of initiation and facilitate 1 week discharge supply as     clinically indicated.  3. Initiate [name] education video (Call [phone] and ask for video # [phone]).  4. Place Enrollment Form on the chart for discharge supply of dofetilide.   Pat Patrick 6:09 PM 07/15/2014

## 2014-07-15 NOTE — Assessment & Plan Note (Signed)
Reviewed pt's labs.  K- 4.6, Mg- 2.0, dig level- 0.6, SCr- 1.19, CrCl- 62mL/min.  Based on labs and EKG, pt okay to start [name].  Would recommend 576mcg BID based on CrCl.  Pt aware to report to hospital for admission

## 2014-07-15 NOTE — Progress Notes (Signed)
Patient Name: Tyrone Mitchell Date of Encounter: 07/15/2014  Principal Problem:   Atrial fibrillation with controlled ventricular response Active Problems:   Chronic anticoagulation    Patient Profile: 73 yo male w/ hx atrial fib, on Xarelto. Seen by GT in clinic 09/30 and [place] admission scheduled.  SUBJECTIVE: No chest pain or SOB, no bleeding issues or problems with Xarelto. No doses missed. Has not taken Flecainide recently.   OBJECTIVE Filed Vitals:   07/15/14 1500 07/15/14 1642  BP:  122/70  Pulse:  62  Temp:  97.9 F (36.6 C)  TempSrc:  Oral  Resp:  18  Height: 6\' 2"  (1.88 m)   Weight: 218 lb 8 oz (99.111 kg)   SpO2:  99%   No intake or output data in the 24 hours ending 07/15/14 1725 Filed Weights   07/15/14 1500  Weight: 218 lb 8 oz (99.111 kg)    PHYSICAL EXAM General: Well developed, well nourished, male in no acute distress. Head: Normocephalic, atraumatic.  Neck: Supple without bruits, JVD not elevated. Lungs:  Resp regular and unlabored, CTA. Heart: slightly irregular R&R, S1, S2, no S3, S4, or murmur; no rub. Abdomen: Soft, non-tender, non-distended, BS + x 4.  Extremities: No clubbing, cyanosis, no edema.  Neuro: Alert and oriented X 3. Moves all extremities spontaneously. Psych: Normal affect.  LABS: Basic Metabolic Panel:  Recent Labs  07/15/14 1058  NA 138  K 4.6  CL 100  CO2 26  GLUCOSE 281*  BUN 23  CREATININE 1.19  CALCIUM 9.6  MG 2.0   Lab Results  Component Value Date   DIGOXIN 0.86 07/15/2014    ECG:  Atrial fib, controlled VR, QTc 415 ms  Current Medications:  Prior to Admission medications   Medication Sig Start Date End Date Taking? Authorizing Provider  Difluprednate (DUREZOL OP) 1 drop per eye twice a day    Historical Provider, MD  digoxin (LANOXIN) 0.125 MG tablet Take 0.125 mg by mouth daily. 12/13/13   Evans Lance, MD  fenofibrate 160 MG tablet Take 160 mg by mouth daily.    Historical Provider, MD    metFORMIN (GLUCOPHAGE-XR) 750 MG 24 hr tablet Take 750 mg by mouth daily with breakfast.    Historical Provider, MD  metoprolol tartrate (LOPRESSOR) 25 MG tablet Take 1 tablet (25 mg total) by mouth 2 (two) times daily. 12/13/13   Evans Lance, MD  Naproxen Sodium 220 MG CAPS Take 220 mg by mouth 2 (two) times daily. 01/16/13   Deboraha Sprang, MD  Pitavastatin Calcium (LIVALO) 2 MG TABS Take 1 tablet by mouth daily.     Historical Provider, MD  ranitidine (ZANTAC) 150 MG tablet Take 150 mg by mouth 2 (two) times daily.    Historical Provider, MD  rivaroxaban (XARELTO) 20 MG TABS tablet Take 20 mg by mouth daily. 12/26/13   Evans Lance, MD  Triamcinolone Acetonide (NASACORT ALLERGY 24HR NA) Place 1 spray into the nose daily.    Historical Provider, MD   ASSESSMENT AND PLAN: Principal Problem:   Atrial fibrillation with controlled ventricular response - for [name] load, possible DCCV prior to d/c.   Active Problems:   Chronic anticoagulation - continue Xarelto    Diabetes - continue home rx plus SSI   Signed, Rosaria Ferries , PA-C 5:25 PM 07/15/2014  EP Attending  Patient seen and examined. Agree with above history, physical exam, assessment and plan. Will admit for initiation of Tiksoyn. Will follow  ECG's carefully, watch electrolytes and continue Xarelto.  Mikle [email or web address].D.

## 2014-07-15 NOTE — Progress Notes (Signed)
  HPI Tyrone Mitchell is a pleasant 73 yo man with a h/o persistent atrial fibrillation referred for [name] initiation.  He is followed by Dr. Lovena Le.  He is currently on metoprolol and digoxin.  He has tried flecainide in the past but this was stopped in February because of poor ventricular control.   Dr. Lovena Le discussed option of rate versus rhythm control at that time and pt chose to start metoprolol.  His dose titration was limited due to hypotension and he remained in symptomatic afib.  At his visit in March, pt agreed to anticoagulation with Xarelto and to start [name] 3 weeks later at the [place].  Unfortunately the [place] did not start the [place] and he continues to be in atrial fibrillation.  At his visit on 9/30, he decided to start [name].   Reviewed pt's medication list.  He is currently not taking any QTc prolongating or contraindicated medications.  He has been appropriately anticoagulated with Xarelto.  Discussed potential side effects with [name] including QTc prolongation.  He is aware to call the office if he misses more than 2 doses in a row.  Pt does get his medications from the [place].  Once he has started [name], we will work on getting it for him there, but in the interim, he does have a part D plan.  His PCP at the [facility] is Dr. Francia Greaves [phone] ext [phone])  EKG today showed Afib with vent rate of 83 bpm.  QTc 446msec.   Allergies  Allergen Reactions  . Penicillins Anaphylaxis    Swelling of the mouth     Current Outpatient Prescriptions  Medication Sig Dispense Refill  . Difluprednate (DUREZOL OP) 1 drop per eye twice a day      . digoxin (LANOXIN) 0.125 MG tablet Take 0.125 mg by mouth daily.      . fenofibrate 160 MG tablet Take 160 mg by mouth daily.      . metFORMIN (GLUCOPHAGE-XR) 750 MG 24 hr tablet Take 750 mg by mouth daily with breakfast.      . metoprolol tartrate (LOPRESSOR) 25 MG tablet Take 1 tablet (25 mg total) by mouth 2 (two) times daily.  180 tablet  3  . Naproxen  Sodium 220 MG CAPS Take 220 mg by mouth 2 (two) times daily.      . Pitavastatin Calcium (LIVALO) 2 MG TABS Take 1 tablet by mouth daily.       . ranitidine (ZANTAC) 150 MG tablet Take 150 mg by mouth 2 (two) times daily.      . rivaroxaban (XARELTO) 20 MG TABS tablet Take 20 mg by mouth daily.      . Triamcinolone Acetonide (NASACORT ALLERGY 24HR NA) Place 1 spray into the nose daily.       No current facility-administered medications for this visit.

## 2014-07-16 ENCOUNTER — Other Ambulatory Visit: Payer: Self-pay

## 2014-07-16 LAB — GLUCOSE, CAPILLARY
GLUCOSE-CAPILLARY: 173 mg/dL — AB (ref 70–99)
GLUCOSE-CAPILLARY: 172 mg/dL — AB (ref 70–99)
Glucose-Capillary: 168 mg/dL — ABNORMAL HIGH (ref 70–99)
Glucose-Capillary: 175 mg/dL — ABNORMAL HIGH (ref 70–99)

## 2014-07-16 LAB — CBC
HEMATOCRIT: 42.4 % (ref 39.0–52.0)
HEMOGLOBIN: 14 g/dL (ref 13.0–17.0)
MCH: 30.4 pg (ref 26.0–34.0)
MCHC: 33 g/dL (ref 30.0–36.0)
MCV: 92 fL (ref 78.0–100.0)
Platelets: 173 10*3/uL (ref 150–400)
RBC: 4.61 MIL/uL (ref 4.22–5.81)
RDW: 14.1 % (ref 11.5–15.5)
WBC: 5.3 10*3/uL (ref 4.0–10.5)

## 2014-07-16 LAB — BASIC METABOLIC PANEL
Anion gap: 10 (ref 5–15)
BUN: 18 mg/dL (ref 6–23)
CHLORIDE: 102 meq/L (ref 96–112)
CO2: 27 mEq/L (ref 19–32)
Calcium: 9.3 mg/dL (ref 8.4–10.5)
Creatinine, Ser: 1.2 mg/dL (ref 0.50–1.35)
GFR calc Af Amer: 68 mL/min — ABNORMAL LOW (ref >90)
GFR calc non Af Amer: 59 mL/min — ABNORMAL LOW (ref >90)
GLUCOSE: 157 mg/dL — AB (ref 70–99)
POTASSIUM: 4.4 meq/L (ref 3.7–5.3)
SODIUM: 139 meq/L (ref 137–147)

## 2014-07-16 LAB — MAGNESIUM: Magnesium: 1.9 mg/dL (ref 1.5–2.5)

## 2014-07-16 MED ORDER — INSULIN ASPART 100 UNIT/ML ~~LOC~~ SOLN
0.0000 [IU] | Freq: Three times a day (TID) | SUBCUTANEOUS | Status: DC
  Administered 2014-07-16 – 2014-07-17 (×3): 3 [IU] via SUBCUTANEOUS
  Administered 2014-07-17: 5 [IU] via SUBCUTANEOUS
  Administered 2014-07-18: 3 [IU] via SUBCUTANEOUS

## 2014-07-16 NOTE — Progress Notes (Signed)
UR Completed Cali Hope Graves-Bigelow, RN,BSN [phone]  

## 2014-07-16 NOTE — Progress Notes (Signed)
Cardiologist on call notified of pt's qrs being <120 post his 1st dose of tikosyn. No new orders given at this time. Will continue to monitor the pt. Pt's next dose isn't due until 0900. Hoover Brunette, RN

## 2014-07-16 NOTE — Care Management Note (Signed)
    Page 1 of 2   07/17/2014     10:42:53 AM CARE MANAGEMENT NOTE 07/17/2014  Patient:  Tyrone Mitchell, Tyrone Mitchell   Account Number:  [id number]  Date Initiated:  07/16/2014  Documentation initiated by:  GRAVES-BIGELOW,Cherolyn Behrle  Subjective/Objective Assessment:   Pt admitted for  Atrial fibrillation with controlled ventricular response- plan for [name] Load.     Action/Plan:   Pt uses [facility] and they are not registered to dispense [name]. CM did call the [facility] and medication can be ordered. Benefits check in process, will make pt aware once completed.   Anticipated DC Date:  07/19/2014   Anticipated DC Plan:  [place]  CM consult  Medication Assistance      Choice offered to / List presented to:             Status of service:  Completed, signed off Medicare Important Message given?   (If response is "NO", the following Medicare IM given date fields will be blank) Date Medicare IM given:   Medicare IM given by:   Date Additional Medicare IM given:   Additional Medicare IM given by:    Discharge Disposition:  HOME/SELF CARE  Per UR Regulation:  Reviewed for med. necessity/level of care/duration of stay  If discussed at Long Length of Stay Meetings, dates discussed:    Comments:  [facility]: [address] [place], [place] [address] [phone]  07-17-14 [address], [place] [phone] CM was able to speak to [place] with [facility]. [facility] will [facility] once [facility] has [name] dosage. CM did make pt aware that he will need to use the [facility] [place], [place], [place] [address] [phone] for first [name] Dose. Pt will need Rx for 7 day supply no refills and the original Rx with refills. No further needs from CM at this time.   07-16-14 [place] CM did speak to pt in ref to price and he states he gets some medications via the  [place] center. CM did call to see if [name] could be obtained via [place]. CM received the number to [employer] CSW at the [place] # [phone]. ext [phone]. CM was able to leave VM for CSW to call CM with information in regards to [name].  07-16-14 [address] [place]  [phone] PT COPAY will be $95- PRIOR AUTH IS NOT REQUIRED- will make pt aware.  07-16-14 [place], [place] [phone] Pt will need a Rx for 7 day supply no refills and the original Rx with refills sent to [facility].

## 2014-07-16 NOTE — Progress Notes (Signed)
    SUBJECTIVE: The patient is doing well today.  At this time, he denies chest pain, shortness of breath, or any new concerns.  Admitted 07-15-2014 for [name] loading.   Labs reviewed.  QTc manually 414msec  CURRENT MEDICATIONS: . Difluprednate  1 drop Both Eyes BID  . dofetilide  500 mcg Oral BID  . famotidine  20 mg Oral BID  . fenofibrate  160 mg Oral Daily  . metFORMIN  750 mg Oral Q breakfast  . metoprolol tartrate  25 mg Oral BID  . pravastatin  40 mg Oral q1800  . rivaroxaban  20 mg Oral Daily  . sodium chloride  3 mL Intravenous Q12H  . triamcinolone  1 spray Each Nare Daily      OBJECTIVE: Physical Exam: Filed Vitals:   07/15/14 1642 07/15/14 2100 07/15/14 2243 07/16/14 0500  BP: 122/70 119/77 121/68 114/73  Pulse: 62 50 89 97  Temp: 97.9 F (36.6 C) 98.2 F (36.8 C)  97.7 F (36.5 C)  TempSrc: Oral     Resp: 18 16  12   Height:      Weight:    218 lb 11.2 oz (99.202 kg)  SpO2: 99% 96%  96%    Intake/Output Summary (Last 24 hours) at 07/16/14 7824 Last data filed at 07/15/14 2244  Gross per 24 hour  Intake    363 ml  Output      0 ml  Net    363 ml    Telemetry reveals atrial fibrillation, rates 70-90's, occasional 2 second pauses  GEN- The patient is well appearing, alert and oriented x 3 today.   Head- normocephalic, atraumatic Eyes-  Sclera clear, conjunctiva pink Ears- hearing intact Neck- supple, no JVP Lymph- no cervical lymphadenopathy Lungs- Clear to ausculation bilaterally, normal work of breathing Heart- IRegular rate and rhythm, no murmurs, rubs or gallops, PMI not laterally displaced GI- soft, NT, ND, + BS Extremities- no clubbing, cyanosis, or edema Skin- no rash or lesion Psych- euthymic mood, full affect Neuro- strength and sensation are intact  LABS: Basic Metabolic Panel:  Recent Labs  07/15/14 1058 07/16/14 0502  NA 138 139  K 4.6 4.4  CL 100 102  CO2 26 27  GLUCOSE 281* 157*  BUN 23 18  CREATININE 1.19 1.20    CALCIUM 9.6 9.3  MG 2.0 1.9   CBC:  Recent Labs  07/16/14 0502  WBC 5.3  HGB 14.0  HCT 42.4  MCV 92.0  PLT 173    ASSESSMENT AND PLAN:  Principal Problem:   Atrial fibrillation with controlled ventricular response Active Problems:   Chronic anticoagulation   Atrial fibrillation, controlled Rec: He will continue Dofetilide 500 mcg twice daily. If QT interval prlongs further, we will need to back off on his dose. Will plan DCCV tomorrow if he does not revert to NSR.  Mikle Bosworth.D.

## 2014-07-16 NOTE — Progress Notes (Signed)
Pt QRS 92, Amber PA notified and stated Dr. Lovena Le is aware and to continue with [name].  Will continue to monitor.

## 2014-07-16 NOTE — Progress Notes (Signed)
Inpatient Diabetes Program Recommendations  AACE/ADA: New Consensus Statement on Inpatient Glycemic Control (2013)  Target Ranges:  Prepandial:   less than 140 mg/dL      Peak postprandial:   less than 180 mg/dL (1-2 hours)      Critically ill patients:  140 - 180 mg/dL     Results for Tyrone Mitchell, Tyrone Mitchell (MRN [id number]) as of 07/16/2014 07:15  Ref. Range 07/15/2014 17:01 07/15/2014 19:53  Glucose-Capillary Latest Range: 70-99 mg/dL 135 (H) 203 (H)     Patient currently getting home dose of Metformin.    MD- Please start Novolog Sensitive SSI tid ac + HS while patient in hospital     Will follow Wyn Quaker RN, MSN, CDE Diabetes Coordinator Inpatient Diabetes Program Team Pager: [phone] (8a-10p)

## 2014-07-17 ENCOUNTER — Encounter (HOSPITAL_COMMUNITY)
Admission: AD | Disposition: A | Discharge status: Home / Self Care | Payer: Self-pay | Source: Ambulatory Visit | Attending: Internal Medicine

## 2014-07-17 DIAGNOSIS — I4891 Unspecified atrial fibrillation: Secondary | ICD-10-CM

## 2014-07-17 HISTORY — PX: CARDIOVERSION: SHX1299

## 2014-07-17 LAB — GLUCOSE, CAPILLARY
GLUCOSE-CAPILLARY: 183 mg/dL — AB (ref 70–99)
GLUCOSE-CAPILLARY: 151 mg/dL — AB (ref 70–99)
Glucose-Capillary: 220 mg/dL — ABNORMAL HIGH (ref 70–99)
Glucose-Capillary: 199 mg/dL — ABNORMAL HIGH (ref 70–99)

## 2014-07-17 LAB — BASIC METABOLIC PANEL
Anion gap: 12 (ref 5–15)
BUN: 20 mg/dL (ref 6–23)
CHLORIDE: 99 meq/L (ref 96–112)
CO2: 27 mEq/L (ref 19–32)
Calcium: 9.4 mg/dL (ref 8.4–10.5)
Creatinine, Ser: 1.37 mg/dL — ABNORMAL HIGH (ref 0.50–1.35)
GFR calc Af Amer: 58 mL/min — ABNORMAL LOW (ref >90)
GFR calc non Af Amer: 50 mL/min — ABNORMAL LOW (ref >90)
GLUCOSE: 165 mg/dL — AB (ref 70–99)
POTASSIUM: 4.6 meq/L (ref 3.7–5.3)
SODIUM: 138 meq/L (ref 137–147)

## 2014-07-17 LAB — MAGNESIUM: Magnesium: 2.1 mg/dL (ref 1.5–2.5)

## 2014-07-17 SURGERY — CARDIOVERSION
Anesthesia: LOCAL

## 2014-07-17 MED ORDER — FENTANYL CITRATE 0.05 MG/ML IJ SOLN
INTRAMUSCULAR | Status: AC
  Filled 2014-07-17: qty 2

## 2014-07-17 MED ORDER — SODIUM CHLORIDE 0.9 % IV SOLN: 250.0000 mL | INTRAVENOUS | Status: DC

## 2014-07-17 MED ORDER — MIDAZOLAM HCL 5 MG/5ML IJ SOLN
INTRAMUSCULAR | Status: AC
  Filled 2014-07-17: qty 5

## 2014-07-17 MED ORDER — ACETAMINOPHEN 325 MG PO TABS
650.0000 mg | ORAL_TABLET | Freq: Four times a day (QID) | ORAL | Status: DC | PRN
  Administered 2014-07-17: 650 mg via ORAL
  Filled 2014-07-17: qty 2

## 2014-07-17 MED ORDER — SODIUM CHLORIDE 0.9 % IJ SOLN: 3.0000 mL | INTRAMUSCULAR | Status: DC | PRN

## 2014-07-17 MED ORDER — SODIUM CHLORIDE 0.9 % IJ SOLN
3.0000 mL | Freq: Two times a day (BID) | INTRAMUSCULAR | Status: DC
  Administered 2014-07-17: 3 mL via INTRAVENOUS

## 2014-07-17 NOTE — CV Procedure (Signed)
Preop Dx afib  Post op DX  NSR>>AFib   Procedure  DC Cardioversion   Pt was sedated by anesthesia receiving 5 mg versed and 100 mcg fentanyl  A synchronized shock 200 joules restored sinus Rhythm* But he reverted to atrial fib   Pt tolerated without difficulty   We sedated him further   A synchronized shock 200 joules failed to restore sinus so received 300 Joules which  restored sinus Rhythm    Pt tolerated without difficulty

## 2014-07-17 NOTE — Progress Notes (Signed)
       Patient Name: Tyrone Mitchell      SUBJECTIVE: without complaints of CP or COB  Past Medical History  Diagnosis Date  . Atrial fibrillation     a. on flecainide and coumadin  . GERD (gastroesophageal reflux disease)   . Hyperlipidemia   . Anemia   . Cancer 2010    prostate  . Atrial flutter     a. s/p TEE/DCCV 09/2012, EF 50-55% by TEE.  . Mild mitral and aortic regurgitation     a. by TEE 09/2012  . AAA (abdominal aortic aneurysm)     Abd U/S 4/14:  Infrarenal AAA 3.2 x 3.1 cm, bilat CIA mildly dilated with slight increase on L, mild aorto-iliac atherosclerosis w/o stenosis - f/u 1 year  . Complication of anesthesia     HAD DIFFICULTY WAKING AS CHILD  . Diabetes mellitus without complication   . Arthritis   . Visit for monitoring [name] therapy     Scheduled Meds:  Scheduled Meds: . Difluprednate  1 drop Both Eyes BID  . dofetilide  500 mcg Oral BID  . famotidine  20 mg Oral BID  . fenofibrate  160 mg Oral Daily  . insulin aspart  0-15 Units Subcutaneous TID WC  . metoprolol tartrate  25 mg Oral BID  . pravastatin  40 mg Oral q1800  . rivaroxaban  20 mg Oral Daily  . sodium chloride  3 mL Intravenous Q12H  . triamcinolone  1 spray Each Nare Daily   Continuous Infusions:  sodium chloride, sodium chloride    PHYSICAL EXAM Filed Vitals:   07/16/14 0500 07/16/14 1359 07/16/14 2109 07/17/14 0521  BP: 114/73 136/70 144/93 126/66  Pulse: 97 103 101 98  Temp: 97.7 F (36.5 C) 97.7 F (36.5 C) 97.7 F (36.5 C) 98.1 F (36.7 C)  TempSrc:  Oral Oral Oral  Resp: 12 15 16 16   Height:      Weight: 218 lb 11.2 oz (99.202 kg)   210 lb 8 oz (95.482 kg)  SpO2: 96% 97% 96% 96%    Well developed and nourished in no acute distress HENT normal Neck supple with JVP-flat Carotids brisk and full without bruits Clear Irregularly irregular rate and rhythm with controlled ventricular response, no murmurs or gallops Abd-soft with active BS without  hepatomegaly No Clubbing cyanosis edema Skin-warm and dry A & Oriented  Grossly normal sensory and motor function   TELEMETRY: Reviewed telemetry pt in afib    No intake or output data in the 24 hours ending 07/17/14 1308  LABS: Basic Metabolic Panel:  Recent Labs Lab 07/15/14 1058 07/16/14 0502 07/17/14 0455  NA 138 139 138  K 4.6 4.4 4.6  CL 100 102 99  CO2 26 27 27   GLUCOSE 281* 157* 165*  BUN 23 18 20   CREATININE 1.19 1.20 1.37*  CALCIUM 9.6 9.3 9.4  MG 2.0 1.9 2.1   Cardiac Enzymes: No results found for this basename: CKTOTAL, CKMB, CKMBINDEX, TROPONINI,  in the last 72 hours CBC:  Recent Labs Lab 07/16/14 0502  WBC 5.3  HGB 14.0  HCT 42.4  MCV 92.0  PLT 173      ASSESSMENT AND PLAN:  Principal Problem:   Atrial fibrillation with controlled ventricular response Active Problems:   Chronic anticoagulation   Atrial fibrillation, controlled  For cardioversion  Signed, Virl Axe MD  07/17/2014

## 2014-07-17 NOTE — Progress Notes (Signed)
Pt QRS 88, Dr. Lovena Le on unit and notified of QRS and he stated to continue giving [name]. Will continue to monitor.

## 2014-07-17 NOTE — Progress Notes (Signed)
Spoke with GT  Will stop dofetilide and he will consider amio vs rate control with patient in the am

## 2014-07-17 NOTE — Progress Notes (Signed)
Pt cardioverted successfully but reverted to atrial fib both times  Will need to discuss with GT next step  amio vs ..Marland Kitchen

## 2014-07-17 NOTE — Progress Notes (Signed)
IV saline locked. Patient remains sedated.

## 2014-07-18 LAB — MAGNESIUM: Magnesium: 2.1 mg/dL (ref 1.5–2.5)

## 2014-07-18 LAB — BASIC METABOLIC PANEL
ANION GAP: 14 (ref 5–15)
BUN: 23 mg/dL (ref 6–23)
CHLORIDE: 99 meq/L (ref 96–112)
CO2: 26 mEq/L (ref 19–32)
Calcium: 9.2 mg/dL (ref 8.4–10.5)
Creatinine, Ser: 1.36 mg/dL — ABNORMAL HIGH (ref 0.50–1.35)
GFR calc non Af Amer: 51 mL/min — ABNORMAL LOW (ref >90)
GFR, EST AFRICAN AMERICAN: 59 mL/min — AB (ref >90)
Glucose, Bld: 145 mg/dL — ABNORMAL HIGH (ref 70–99)
Potassium: 4.5 mEq/L (ref 3.7–5.3)
Sodium: 139 mEq/L (ref 137–147)

## 2014-07-18 LAB — GLUCOSE, CAPILLARY: Glucose-Capillary: 158 mg/dL — ABNORMAL HIGH (ref 70–99)

## 2014-07-18 MED ORDER — AMIODARONE HCL 200 MG PO TABS: 200.0000 mg | ORAL_TABLET | Freq: Two times a day (BID) | ORAL | Status: DC

## 2014-07-18 NOTE — Progress Notes (Signed)
Patient ID: Tyrone Mitchell, male   DOB: Mar 11, 1941, 73 y.o.   MRN: [id number]    SUBJECTIVE: The patient is stable today after failed DCCV.  At this time, he denies chest pain or shortness of breath.  Admitted 07-15-2014 for [name] loading. DCCV 10/7, unsuccessful (ERAF)  Labs reviewed.   CURRENT MEDICATIONS: . Difluprednate  1 drop Both Eyes BID  . famotidine  20 mg Oral BID  . fenofibrate  160 mg Oral Daily  . insulin aspart  0-15 Units Subcutaneous TID WC  . metoprolol tartrate  25 mg Oral BID  . pravastatin  40 mg Oral q1800  . rivaroxaban  20 mg Oral Daily  . sodium chloride  3 mL Intravenous Q12H  . sodium chloride  3 mL Intravenous Q12H  . triamcinolone  1 spray Each Nare Daily   . sodium chloride      OBJECTIVE: Physical Exam: Filed Vitals:   07/17/14 1445 07/17/14 1500 07/17/14 2034 07/18/14 0500  BP: 99/68 105/69 106/83 93/65  Pulse: 90 101 114 95  Temp:   98 F (36.7 C) 97.8 F (36.6 C)  TempSrc:   Oral Oral  Resp: 11 13 16 16   Height:      Weight:      SpO2: 94% 95% 99% 99%   No intake or output data in the 24 hours ending 07/18/14 0707  Telemetry reveals atrial fibrillation, rates 80-100's, occasional 2 second pauses  GEN- The patient is well appearing, alert and oriented x 3 today.   Head- normocephalic, atraumatic Eyes-  Sclera clear, conjunctiva pink Ears- hearing intact Neck- supple, no JVP Lymph- no cervical lymphadenopathy Lungs- Clear to ausculation bilaterally, normal work of breathing Heart- IRegular rate and rhythm, no murmurs, rubs or gallops, PMI not laterally displaced GI- soft, NT, ND, + BS Extremities- no clubbing, cyanosis, or edema Skin- no rash or lesion Psych- euthymic mood, full affect Neuro- strength and sensation are intact  LABS: Basic Metabolic Panel:  Recent Labs  07/17/14 0455 07/18/14 0312  NA 138 139  K 4.6 4.5  CL 99 99  CO2 27 26  GLUCOSE 165* 145*  BUN 20 23  CREATININE 1.37* 1.36*  CALCIUM 9.4 9.2   MG 2.1 2.1   CBC:  Recent Labs  07/16/14 0502  WBC 5.3  HGB 14.0  HCT 42.4  MCV 92.0  PLT 173    ASSESSMENT AND PLAN:  Principal Problem:   Atrial fibrillation with controlled ventricular response Active Problems:   Chronic anticoagulation   Atrial fibrillation, controlled Rec: He will stop Dofetilide, as it is ineffective. I discussed the pro's and con's of amiodarone vs rate control. He is reflecting. We will give him a prescription for amio 400 daily to go home with. If he decides to continue Amio, then he will be brought back in for repeat DCCV on amio in approx. 6 weeks.  Mikle Bosworth.D.

## 2014-07-18 NOTE — Discharge Summary (Signed)
CARDIOLOGY DISCHARGE SUMMARY   Patient ID: BURECH MCFARLAND MRN: [id number] DOB/AGE: 03/23/1941 73 y.o.  Admit date: 07/15/2014 Discharge date: 07/18/2014  PCP: Sherrie Mustache, MD Primary Cardiologist: Dr Lovena Le  Primary Discharge Diagnosis:   Atrial fibrillation with controlled ventricular response Secondary Discharge Diagnosis:    Chronic anticoagulation   Atrial fibrillation, controlled   Procedures: DCCV  Hospital Course: GREGGORY SAFRANEK is a 73 y.o. male with a history of atrial fib, on Xarelto. Seen by GT in clinic 09/30 and [facility] admission scheduled. Pt came to the hospital for this on 10/05.   He had the appropriate labs, and his potassium level as well as his magnesium level were appropriate for starting [name]. His QTC was not prolonged. His calculated dose of the [name] was 500 mcg twice a day. He was started on this and tolerated this well. His potassium level, magnesium level, and QTC were followed per the [name] protocol.  On 10/07, he was scheduled for cardioversion which was performed with the assistance of anesthesia. In synchronous shock of 200 J and then 300 J were delivered. The second shock restored sinus rhythm, but he reverted back to atrial fibrillation almost immediately. He tolerated the procedure without complication.  Dr. Caryl Comes and Dr. Lovena Le consulted and decided to stop the dofetilide, as it is ineffective.  On 10/08, he was seen by Dr. Lovena Le and all data were reviewed. The pros and cons of amiodarone versus rate control were discussed with the patient. The decision was made to tentatively start him on amiodarone. If he decides to take the amiodarone, he will get a repeat cardioversion once the amiodarone is fully loaded, in approximately 6 weeks. No further inpatient workup is indicated and he is considered stable for discharge, to follow up as an outpatient.  Labs:   Lab Results  Component Value Date   WBC 5.3 07/16/2014   HGB  14.0 07/16/2014   HCT 42.4 07/16/2014   MCV 92.0 07/16/2014   PLT 173 07/16/2014     Recent Labs Lab 07/18/14 0312  NA 139  K 4.5  CL 99  CO2 26  BUN 23  CREATININE 1.36*  CALCIUM 9.2  GLUCOSE 145*   Lab Results  Component Value Date   DIGOXIN 0.86 07/15/2014   Magnesium  Date Value Ref Range Status  07/18/2014 2.1  1.5 - 2.5 mg/dL Final    EKG: 07/17/2014 Atrial fib Vent. rate 91 BPM PR interval * ms QRS duration 86 ms QT/QTc 382/469 ms P-R-T axes * 29 30  FOLLOW UP PLANS AND APPOINTMENTS Allergies  Allergen Reactions  . Penicillins Anaphylaxis    Swelling of the mouth     Medication List         amiodarone 200 MG tablet  Commonly known as:  PACERONE  Take 1 tablet (200 mg total) by mouth 2 (two) times daily.     ARTIFICIAL TEAR SOLUTION OP  Place 1 drop into both eyes 4 (four) times daily.     digoxin 0.125 MG tablet  Commonly known as:  LANOXIN  Take 0.125 mg by mouth daily.     fenofibrate 160 MG tablet  Take 160 mg by mouth daily.     LIVALO 2 MG Tabs  Generic drug:  Pitavastatin Calcium  Take 1 tablet by mouth daily.     metFORMIN 750 MG 24 hr tablet  Commonly known as:  GLUCOPHAGE-XR  Take 750 mg by mouth daily with breakfast.     metoprolol  tartrate 25 MG tablet  Commonly known as:  LOPRESSOR  Take 1 tablet (25 mg total) by mouth 2 (two) times daily.     Naproxen Sodium 220 MG Caps  Take 220 mg by mouth 2 (two) times daily.     NASACORT ALLERGY 24HR NA  Place 1 spray into the nose daily as needed (for allergies).     ranitidine 150 MG tablet  Commonly known as:  ZANTAC  Take 150 mg by mouth 2 (two) times daily.     rivaroxaban 20 MG Tabs tablet  Commonly known as:  XARELTO  Take 20 mg by mouth daily.        Discharge Instructions   Diet - low sodium heart healthy    Complete by:  As directed      Diet Carb Modified    Complete by:  As directed      Increase activity slowly    Complete by:  As directed            Follow-up Information   Follow up with Cristopher Peru, MD On 08/23/2014. (at 9:00 am)    Specialty:  Cardiology   Contact information:   [address] [place] [address] [phone]       BRING ALL MEDICATIONS WITH YOU TO FOLLOW UP APPOINTMENTS  Time spent with patient to include physician time: 36 min Signed: Rosaria Ferries, PA-C 07/18/2014, 3:37 PM Co-Sign MD

## 2014-07-28 IMAGING — NM NM BONE 3 PHASE
2 series · 12 of 12 positions shown · non-contrast
Comparison: None.

CLINICAL DATA: Right hip pain.  Right total hip prosthesis.

NUCLEAR MEDICINE THREE PHASE BONE SCAN
TECHNIQUE: Radionuclide angiographic images, immediate static
blood pool images, and 3-hour delayed static images were obtained
after intravenous injection of radiopharmaceutical.
Radiopharmaceutical: [id number] MARIA ANTONIA FANG TECHNETIUM TC 99M
MEDRONATE IV KIT

[three phase · 9.33mm/px · 6 of 32 frames shown (1 of 2)]
[frame 3/32]
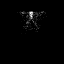
[frame 8/32  full-range]
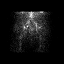
[frame 14/32  full-range]
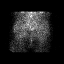
[frame 19/32  full-range]
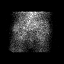
[frame 24/32  full-range]
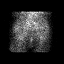
[frame 30/32  full-range]
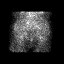

[three phase · 9.33mm/px · 6 of 30 frames shown (2 of 2)]
[frame 3/30]
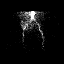
[frame 8/30  full-range]
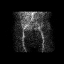
[frame 13/30  full-range]
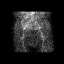
[frame 18/30  full-range]
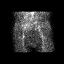
[frame 23/30  full-range]
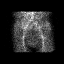
[frame 28/30  full-range]
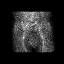

[12 of 12 positions shown; findings below may reference images not displayed]

FINDINGS: Perfusion imaging is normal.

Blood pool imaging is normal.

Delayed bone imaging demonstrates no significant abnormal activity
in the pelvis or hips.  There is slight increased activity around
the stem of the femoral component of the right total hip prosthesis
to the expected degree.  No intense activity.
IMPRESSION: Triple phase bone scan demonstrates no evidence of loosening of the
right total hip prosthesis.

## 2014-07-30 IMAGING — CR DG CHEST 2V
2 series · 2 of 2 positions shown · non-contrast
Comparison: 08/19/2011 and prior chest radiographs dating back to
10/17/2007.

CLINICAL DATA: Preoperative respiratory examination for left hip
surgery.

CHEST - 2 VIEW

[w chest pa]
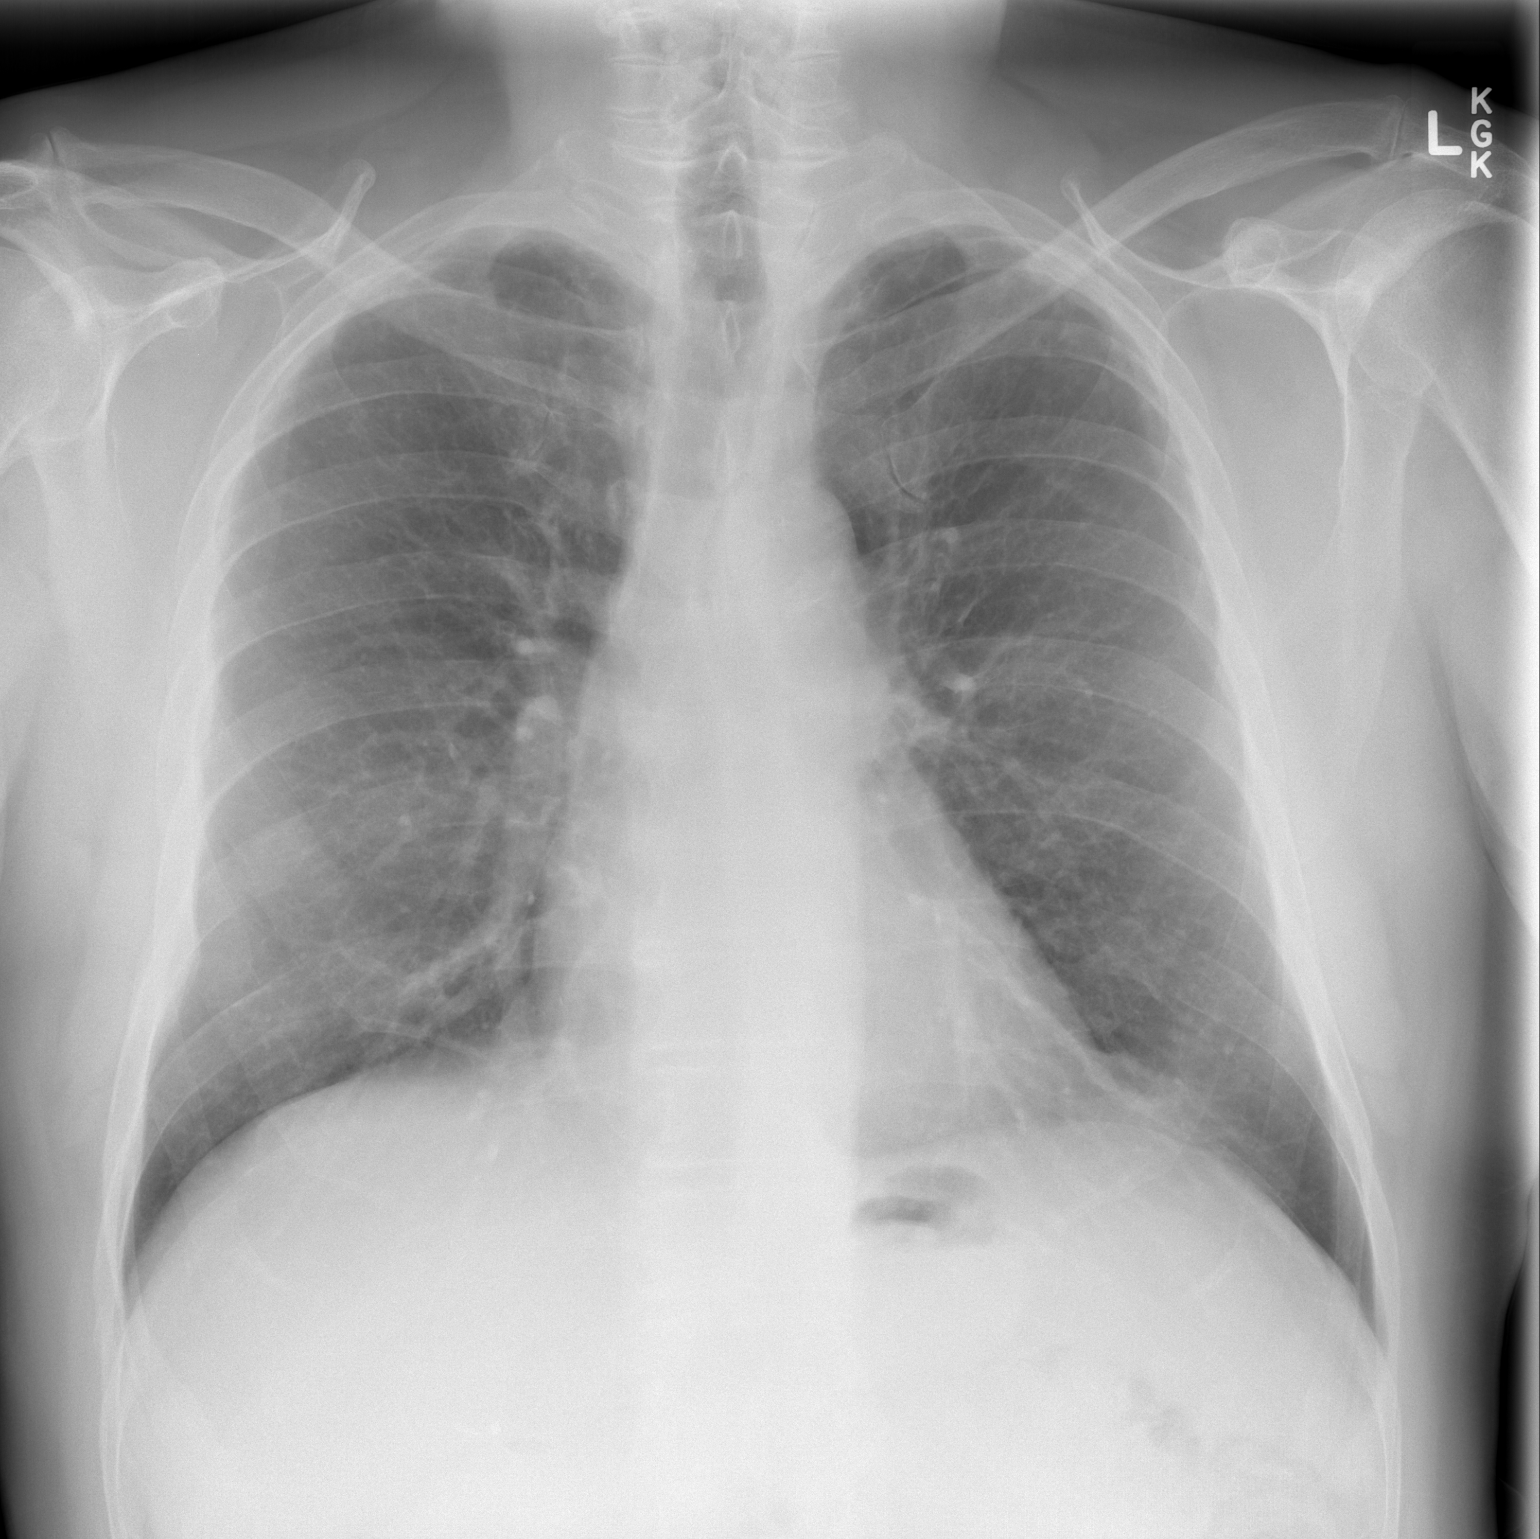

[w chest lat]
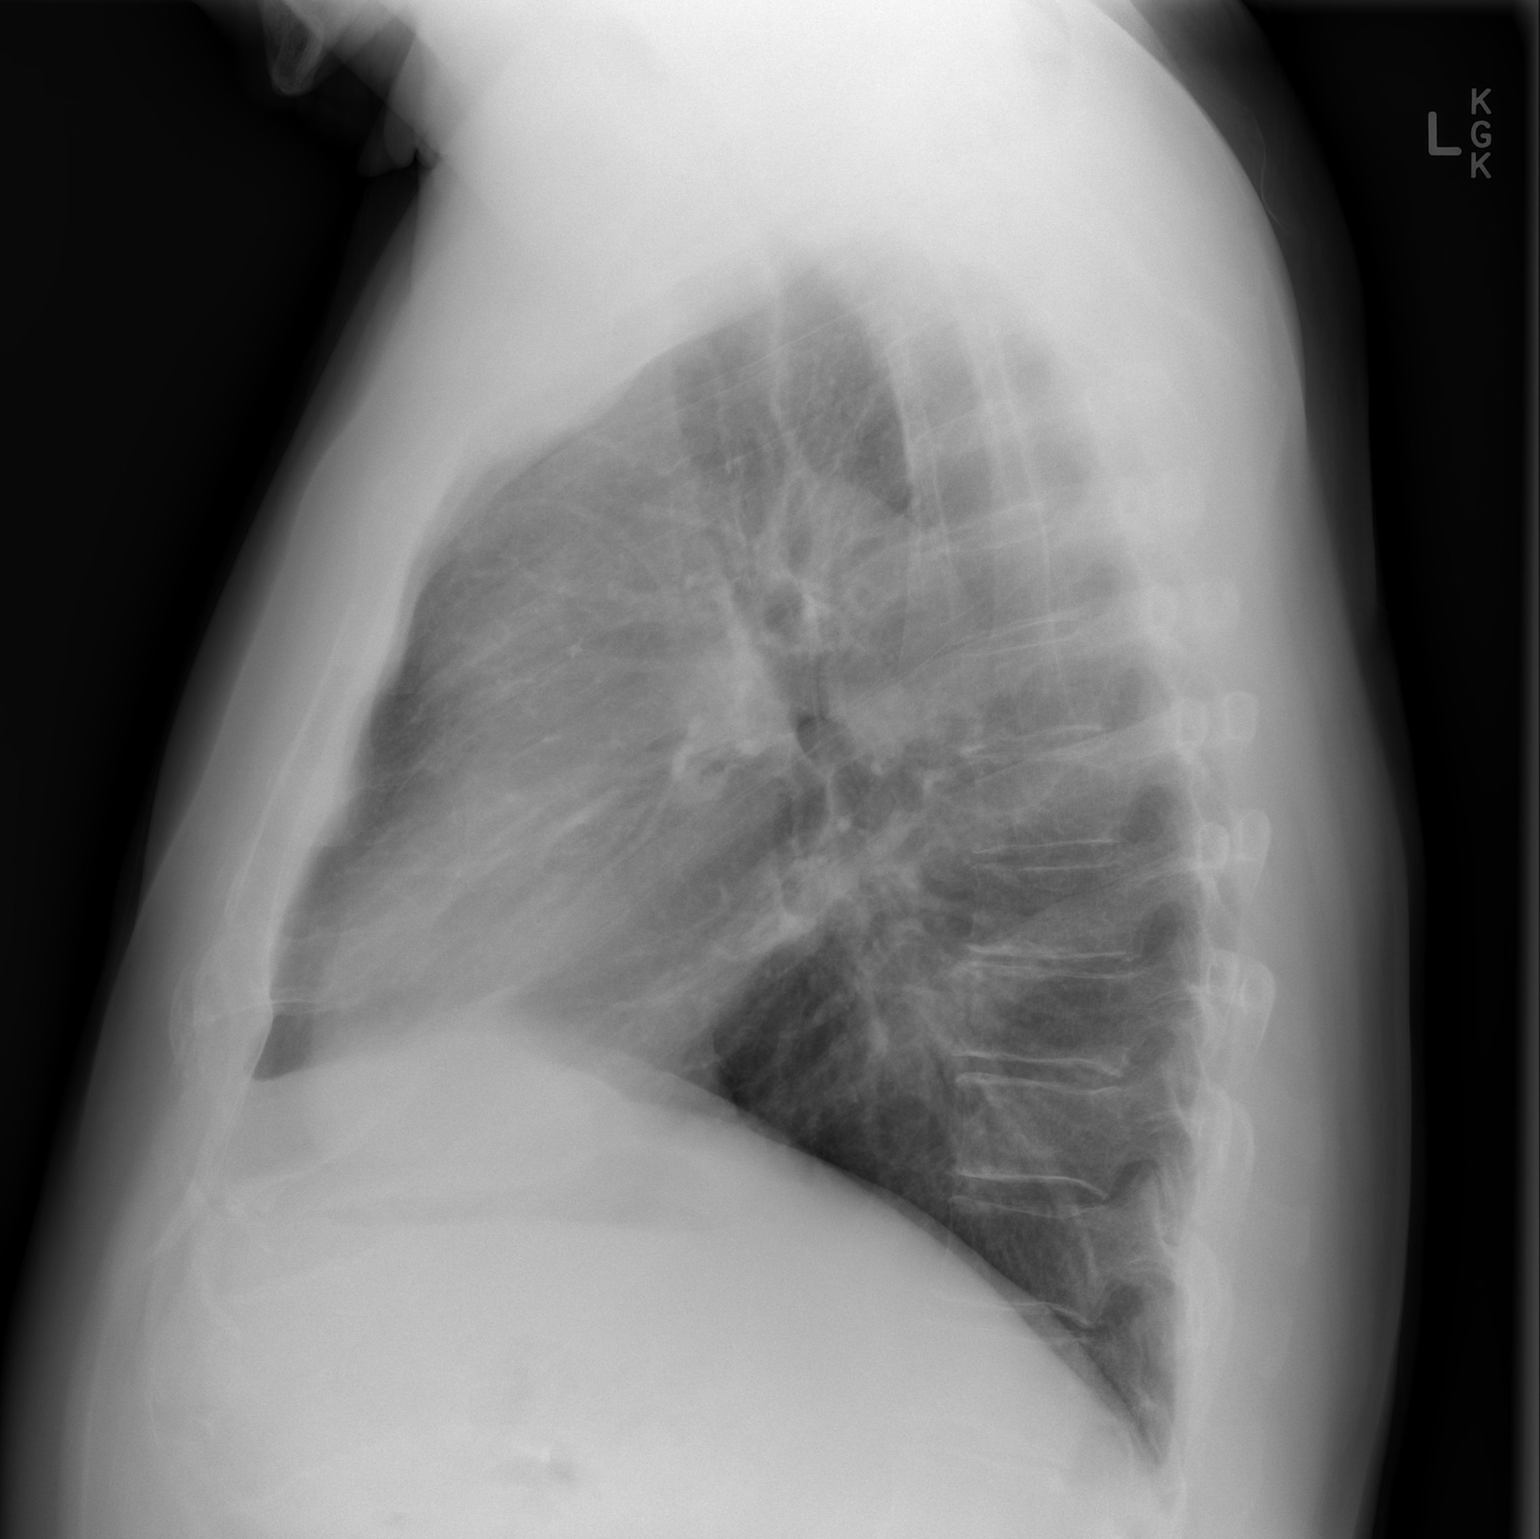

[2 of 2 positions shown; findings below may reference images not displayed]

FINDINGS: The cardiomediastinal silhouette is unremarkable.
Mild peribronchial thickening again noted.
There is no evidence of focal airspace disease, pulmonary edema,
suspicious pulmonary nodule/mass, pleural effusion, or
pneumothorax.
No acute bony abnormalities are identified.
IMPRESSION: No evidence of acute cardiopulmonary disease.

## 2014-08-05 IMAGING — CR DG PORTABLE PELVIS
1 series · 1 of 1 positions shown · non-contrast
Comparison: Plain film of the pelvis 08/16/2011.

CLINICAL DATA: Status post left hip replacement.

PORTABLE PELVIS

[AP]
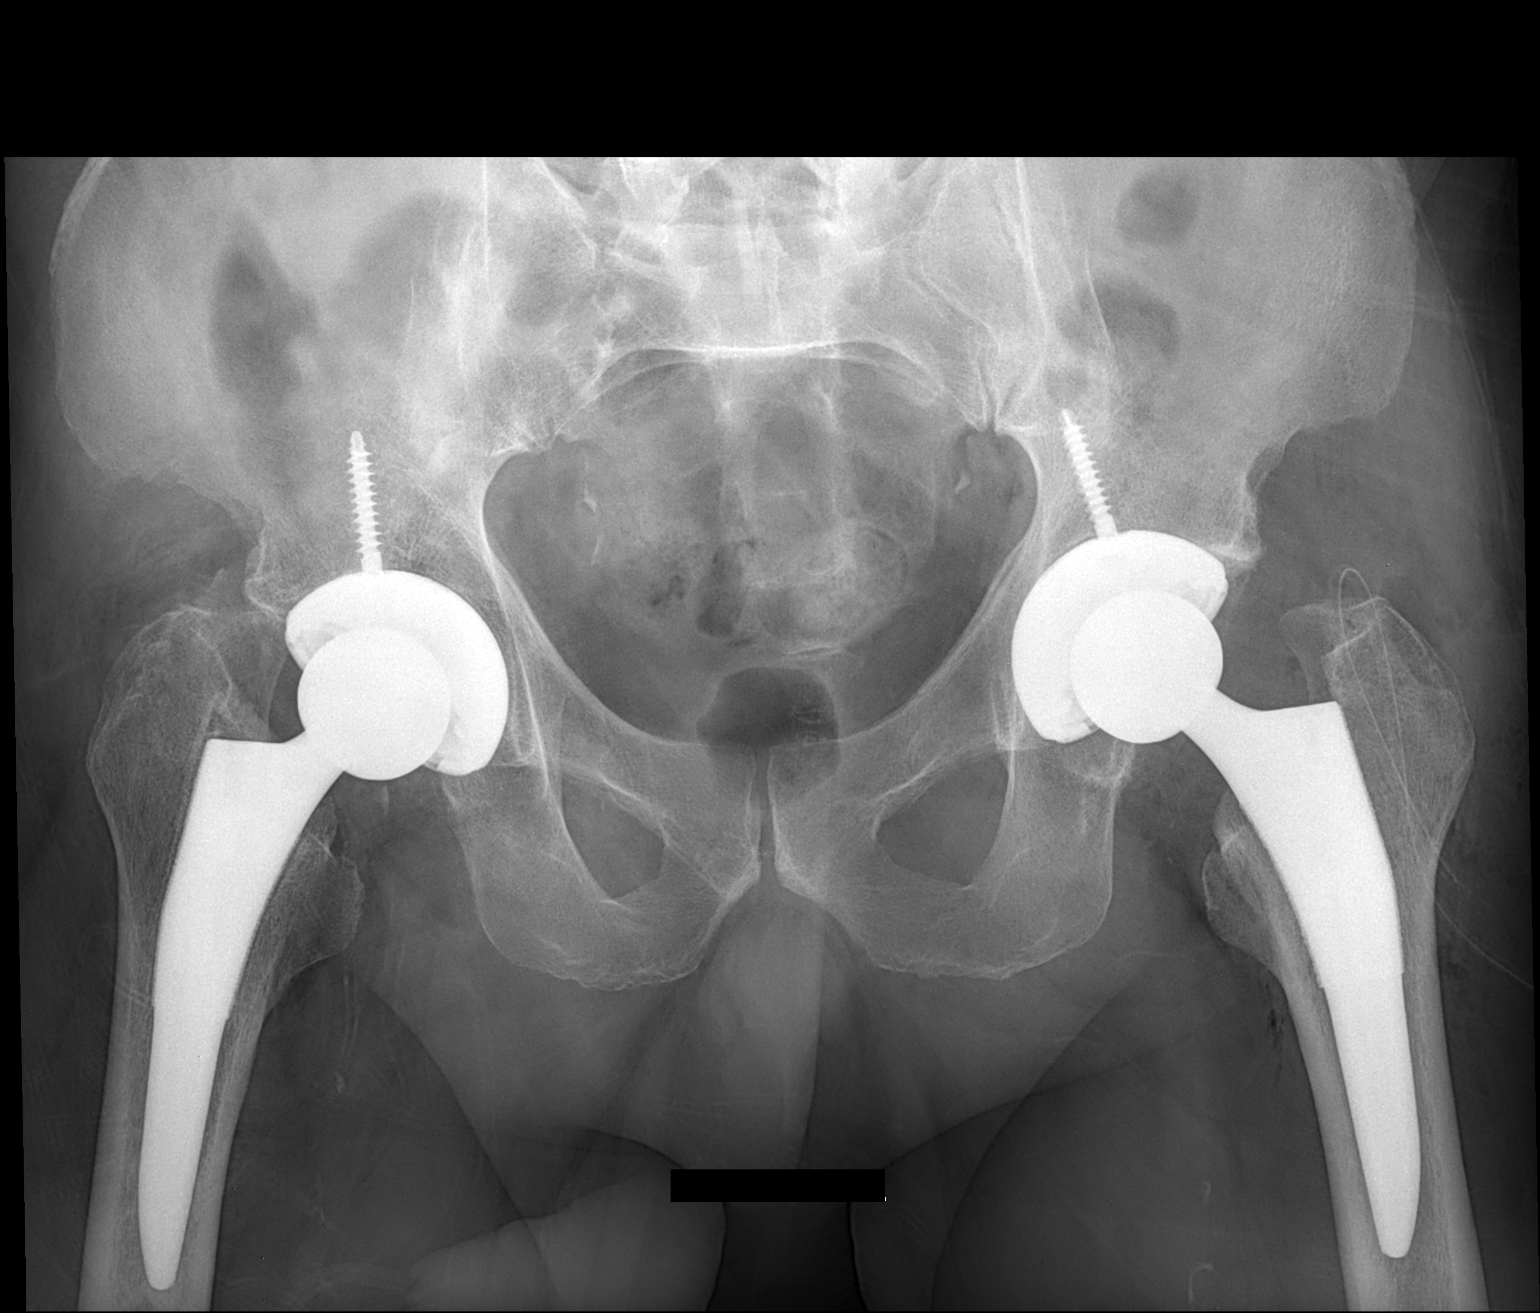

[1 of 1 positions shown; findings below may reference images not displayed]

FINDINGS: The patient has a new left total hip replacement.  The
device is located and no fracture is identified.  Gas in the soft
tissues and surgical drain are noted.  Old right hip replacement
again noted.
IMPRESSION: New left total hip replacement without evidence of complication.

## 2014-08-05 IMAGING — CR DG HIP 1V PORT*L*
1 series · 1 of 1 positions shown · non-contrast
Comparison: Plain film the pelvis 08/16/2011.

CLINICAL DATA: Status post left hip replacement.

PORTABLE LEFT HIP - 1 VIEW

[AP]
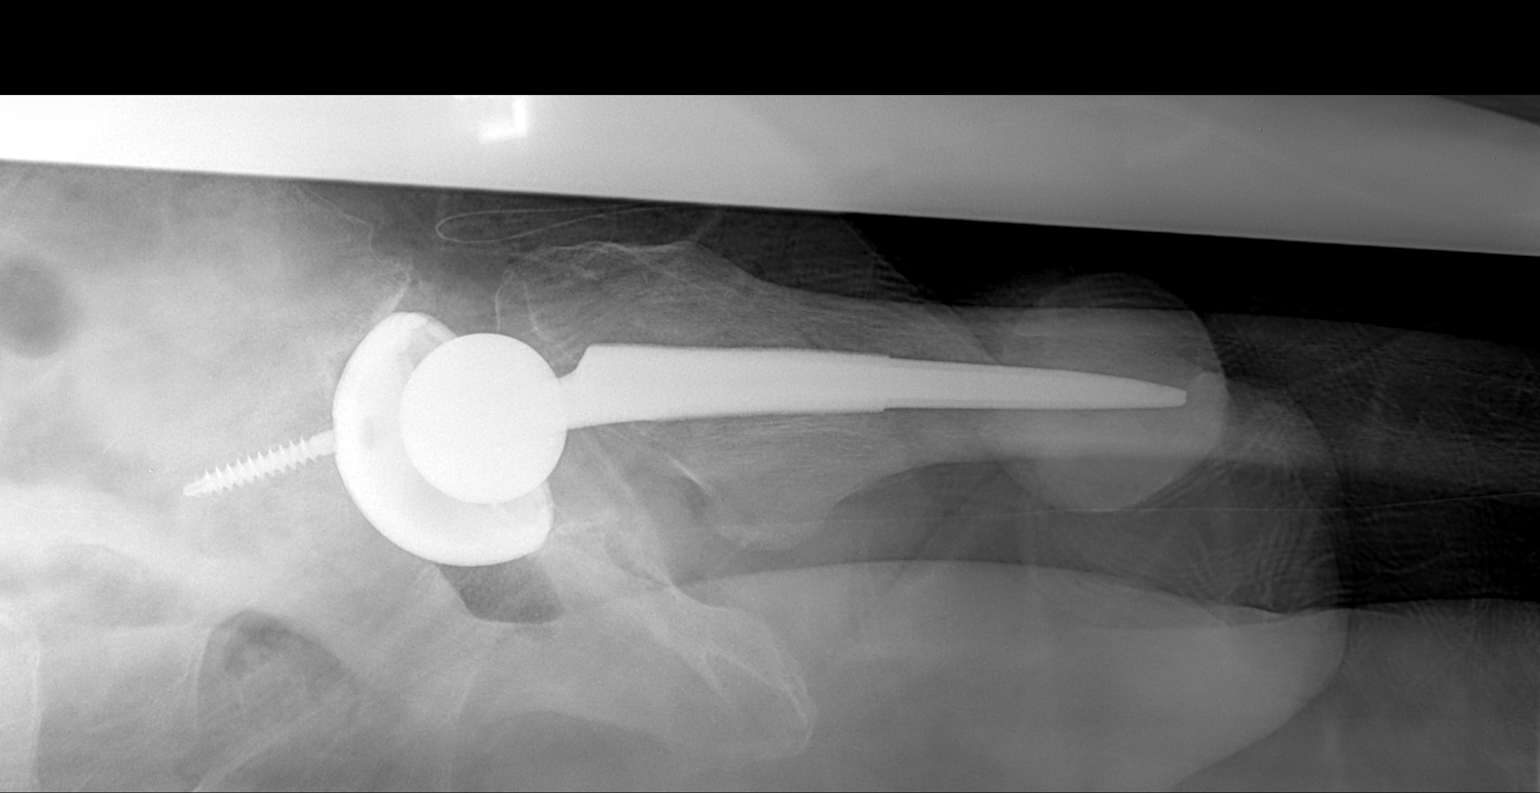

[1 of 1 positions shown; findings below may reference images not displayed]

FINDINGS: New left total hip replacement is in place.  The device
is located.  No fracture is identified.  Surgical drain noted.
IMPRESSION: Left total hip replacement without evidence of complication.

## 2014-08-09 ENCOUNTER — Telehealth: Payer: Self-pay | Admitting: Internal Medicine

## 2014-08-09 NOTE — Telephone Encounter (Signed)
Received request from Nurse fax box, documents faxed for surgical clearance. To: [facility]  Fax number: [phone] Attention: 10.30.15/km

## 2014-08-23 ENCOUNTER — Encounter: Payer: Self-pay | Admitting: Internal Medicine

## 2014-08-23 ENCOUNTER — Ambulatory Visit (INDEPENDENT_AMBULATORY_CARE_PROVIDER_SITE_OTHER): Payer: Medicare Other | Admitting: Internal Medicine

## 2014-08-23 VITALS — BP 116/70 | HR 70 | Ht 74.0 in | Wt 222.2 lb

## 2014-08-23 DIAGNOSIS — I481 Persistent atrial fibrillation: Secondary | ICD-10-CM

## 2014-08-23 DIAGNOSIS — Z7901 Long term (current) use of anticoagulants: Secondary | ICD-10-CM

## 2014-08-23 DIAGNOSIS — I5031 Acute diastolic (congestive) heart failure: Secondary | ICD-10-CM

## 2014-08-23 DIAGNOSIS — I4819 Other persistent atrial fibrillation: Secondary | ICD-10-CM

## 2014-08-23 NOTE — Assessment & Plan Note (Signed)
His ventricular rate appears to be well-controlled. He will continue his current medical therapy. He will continue rate control.

## 2014-08-23 NOTE — Assessment & Plan Note (Signed)
He appears to be tolerating his chronic Xarelto therapy without bleeding or other complication.

## 2014-08-23 NOTE — Patient Instructions (Signed)
Your physician wants you to follow-up in: 12 months with Dr. Taylor. You will receive a reminder letter in the mail two months in advance. If you don't receive a letter, please call our office to schedule the follow-up appointment.    

## 2014-08-23 NOTE — Assessment & Plan Note (Signed)
His congestive heart failure symptoms remain class IIa. No change in medical therapy. His ventricular rate is well controlled. He is encouraged to maintain a low-sodium diet.

## 2014-08-23 NOTE — Progress Notes (Signed)
HPI Tyrone Mitchell returns today for followup. He is a pleasant 73 yo man with a h/o persistent atrial fibrillation, who returns today for followup. The patient has done very well now that his ventricular rate is under better control. He had been tried on dofetilide, but did not maintain sinus rhythm. I recommended amiodarone, and the patient took one dose, and decided that he would continue with a strategy of rate control. Previously, his ventricular rates had been very difficult to control. In the last 5 or 6 months, his ventricular rates have been much easier to control. He denies syncope or peripheral edema. He has minimal palpitations. He checks his heart rate at home, and notes that his rates are between 70 and 80 bpm. Allergies  Allergen Reactions  . Penicillins Anaphylaxis    Swelling of the mouth     Current Outpatient Prescriptions  Medication Sig Dispense Refill  . ARTIFICIAL TEAR SOLUTION OP Place 1 drop into both eyes 4 (four) times daily.    . digoxin (LANOXIN) 0.125 MG tablet Take 0.125 mg by mouth daily.    . fenofibrate 160 MG tablet Take 160 mg by mouth daily.    . metFORMIN (GLUCOPHAGE XR) 500 MG 24 hr tablet Take 500 mg by mouth every evening. Takes 750 mg by mouth in the morning    . metFORMIN (GLUCOPHAGE-XR) 750 MG 24 hr tablet Take 750 mg by mouth daily with breakfast. Takes 500 mg by mouth in the morning    . metoprolol tartrate (LOPRESSOR) 25 MG tablet Take 1 tablet (25 mg total) by mouth 2 (two) times daily. 180 tablet 3  . Naproxen Sodium 220 MG CAPS Take 220 mg by mouth 2 (two) times daily.    . ranitidine (ZANTAC) 150 MG tablet Take 150 mg by mouth 2 (two) times daily.    . rivaroxaban (XARELTO) 20 MG TABS tablet Take 20 mg by mouth daily.    . Triamcinolone Acetonide (NASACORT ALLERGY 24HR NA) Place 1 spray into the nose daily as needed (for allergies).      No current facility-administered medications for this visit.     Past Medical History  Diagnosis  Date  . Atrial fibrillation     a. on flecainide and coumadin  . GERD (gastroesophageal reflux disease)   . Hyperlipidemia   . Anemia   . Cancer 2010    prostate  . Atrial flutter     a. s/p TEE/DCCV 09/2012, EF 50-55% by TEE.  . Mild mitral and aortic regurgitation     a. by TEE 09/2012  . AAA (abdominal aortic aneurysm)     Abd U/S 4/14:  Infrarenal AAA 3.2 x 3.1 cm, bilat CIA mildly dilated with slight increase on L, mild aorto-iliac atherosclerosis w/o stenosis - f/u 1 year  . Complication of anesthesia     HAD DIFFICULTY WAKING AS CHILD  . Diabetes mellitus without complication   . Arthritis   . Visit for monitoring [name] therapy     ROS:   All systems reviewed and negative except as noted in the HPI.   Past Surgical History  Procedure Laterality Date  . Back surgery    . Hip surgery    . Cholecystectomy    . Prostatectomy    . Hernia repair    . Total hip arthroplasty  09/12/2012    Procedure: TOTAL HIP ARTHROPLASTY ANTERIOR APPROACH;  Surgeon: Mauri Pole, MD;  Location: [facility];  Service: Orthopedics;  Laterality: Left;  .  Hip replacement  09/12/2012    left  . Tee without cardioversion  10/09/2012    Procedure: TRANSESOPHAGEAL ECHOCARDIOGRAM (TEE);  Surgeon: Lelon Perla, MD;  Location: [facility] ENDOSCOPY;  Service: Cardiovascular;  Laterality: N/A;  . Cardioversion  10/09/2012    Procedure: CARDIOVERSION;  Surgeon: Lelon Perla, MD;  Location: [facility] ENDOSCOPY;  Service: Cardiovascular;  Laterality: N/A;  . Tonsillectomy       History reviewed. No pertinent family history.   History   Social History  . Marital Status: Divorced    Spouse Name: N/A    Number of Children: N/A  . Years of Education: N/A   Occupational History  . Not on file.   Social History Main Topics  . Smoking status: Former Smoker    Types: Cigars    Quit date: 08/27/2011  . Smokeless tobacco: Never Used  . Alcohol Use: No  . Drug Use: No  . Sexual Activity: Not on file    Other Topics Concern  . Not on file   Social History Narrative     BP 116/70 mmHg  Pulse 70  Ht 6\' 2"  (1.88 m)  Wt 222 lb 3.2 oz (100.789 kg)  BMI 28.52 kg/m2  Physical Exam:  stable appearing 73 yo man, NAD HEENT: Unremarkable Neck:  No JVD, no thyromegally Back:  No CVA tenderness Lungs:  Clear with no wheezes HEART:  irregular rhythm, no murmurs, no rubs, no clicks Abd:  soft, positive bowel sounds, no organomegally, no rebound, no guarding Ext:  2 plus pulses, no edema, no cyanosis, no clubbing Skin:  No rashes no nodules Neuro:  CN II through XII intact, motor grossly intact  EKG - atrial fibrillation with a controlled VR   Assess/Plan:

## 2014-09-19 ENCOUNTER — Encounter (HOSPITAL_COMMUNITY): Payer: Self-pay | Admitting: Internal Medicine

## 2014-10-24 ENCOUNTER — Encounter (HOSPITAL_COMMUNITY): Payer: Self-pay | Admitting: Internal Medicine

## 2014-12-02 ENCOUNTER — Other Ambulatory Visit: Payer: Self-pay | Admitting: Internal Medicine

## 2014-12-11 ENCOUNTER — Other Ambulatory Visit: Payer: Self-pay | Admitting: Internal Medicine

## 2015-05-29 ENCOUNTER — Other Ambulatory Visit: Payer: Self-pay | Admitting: Internal Medicine

## 2015-06-05 ENCOUNTER — Other Ambulatory Visit: Payer: Self-pay | Admitting: Internal Medicine

## 2015-07-17 ENCOUNTER — Telehealth: Payer: Self-pay | Admitting: Internal Medicine

## 2015-07-17 NOTE — Telephone Encounter (Signed)
New Message  Pt requested to speak w/ RN- [facility] during [facility] last [facility]. Pt did not want to make appt w/ Dr Lovena Le in November, thinking he may need earlier appt for this new development. Please call back and discuss.

## 2015-07-17 NOTE — Telephone Encounter (Signed)
Spoke with patient and he is going to get the report and disk and bring to his appointment

## 2015-08-06 ENCOUNTER — Encounter: Payer: Self-pay | Admitting: Internal Medicine

## 2015-08-06 ENCOUNTER — Ambulatory Visit (INDEPENDENT_AMBULATORY_CARE_PROVIDER_SITE_OTHER): Payer: Medicare HMO | Admitting: Internal Medicine

## 2015-08-06 VITALS — BP 124/60 | HR 84 | Ht 74.0 in | Wt 213.0 lb

## 2015-08-06 DIAGNOSIS — Z79899 Other long term (current) drug therapy: Secondary | ICD-10-CM | POA: Diagnosis not present

## 2015-08-06 DIAGNOSIS — I714 Abdominal aortic aneurysm, without rupture, unspecified: Secondary | ICD-10-CM

## 2015-08-06 DIAGNOSIS — M549 Dorsalgia, unspecified: Secondary | ICD-10-CM | POA: Insufficient documentation

## 2015-08-06 DIAGNOSIS — Z7901 Long term (current) use of anticoagulants: Secondary | ICD-10-CM

## 2015-08-06 DIAGNOSIS — M5441 Lumbago with sciatica, right side: Secondary | ICD-10-CM

## 2015-08-06 DIAGNOSIS — I481 Persistent atrial fibrillation: Secondary | ICD-10-CM

## 2015-08-06 DIAGNOSIS — I10 Essential (primary) hypertension: Secondary | ICD-10-CM | POA: Diagnosis not present

## 2015-08-06 DIAGNOSIS — I4819 Other persistent atrial fibrillation: Secondary | ICD-10-CM

## 2015-08-06 LAB — DIGOXIN LEVEL: DIGOXIN LVL: 0.7 ug/L — AB (ref 0.8–2.0)

## 2015-08-06 NOTE — Progress Notes (Signed)
HPI Mr. Cotten returns today for followup. He is a pleasant 74 yo man with a h/o persistent atrial fibrillation, who returns today for followup. The patient has done very well now that his ventricular rate is under better control. He had been tried on dofetilide, but did not maintain sinus rhythm. I recommended amiodarone, and the patient took one dose, and decided that he would continue with a strategy of rate control. Previously, his ventricular rates had been very difficult to control. In the last 5 or 6 months, his ventricular rates have been much easier to control. He denies syncope or peripheral edema. He has minimal palpitations. He checks his heart rate at home, and notes that his rates are between 70 and 80 bpm. His biggest problem which limits his activity is back pain. He has undergone evaluation and has a bulging disc. He is considering back surgery.  Allergies  Allergen Reactions  . Penicillins Anaphylaxis    Swelling of the mouth     Current Outpatient Prescriptions  Medication Sig Dispense Refill  . digoxin (LANOXIN) 0.125 MG tablet Take 0.125 mg by mouth daily.    Marland Kitchen glipiZIDE (GLUCOTROL XL) 2.5 MG 24 hr tablet Take 1 tablet by mouth daily.    . simvastatin (ZOCOR) 20 MG tablet Take 1 tablet by mouth daily.    . ARTIFICIAL TEAR SOLUTION OP Place 1 drop into both eyes 4 (four) times daily.    . fenofibrate 160 MG tablet Take 160 mg by mouth daily.    . metoprolol tartrate (LOPRESSOR) 25 MG tablet Take 1 tablet (25 mg total) by mouth 2 (two) times daily. 180 tablet 0  . ranitidine (ZANTAC) 150 MG tablet Take 150 mg by mouth 2 (two) times daily.    . rivaroxaban (XARELTO) 20 MG TABS tablet Take 20 mg by mouth daily.     No current facility-administered medications for this visit.     Past Medical History  Diagnosis Date  . Atrial fibrillation ([place])     a. on flecainide and coumadin  . GERD (gastroesophageal reflux disease)   . Hyperlipidemia   . Anemia   . Cancer  [facility]) 2010    prostate  . Atrial flutter ([place])     a. s/p TEE/DCCV 09/2012, EF 50-55% by TEE.  . Mild mitral and aortic regurgitation     a. by TEE 09/2012  . AAA (abdominal aortic aneurysm) ([place])     Abd U/S 4/14:  Infrarenal AAA 3.2 x 3.1 cm, bilat CIA mildly dilated with slight increase on L, mild aorto-iliac atherosclerosis w/o stenosis - f/u 1 year  . Complication of anesthesia     HAD DIFFICULTY WAKING AS CHILD  . Diabetes mellitus without complication ([place])   . Arthritis   . Visit for monitoring [name] therapy     ROS:   All systems reviewed and negative except as noted in the HPI.   Past Surgical History  Procedure Laterality Date  . Back surgery    . Hip surgery    . Cholecystectomy    . Prostatectomy    . Hernia repair    . Total hip arthroplasty  09/12/2012    Procedure: TOTAL HIP ARTHROPLASTY ANTERIOR APPROACH;  Surgeon: Mauri Pole, MD;  Location: [facility];  Service: Orthopedics;  Laterality: Left;  . Hip replacement  09/12/2012    left  . Tee without cardioversion  10/09/2012    Procedure: TRANSESOPHAGEAL ECHOCARDIOGRAM (TEE);  Surgeon: Lelon Perla, MD;  Location: [facility] ENDOSCOPY;  Service: Cardiovascular;  Laterality: N/A;  . Cardioversion  10/09/2012    Procedure: CARDIOVERSION;  Surgeon: Lelon Perla, MD;  Location: [facility] ENDOSCOPY;  Service: Cardiovascular;  Laterality: N/A;  . Tonsillectomy    . Cardioversion N/A 07/17/2014    Procedure: CARDIOVERSION;  Surgeon: Deboraha Sprang, MD;  Location: [facility] CATH LAB;  Service: Cardiovascular;  Laterality: N/A;     No family history on file.   Social History   Social History  . Marital Status: Divorced    Spouse Name: N/A  . Number of Children: N/A  . Years of Education: N/A   Occupational History  . Not on file.   Social History Main Topics  . Smoking status: Former Smoker    Types: Cigars    Quit date: 08/27/2011  . Smokeless tobacco: Never Used  . Alcohol Use: No  . Drug Use: No  . Sexual  Activity: Not on file   Other Topics Concern  . Not on file   Social History Narrative     BP 124/60 mmHg  Pulse 84  Ht 6\' 2"  (1.88 m)  Wt 213 lb (96.616 kg)  BMI 27.34 kg/m2  Physical Exam:  stable appearing 74 yo man, NAD HEENT: Unremarkable Neck:  N6 cm JVD, no thyromegally Back:  No CVA tenderness Lungs:  Clear with no wheezes HEART:  irregular rhythm, no murmurs, no rubs, no clicks Abd:  soft, positive bowel sounds, no organomegally, no rebound, no guarding Ext:  2 plus pulses, no edema, no cyanosis, no clubbing Skin:  No rashes no nodules Neuro:  CN II through XII intact, motor grossly intact  EKG - atrial fibrillation with a controlled VR   Assess/Plan:

## 2015-08-06 NOTE — Assessment & Plan Note (Signed)
His blood pressure is well controlled. Will follow. 

## 2015-08-06 NOTE — Assessment & Plan Note (Signed)
He now has chronic atrial fib. He has a controlled Vr.

## 2015-08-06 NOTE — Patient Instructions (Signed)
Medication Instructions:  Your physician recommends that you continue on your current medications as directed. Please refer to the Current Medication list given to you today.  Labwork: Today: Digoxin level  Testing/Procedures: None ordered  Follow-Up: Your physician wants you to follow-up in: 1 year with Dr. Lovena Le. You will receive a reminder letter in the mail two months in advance. If you don't receive a letter, please call our office to schedule the follow-up appointment.   Any Other Special Instructions Will Be Listed Below (If Applicable). - If you need a refill on your cardiac medications before your next appointment, please call your pharmacy.  Thank you for choosing [facility]!!

## 2015-08-06 NOTE — Assessment & Plan Note (Signed)
His symptoms have worsened. He is considering evaluation. I have offered him referral to Dr. Vertell Limber. He is considering his options.

## 2015-08-06 NOTE — Assessment & Plan Note (Signed)
He will continue his systemic anti-coagulation.  Will follow.  

## 2015-08-06 NOTE — Assessment & Plan Note (Signed)
Recent scan measures 3.4 cm AAA. Will follow.

## 2015-08-26 ENCOUNTER — Other Ambulatory Visit: Payer: Self-pay | Admitting: Internal Medicine

## 2015-09-06 ENCOUNTER — Other Ambulatory Visit: Payer: Self-pay | Admitting: Internal Medicine

## 2015-09-23 ENCOUNTER — Ambulatory Visit: Payer: Medicare HMO | Attending: Neurosurgery | Admitting: Physical Therapy

## 2015-09-23 DIAGNOSIS — M5441 Lumbago with sciatica, right side: Secondary | ICD-10-CM | POA: Diagnosis present

## 2015-09-23 DIAGNOSIS — R5381 Other malaise: Secondary | ICD-10-CM | POA: Diagnosis present

## 2015-09-23 NOTE — Therapy (Signed)
[facility] [place], [place], [address] Phone: [phone]   Fax:  [phone]  Physical Therapy Evaluation  Patient Details  Name: Tyrone Mitchell MRN: [id number] Date of Birth: 12/26/1940 Referring Provider: Florinda Marker MD.  Encounter Date: 09/23/2015    Past Medical History  Diagnosis Date  . Atrial fibrillation ([place])     a. on flecainide and coumadin  . GERD (gastroesophageal reflux disease)   . Hyperlipidemia   . Anemia   . Cancer [facility]) 2010    prostate  . Atrial flutter ([place])     a. s/p TEE/DCCV 09/2012, EF 50-55% by TEE.  . Mild mitral and aortic regurgitation     a. by TEE 09/2012  . AAA (abdominal aortic aneurysm) ([place])     Abd U/S 4/14:  Infrarenal AAA 3.2 x 3.1 cm, bilat CIA mildly dilated with slight increase on L, mild aorto-iliac atherosclerosis w/o stenosis - f/u 1 year  . Complication of anesthesia     HAD DIFFICULTY WAKING AS CHILD  . Diabetes mellitus without complication ([place])   . Arthritis   . Visit for monitoring [name] therapy     Past Surgical History  Procedure Laterality Date  . Back surgery    . Hip surgery    . Cholecystectomy    . Prostatectomy    . Hernia repair    . Total hip arthroplasty  09/12/2012    Procedure: TOTAL HIP ARTHROPLASTY ANTERIOR APPROACH;  Surgeon: Mauri Pole, MD;  Location: [facility];  Service: Orthopedics;  Laterality: Left;  . Hip replacement  09/12/2012    left  . Tee without cardioversion  10/09/2012    Procedure: TRANSESOPHAGEAL ECHOCARDIOGRAM (TEE);  Surgeon: Lelon Perla, MD;  Location: [facility] ENDOSCOPY;  Service: Cardiovascular;  Laterality: N/A;  . Cardioversion  10/09/2012    Procedure: CARDIOVERSION;  Surgeon: Lelon Perla, MD;  Location: [facility] ENDOSCOPY;  Service: Cardiovascular;  Laterality: N/A;  . Tonsillectomy    . Cardioversion N/A 07/17/2014    Procedure: CARDIOVERSION;  Surgeon: Deboraha Sprang, MD;  Location: [facility] CATH LAB;  Service:  Cardiovascular;  Laterality: N/A;    There were no vitals filed for this visit.  Visit Diagnosis:  No diagnosis found.      Subjective Assessment - 09/23/15 1718    Subjective I can hardly walk when I first get up.   Patient Stated Goals Get out of pain.   Currently in Pain? Yes   Pain Score 9    Pain Location Back   Pain Orientation Right   Pain Descriptors / Indicators Aching            [facility] PT Assessment - 09/23/15 0001    Assessment   Medical Diagnosis Lumbar radicular pain.   Referring Provider Florinda Marker MD.   Onset Date/Surgical Date --  2010.   Precautions   Precaution Comments Previous lumbar fusion.   Restrictions   Weight Bearing Restrictions No   Balance Screen   Has the patient fallen in the past 6 months No   Has the patient had a decrease in activity level because of a fear of falling?  Yes   Is the patient reluctant to leave their home because of a fear of falling?  No   Home Environment   Living Environment Private residence   Prior Function   Level of Independence Independent   Posture/Postural Control   Posture/Postural Control Postural limitations   Postural Limitations Decreased lumbar lordosis;Flexed trunk  ROM / Strength   AROM / PROM / Strength AROM;Strength   AROM   Overall AROM Comments Active lumbar flexion limited by 60% and active lumbar extension= 12 degrees.   Strength   Overall Strength Comments Right hip flexion= 2+ to 3-/5.   Palpation   Palpation comment tender right of L2-.   Special Tests    Special Tests --  0+/4+ Ach DTR's.   Ambulation/Gait   Gait Comments Very antalgic gait pattern with patient essentially dragging his right LE.                                       Plan - 10-13-15 1720    Clinical Impression Statement My back has gotten prgressively worse since 2010.  He reports pain running from his low back to his anterior thigh to his knee.  He is in a lot of pain with a  reported pain-level of 9/10 and 10/10 when getting up in morning and rising from a seated position.   Pt will benefit from skilled therapeutic intervention in order to improve on the following deficits Pain;Decreased activity tolerance;Decreased range of motion   PT Frequency 2x / week   PT Duration 6 weeks   PT Treatment/Interventions ADLs/Self Care Home Management;Electrical Stimulation;Moist Heat;Ultrasound;Therapeutic activities;Therapeutic exercise;Patient/family education;Manual techniques   PT Next Visit Plan Modalities to patient's right low back and core exercises.   Consulted and Agree with Plan of Care Patient          G-Codes - 10-13-15 1731    Functional Assessment Tool Used FOTO.   Functional Limitation Mobility: Walking and moving around   Mobility: Walking and Moving Around Current Status [phone]) At least 40 percent but less than 60 percent impaired, limited or restricted   Mobility: Walking and Moving Around Goal Status [phone]) At least 20 percent but less than 40 percent impaired, limited or restricted       Problem List Patient Active Problem List   Diagnosis Date Noted  . Back pain 08/06/2015  . Atrial fibrillation with controlled ventricular response ([place]) 07/15/2014  . Chronic anticoagulation 07/15/2014  . Atrial fibrillation, controlled ([place]) 07/15/2014  . Atrial flutter with rapid ventricular response ([place]) 10/08/2012    Class: Acute  . Acute diastolic heart failure ([place]) 10/08/2012    Class: Acute  . Expected blood loss anemia 09/13/2012  . Overweight (BMI 25.0-29.9) 09/13/2012  . S/P left THA, AA 09/12/2012  . Palpitations 10/27/2011  . Shortness of breath 10/27/2011  . AAA (abdominal aortic aneurysm) ([place]) 10/27/2011  . Wound of right leg 08/23/2011  . Status post THR (total hip replacement) 08/18/2011  . Warfarin anticoagulation 08/18/2011  . Accident caused by unspecified machinery 08/16/2011  . Multiple closed fractures of ribs of right side  08/16/2011  . Crush injury elbow/forearm 08/16/2011  . 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 Tear of medial collateral ligament of knee 08/16/2011  . HYPERLIPIDEMIA-MIXED 12/18/2008  . TOBACCO ABUSE 12/18/2008  . HYPERTENSION, BENIGN 12/18/2008  . Atrial fibrillation ([place]) 12/18/2008   Treatment:  MHP and constant pre-mod e'stim x 15 minutes to patient right low back x 15 minutes.  Patient tolerated tx well. [place], [place]   MPT  2015/10/13, 5:32 PM  [facility] [address] [place], [place], [address] Phone: [phone]   Fax:  [phone]  Name: KRISZTIAN SIMONETTI MRN: [id number] Date  of Birth: 03-16-1941

## 2015-09-29 ENCOUNTER — Ambulatory Visit: Payer: Medicare HMO | Admitting: Physical Therapy

## 2015-09-29 DIAGNOSIS — R5381 Other malaise: Secondary | ICD-10-CM

## 2015-09-29 DIAGNOSIS — M5441 Lumbago with sciatica, right side: Secondary | ICD-10-CM | POA: Diagnosis not present

## 2015-09-29 NOTE — Therapy (Signed)
[facility] [place], [place], [address] Phone: [phone]   Fax:  [phone]  Physical Therapy Treatment  Patient Details  Name: Tyrone Mitchell MRN: [id number] Date of Birth: Feb 13, 1941 Referring Provider: Florinda Marker MD.  Encounter Date: 09/29/2015      PT End of Session - 09/29/15 1354    Visit Number 2   Number of Visits 12   Date for PT Re-Evaluation 11/04/15   PT Start Time 1017   PT Stop Time 1444   PT Time Calculation (min) 50 min   Activity Tolerance Patient tolerated treatment well   Behavior During Therapy [facility] for tasks assessed/performed      Past Medical History  Diagnosis Date  . Atrial fibrillation ([place])     a. on flecainide and coumadin  . GERD (gastroesophageal reflux disease)   . Hyperlipidemia   . Anemia   . Cancer [facility]) 2010    prostate  . Atrial flutter ([place])     a. s/p TEE/DCCV 09/2012, EF 50-55% by TEE.  . Mild mitral and aortic regurgitation     a. by TEE 09/2012  . AAA (abdominal aortic aneurysm) ([place])     Abd U/S 4/14:  Infrarenal AAA 3.2 x 3.1 cm, bilat CIA mildly dilated with slight increase on L, mild aorto-iliac atherosclerosis w/o stenosis - f/u 1 year  . Complication of anesthesia     HAD DIFFICULTY WAKING AS CHILD  . Diabetes mellitus without complication ([place])   . Arthritis   . Visit for monitoring [name] therapy     Past Surgical History  Procedure Laterality Date  . Back surgery    . Hip surgery    . Cholecystectomy    . Prostatectomy    . Hernia repair    . Total hip arthroplasty  09/12/2012    Procedure: TOTAL HIP ARTHROPLASTY ANTERIOR APPROACH;  Surgeon: Mauri Pole, MD;  Location: [facility] ORS;  Service: Orthopedics;  Laterality: Left;  . Hip replacement  09/12/2012    left  . Tee without cardioversion  10/09/2012    Procedure: TRANSESOPHAGEAL ECHOCARDIOGRAM (TEE);  Surgeon: Lelon Perla, MD;  Location: [facility] ENDOSCOPY;  Service: Cardiovascular;   Laterality: N/A;  . Cardioversion  10/09/2012    Procedure: CARDIOVERSION;  Surgeon: Lelon Perla, MD;  Location: [facility] ENDOSCOPY;  Service: Cardiovascular;  Laterality: N/A;  . Tonsillectomy    . Cardioversion N/A 07/17/2014    Procedure: CARDIOVERSION;  Surgeon: Deboraha Sprang, MD;  Location: [facility] CATH LAB;  Service: Cardiovascular;  Laterality: N/A;    There were no vitals filed for this visit.  Visit Diagnosis:  Right-sided low back pain with right-sided sciatica  Debility      Subjective Assessment - 09/29/15 1354    Subjective Patient states that he spoke to PA and that surgeon has no plans to do surgery at this time because nothing he could do to improve the situation. Patient also reports that to abolish pain he twists maximally to the left. PT advised patient not do this.   Patient Stated Goals Get out of pain.   Currently in Pain? Yes   Pain Score 5    Pain Location Back   Pain Orientation Right   Pain Descriptors / Indicators Aching   Pain Type Chronic pain                         [facility] Adult PT Treatment/Exercise - 09/29/15 0001  Self-Care   Self-Care ADL's;Other Self-Care Comments   ADL's Discussed avoiding flexion with working at sink by stepping into cupboard, ADL modification when vacuuming. Additionally advised patient to lie prone every 2 hours x 5 min or when he has radicular sx.    Other Self-Care Comments  Explanation of dic bulge using model and discussion as to why he should avoid twisitng.   Exercises   Exercises Lumbar   Lumbar Exercises: Stretches   Single Knee to Chest Stretch 1 rep;30 seconds   Single Knee to Chest Stretch Limitations caused pain in neck so stopped   Lumbar Exercises: Supine   Bridge 10 reps;5 seconds   Bridge Limitations increases pain to 7/10   Lumbar Exercises: Prone   Other Prone Lumbar Exercises prone lying x 15 min abolishes pain in first minute; POE painful   Modalities   Modalities Electrical  Stimulation;Moist Heat   Moist Heat Therapy   Number Minutes Moist Heat 15 Minutes   Moist Heat Location Lumbar Spine   Electrical Stimulation   Electrical Stimulation Location lumbar   Electrical Stimulation Action premod   Electrical Stimulation Parameters 80-150 hz x 15 min to tolerance   Electrical Stimulation Goals Pain   Manual Therapy   Manual Therapy Soft tissue mobilization   Soft tissue mobilization B QL and paraspinals                  PT Short Term Goals - 09/23/15 1757    PT SHORT TERM GOAL #1   Title Ind with a HEP.   Time 2   Period Weeks   Status New           PT Long Term Goals - 09/23/15 1757    PT LONG TERM GOAL #1   Title Eliminate right LE pain.   Time 6   Period Weeks   Status New   PT LONG TERM GOAL #2   Title Perform ADL's with pain not > 3/10.               Plan - 09/29/15 1443    Clinical Impression Statement Patient has decrease in pain likely because he has been in bed for 4 days sick. He continues to ambulate with antalgic gait pattern, but states it is better than last week. He is able to abolish pain with prone lying. PT encouraged standing extension and prone lying thorughout the day. No goals met as only second visit.    Pt will benefit from skilled therapeutic intervention in order to improve on the following deficits Pain;Decreased activity tolerance;Decreased range of motion   PT Frequency 2x / week   PT Duration 6 weeks   PT Treatment/Interventions ADLs/Self Care Home Management;Electrical Stimulation;Moist Heat;Ultrasound;Therapeutic activities;Therapeutic exercise;Patient/family education;Manual techniques   PT Next Visit Plan Assess prone lying and standing extensions. Modalities to patient's right low back and core exercises.   PT Home Exercise Plan prone lying every 2 hours x 5 min, standing extensions    Consulted and Agree with Plan of Care Patient        Problem List Patient Active Problem List    Diagnosis Date Noted  . Back pain 08/06/2015  . Atrial fibrillation with controlled ventricular response ([place]) 07/15/2014  . Chronic anticoagulation 07/15/2014  . Atrial fibrillation, controlled ([place]) 07/15/2014  . Atrial flutter with rapid ventricular response ([place]) 10/08/2012    Class: Acute  . Acute diastolic heart failure ([place]) 10/08/2012    Class: Acute  . Expected blood loss anemia  09/13/2012  . Overweight (BMI 25.0-29.9) 09/13/2012  . S/P left THA, AA 09/12/2012  . Palpitations 10/27/2011  . Shortness of breath 10/27/2011  . AAA (abdominal aortic aneurysm) ([place]) 10/27/2011  . Wound of right leg 08/23/2011  . Status post THR (total hip replacement) 08/18/2011  . Warfarin anticoagulation 08/18/2011  . Accident caused by unspecified machinery 08/16/2011  . Multiple closed fractures of ribs of right side 08/16/2011  . Crush injury elbow/forearm 08/16/2011  . 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 Tear of medial collateral ligament of knee 08/16/2011  . HYPERLIPIDEMIA-MIXED 12/18/2008  . TOBACCO ABUSE 12/18/2008  . HYPERTENSION, BENIGN 12/18/2008  . Atrial fibrillation ([place]) 12/18/2008    Madelyn Flavors PT  09/29/2015, 3:46 PM  [facility] [address] [place], [place], [address] Phone: [phone]   Fax:  [phone]  Name: BRENNDEN MASTEN MRN: [id number] Date of Birth: 24-Mar-1941

## 2015-10-01 ENCOUNTER — Encounter: Payer: Self-pay | Admitting: Physical Therapy

## 2015-10-01 ENCOUNTER — Ambulatory Visit: Payer: Medicare HMO | Admitting: Physical Therapy

## 2015-10-01 DIAGNOSIS — M5441 Lumbago with sciatica, right side: Secondary | ICD-10-CM | POA: Diagnosis not present

## 2015-10-01 DIAGNOSIS — R5381 Other malaise: Secondary | ICD-10-CM

## 2015-10-01 NOTE — Therapy (Signed)
[facility] [place], [place], [address] Phone: [phone]   Fax:  [phone]  Physical Therapy Treatment  Patient Details  Name: Tyrone Mitchell MRN: [id number] Date of Birth: [birth year] Referring Provider: Florinda Marker MD.  Encounter Date: 10/01/2015      PT End of Session - 10/01/15 1303    Visit Number 3   Number of Visits 12   Date for PT Re-Evaluation 11/04/15   PT Start Time 1304   PT Stop Time 1355   PT Time Calculation (min) 51 min   Activity Tolerance Patient tolerated treatment well   Behavior During Therapy [facility] for tasks assessed/performed      Past Medical History  Diagnosis Date  . Atrial fibrillation ([place])     a. on flecainide and coumadin  . GERD (gastroesophageal reflux disease)   . Hyperlipidemia   . Anemia   . Cancer [facility]) 2010    prostate  . Atrial flutter ([place])     a. s/p TEE/DCCV 09/2012, EF 50-55% by TEE.  . Mild mitral and aortic regurgitation     a. by TEE 09/2012  . AAA (abdominal aortic aneurysm) ([place])     Abd U/S 4/14:  Infrarenal AAA 3.2 x 3.1 cm, bilat CIA mildly dilated with slight increase on L, mild aorto-iliac atherosclerosis w/o stenosis - f/u 1 year  . Complication of anesthesia     HAD DIFFICULTY WAKING AS CHILD  . Diabetes mellitus without complication ([place])   . Arthritis   . Visit for monitoring [name] therapy     Past Surgical History  Procedure Laterality Date  . Back surgery    . Hip surgery    . Cholecystectomy    . Prostatectomy    . Hernia repair    . Total hip arthroplasty  09/12/2012    Procedure: TOTAL HIP ARTHROPLASTY ANTERIOR APPROACH;  Surgeon: Mauri Pole, MD;  [facility];  Service: Orthopedics;  Laterality: Left;  . Hip replacement  09/12/2012    left  . Tee without cardioversion  10/09/2012    Procedure: TRANSESOPHAGEAL ECHOCARDIOGRAM (TEE);  Surgeon: Lelon Perla, MD;  Location: [facility] ENDOSCOPY;  Service: Cardiovascular;   Laterality: N/A;  . Cardioversion  10/09/2012    Procedure: CARDIOVERSION;  Surgeon: Lelon Perla, MD;  Location: [facility] ENDOSCOPY;  Service: Cardiovascular;  Laterality: N/A;  . Tonsillectomy    . Cardioversion N/A 07/17/2014    Procedure: CARDIOVERSION;  Surgeon: Deboraha Sprang, MD;  Location: [facility] CATH LAB;  Service: Cardiovascular;  Laterality: N/A;    There were no vitals filed for this visit.  Visit Diagnosis:  Right-sided low back pain with right-sided sciatica  Debility      Subjective Assessment - 10/01/15 1302    Subjective Reports pain isn't as bad today.    Patient Stated Goals Get out of pain.   Currently in Pain? Yes   Pain Score 5    Pain Location Back   Pain Orientation Right   Pain Type Chronic pain            [facility] PT Assessment - 10/01/15 0001    Assessment   Medical Diagnosis Lumbar radicular pain.                     [place] Adult PT Treatment/Exercise - 10/01/15 0001    Lumbar Exercises: Standing   Other Standing Lumbar Exercises EIS x20 reps   Lumbar Exercises: Supine   Ab Set 15 reps;5  seconds   Bridge 20 reps;3 seconds   Lumbar Exercises: Prone   Straight Leg Raise 5 reps   Other Prone Lumbar Exercises Prone lying with 3 pillows at head x5 min   Modalities   Modalities Electrical Stimulation;Moist Heat   Moist Heat Therapy   Number Minutes Moist Heat 15 Minutes   Moist Heat Location Lumbar Spine   Electrical Stimulation   Electrical Stimulation Location R low back   Electrical Stimulation Action Pre-Mod   Electrical Stimulation Parameters 80-150 hz x15 min   Electrical Stimulation Goals Pain   Manual Therapy   Manual Therapy Soft tissue mobilization   Soft tissue mobilization STW to R QL and lumbar paraspinals in R sidelying to decrease tightness                  PT Short Term Goals - 10/01/15 1348    PT SHORT TERM GOAL #1   Title Ind with a HEP.   Time 2   Period Weeks   Status On-going           PT  Long Term Goals - 10/01/15 1348    PT LONG TERM GOAL #1   Title Eliminate right LE pain.   Time 6   Period Weeks   Status On-going  Currently still has RLE sx but not as intense per patient report 10/01/2015   PT LONG TERM GOAL #2   Title Perform ADL's with pain not > 3/10.   Status On-going               Plan - 10/01/15 1343    Clinical Impression Statement Patient tolerated today's treatment fairly well and was able to tolerate extension based exercises as well as beginner core strengthening well. Attempted prone R hip extension although he noted that the exercise was not the same pain that he was used to experiencing but was uncomfortable. Bridging did not elicit pain today and tolerated them well. Experienced episodes of upper to mid back cramping as well as HS cramping during today's session. Goals remain on-going at this time seocndary to pain and RLE pain to which patient notes is not as intense. Normal modalities response noted following removal of the modalites. Denied pain following today's treatment and was encouraged to complete HEP given in previous treatment.   Pt will benefit from skilled therapeutic intervention in order to improve on the following deficits Pain;Decreased activity tolerance;Decreased range of motion   PT Frequency 2x / week   PT Duration 6 weeks   PT Treatment/Interventions ADLs/Self Care Home Management;Electrical Stimulation;Moist Heat;Ultrasound;Therapeutic activities;Therapeutic exercise;Patient/family education;Manual techniques   PT Next Visit Plan Continue extension based exercises and core strengthening with modalities PRN per MPT POC.        Problem List Patient Active Problem List   Diagnosis Date Noted  . Back pain 08/06/2015  . Atrial fibrillation with controlled ventricular response ([place]) 07/15/2014  . Chronic anticoagulation 07/15/2014  . Atrial fibrillation, controlled ([place]) 07/15/2014  . Atrial flutter with rapid ventricular  response ([place]) 10/08/2012    Class: Acute  . Acute diastolic heart failure ([place]) 10/08/2012    Class: Acute  . Expected blood loss anemia 09/13/2012  . Overweight (BMI 25.0-29.9) 09/13/2012  . S/P left THA, AA 09/12/2012  . Palpitations 10/27/2011  . Shortness of breath 10/27/2011  . AAA (abdominal aortic aneurysm) ([place]) 10/27/2011  . Wound of right leg 08/23/2011  . Status post THR (total hip replacement) 08/18/2011  . Warfarin anticoagulation 08/18/2011  .  Accident caused by unspecified machinery 08/16/2011  . Multiple closed fractures of ribs of right side 08/16/2011  . Crush injury elbow/forearm 08/16/2011  . 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 Tear of medial collateral ligament of knee 08/16/2011  . HYPERLIPIDEMIA-MIXED 12/18/2008  . TOBACCO ABUSE 12/18/2008  . HYPERTENSION, BENIGN 12/18/2008  . Atrial fibrillation ([place]) 12/18/2008    Wynelle Fanny, PTA 10/01/2015, 2:01 PM  [facility] [address] [place], [place], [address] Phone: [phone]   Fax:  [phone]  Name: Tyrone Mitchell MRN: [id number] Date of Birth: 1941/05/10

## 2015-10-07 ENCOUNTER — Ambulatory Visit: Payer: Medicare HMO | Admitting: *Deleted

## 2015-10-07 DIAGNOSIS — M5441 Lumbago with sciatica, right side: Secondary | ICD-10-CM

## 2015-10-07 DIAGNOSIS — R5381 Other malaise: Secondary | ICD-10-CM

## 2015-10-07 NOTE — Therapy (Signed)
[facility] [place], [place], [address] Phone: [phone]   Fax:  [phone]  Physical Therapy Treatment  Patient Details  Name: Tyrone Mitchell MRN: [id number] Date of Birth: Feb 21, 1941 Referring Provider: Florinda Marker MD.  Encounter Date: 10/07/2015      PT End of Session - 10/07/15 1515    Visit Number 4   Number of Visits 12   Date for PT Re-Evaluation 11/04/15   PT Start Time 1430   PT Stop Time 1519   PT Time Calculation (min) 49 min      Past Medical History  Diagnosis Date  . Atrial fibrillation ([place])     a. on flecainide and coumadin  . GERD (gastroesophageal reflux disease)   . Hyperlipidemia   . Anemia   . Cancer [facility]) 2010    prostate  . Atrial flutter ([place])     a. s/p TEE/DCCV 09/2012, EF 50-55% by TEE.  . Mild mitral and aortic regurgitation     a. by TEE 09/2012  . AAA (abdominal aortic aneurysm) ([place])     Abd U/S 4/14:  Infrarenal AAA 3.2 x 3.1 cm, bilat CIA mildly dilated with slight increase on L, mild aorto-iliac atherosclerosis w/o stenosis - f/u 1 year  . Complication of anesthesia     HAD DIFFICULTY WAKING AS CHILD  . Diabetes mellitus without complication ([place])   . Arthritis   . Visit for monitoring [name] therapy     Past Surgical History  Procedure Laterality Date  . Back surgery    . Hip surgery    . Cholecystectomy    . Prostatectomy    . Hernia repair    . Total hip arthroplasty  09/12/2012    Procedure: TOTAL HIP ARTHROPLASTY ANTERIOR APPROACH;  Surgeon: Mauri Pole, MD;  [facility];  Service: Orthopedics;  Laterality: Left;  . Hip replacement  09/12/2012    left  . Tee without cardioversion  10/09/2012    Procedure: TRANSESOPHAGEAL ECHOCARDIOGRAM (TEE);  Surgeon: Lelon Perla, MD;  Location: [facility] ENDOSCOPY;  Service: Cardiovascular;  Laterality: N/A;  . Cardioversion  10/09/2012    Procedure: CARDIOVERSION;  Surgeon: Lelon Perla, MD;  Location: [facility]  ENDOSCOPY;  Service: Cardiovascular;  Laterality: N/A;  . Tonsillectomy    . Cardioversion N/A 07/17/2014    Procedure: CARDIOVERSION;  Surgeon: Deboraha Sprang, MD;  Location: [facility] CATH LAB;  Service: Cardiovascular;  Laterality: N/A;    There were no vitals filed for this visit.  Visit Diagnosis:  Right-sided low back pain with right-sided sciatica  Debility      Subjective Assessment - 10/07/15 1437    Subjective Reports pain isn't as bad today. 4/10.  6-7/10 yesterday due to overdoing it   Patient Stated Goals Get out of pain.   Pain Score 4    Pain Location Back   Pain Orientation Right   Pain Descriptors / Indicators Aching   Pain Type Chronic pain                         [facility] Adult PT Treatment/Exercise - 10/07/15 0001    Modalities   Modalities Electrical Stimulation;Moist Heat;Ultrasound   Moist Heat Therapy   Number Minutes Moist Heat 15 Minutes   Moist Heat Location Lumbar Spine   Electrical Stimulation   Electrical Stimulation Location R low back premod x15 mins  80-150hz    Electrical Stimulation Goals Pain   Ultrasound   Ultrasound  Location RT LB paras   Ultrasound Parameters 1.5 w/cm2 x 10 minsLT sidelying   Ultrasound Goals Pain   Manual Therapy   Manual Therapy Soft tissue mobilization   Soft tissue mobilization STW to R QL and lumbar paraspinals in R sidelying to decrease tightness                  PT Short Term Goals - 10/01/15 1348    PT SHORT TERM GOAL #1   Title Ind with a HEP.   Time 2   Period Weeks   Status On-going           PT Long Term Goals - 10/01/15 1348    PT LONG TERM GOAL #1   Title Eliminate right LE pain.   Time 6   Period Weeks   Status On-going  Currently still has RLE sx but not as intense per patient report 10/01/2015   PT LONG TERM GOAL #2   Title Perform ADL's with pain not > 3/10.   Status On-going               Plan - 10/07/15 1516    Clinical Impression Statement Pt did  fairly well today with Rx and tolerated [place] and STW well. He was sore and tight along QL and LB paras along RT side.   Pt will benefit from skilled therapeutic intervention in order to improve on the following deficits Pain;Decreased activity tolerance;Decreased range of motion   PT Frequency 2x / week   PT Duration 6 weeks   PT Treatment/Interventions ADLs/Self Care Home Management;Electrical Stimulation;Moist Heat;Ultrasound;Therapeutic activities;Therapeutic exercise;Patient/family education;Manual techniques   PT Next Visit Plan Continue extension based exercises and core strengthening with modalities PRN per MPT POC.   PT Home Exercise Plan prone lying every 2 hours x 5 min, standing extensions    Consulted and Agree with Plan of Care Patient        Problem List Patient Active Problem List   Diagnosis Date Noted  . Back pain 08/06/2015  . Atrial fibrillation with controlled ventricular response ([place]) 07/15/2014  . Chronic anticoagulation 07/15/2014  . Atrial fibrillation, controlled ([place]) 07/15/2014  . Atrial flutter with rapid ventricular response ([place]) 10/08/2012    Class: Acute  . Acute diastolic heart failure ([place]) 10/08/2012    Class: Acute  . Expected blood loss anemia 09/13/2012  . Overweight (BMI 25.0-29.9) 09/13/2012  . S/P left THA, AA 09/12/2012  . Palpitations 10/27/2011  . Shortness of breath 10/27/2011  . AAA (abdominal aortic aneurysm) ([place]) 10/27/2011  . Wound of right leg 08/23/2011  . Status post THR (total hip replacement) 08/18/2011  . Warfarin anticoagulation 08/18/2011  . Accident caused by unspecified machinery 08/16/2011  . Multiple closed fractures of ribs of right side 08/16/2011  . Crush injury elbow/forearm 08/16/2011  . 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 Tear of medial collateral ligament of knee 08/16/2011  . HYPERLIPIDEMIA-MIXED 12/18/2008  . TOBACCO ABUSE 12/18/2008  . HYPERTENSION, BENIGN 12/18/2008  . Atrial  fibrillation ([place]) 12/18/2008    Arshiya Jakes,CHRIS, PTA 10/07/2015, 5:34 PM  [facility] [address] [place], [place], [address] Phone: [phone]   Fax:  [phone]  Name: Tyrone Mitchell MRN: [id number] Date of Birth: [birth year]

## 2015-10-10 ENCOUNTER — Encounter: Payer: Self-pay | Admitting: Physical Therapy

## 2015-10-10 ENCOUNTER — Ambulatory Visit: Payer: Medicare HMO | Admitting: Physical Therapy

## 2015-10-10 DIAGNOSIS — R5381 Other malaise: Secondary | ICD-10-CM

## 2015-10-10 DIAGNOSIS — M5441 Lumbago with sciatica, right side: Secondary | ICD-10-CM

## 2015-10-10 NOTE — Therapy (Signed)
[facility] [place], [place], [address] Phone: [phone]   Fax:  [phone]  Physical Therapy Treatment  Patient Details  Name: Tyrone Mitchell MRN: [id number] Date of Birth: [birth year] Referring Provider: Florinda Marker MD.  Encounter Date: 10/10/2015      PT End of Session - 10/10/15 1021    Visit Number 5   Number of Visits 12   Date for PT Re-Evaluation 11/04/15   PT Start Time 0950   PT Stop Time 1034   PT Time Calculation (min) 44 min   Activity Tolerance Patient limited by pain   Behavior During Therapy [facility] for tasks assessed/performed      Past Medical History  Diagnosis Date  . Atrial fibrillation ([place])     a. on flecainide and coumadin  . GERD (gastroesophageal reflux disease)   . Hyperlipidemia   . Anemia   . Cancer [facility]) 2010    prostate  . Atrial flutter ([place])     a. s/p TEE/DCCV 09/2012, EF 50-55% by TEE.  . Mild mitral and aortic regurgitation     a. by TEE 09/2012  . AAA (abdominal aortic aneurysm) ([place])     Abd U/S 4/14:  Infrarenal AAA 3.2 x 3.1 cm, bilat CIA mildly dilated with slight increase on L, mild aorto-iliac atherosclerosis w/o stenosis - f/u 1 year  . Complication of anesthesia     HAD DIFFICULTY WAKING AS CHILD  . Diabetes mellitus without complication ([place])   . Arthritis   . Visit for monitoring [name] therapy     Past Surgical History  Procedure Laterality Date  . Back surgery    . Hip surgery    . Cholecystectomy    . Prostatectomy    . Hernia repair    . Total hip arthroplasty  09/12/2012    Procedure: TOTAL HIP ARTHROPLASTY ANTERIOR APPROACH;  Surgeon: Mauri Pole, MD;  Location: [facility];  Service: Orthopedics;  Laterality: Left;  . Hip replacement  09/12/2012    left  . Tee without cardioversion  10/09/2012    Procedure: TRANSESOPHAGEAL ECHOCARDIOGRAM (TEE);  Surgeon: Lelon Perla, MD;  Location: [facility] ENDOSCOPY;  Service: Cardiovascular;  Laterality: N/A;   . Cardioversion  10/09/2012    Procedure: CARDIOVERSION;  Surgeon: Lelon Perla, MD;  Location: [facility] ENDOSCOPY;  Service: Cardiovascular;  Laterality: N/A;  . Tonsillectomy    . Cardioversion N/A 07/17/2014    Procedure: CARDIOVERSION;  Surgeon: Deboraha Sprang, MD;  Location: [facility] CATH LAB;  Service: Cardiovascular;  Laterality: N/A;    There were no vitals filed for this visit.  Visit Diagnosis:  Right-sided low back pain with right-sided sciatica  Debility      Subjective Assessment - 10/10/15 0946    Subjective Reports outside activity has aggravated his pain. Had no relief from last treatment. Has symptoms in LLE as well as RLE now.   Patient Stated Goals Get out of pain.   Currently in Pain? Yes   Pain Score 4    Pain Location Back   Pain Orientation Right;Lower   Pain Type Chronic pain            [facility] PT Assessment - 10/10/15 0001    Assessment   Medical Diagnosis Lumbar radicular pain.   Precautions   Precaution Comments Previous lumbar fusion.                     [facility] Adult PT Treatment/Exercise - 10/10/15 0001  Modalities   Modalities Electrical Stimulation;Moist Heat;Ultrasound   Moist Heat Therapy   Number Minutes Moist Heat 15 Minutes   Moist Heat Location Lumbar Spine   Electrical Stimulation   Electrical Stimulation Location R low back   Electrical Stimulation Action Pre-Mod   Electrical Stimulation Parameters 80-150 Hz x15 min   Electrical Stimulation Goals Pain;Tone   Ultrasound   Ultrasound Location R lumbar paraspinals/ QL   Ultrasound Parameters 1.2 w/cm2, 100%,1 mhz x10 min   Ultrasound Goals Pain   Manual Therapy   Manual Therapy Myofascial release   Myofascial Release MFR to R lumbar paraspinals/ QL to decrease tightness and pain                  PT Short Term Goals - 10/01/15 1348    PT SHORT TERM GOAL #1   Title Ind with a HEP.   Time 2   Period Weeks   Status On-going           PT Long Term Goals  - 10/10/15 1027    PT LONG TERM GOAL #1   Title Eliminate right LE pain.   Time 6   Period Weeks   Status On-going  As of 10/10/2015 pain is in BLE with RLE pain much worse than LLE.   PT LONG TERM GOAL #2   Title Perform ADL's with pain not > 3/10.   Status On-going               Plan - 10/10/15 1022    Clinical Impression Statement Patient presents with continued increased low back pain with pain in BLE although RLE is more prominent per patient report. Pain In RLE is present in the anterior thigh and pain in LLE is present from medial thigh crossing to anterior thigh with RLE pain much worse. Patient ambulates with lumbar flexion secondary to the pain and completes transition movements slowly secondary to pain and discomfort. Patient experienced improvment with symtpoms following the first 3 treatments per patient report and after completing chores around his home the pain has returned and going into LLE. R low back musculature displays much more tightness and tone when compared to L low back musculature with no tenderness reported during manual therapy. Normal modaliites response noted following removal of the modalities. No LT goals have been achieved secondary to pain experienced by patient. Has most difficulty duirng ambulation, standing after sitting, and SLS on LLE.    Pt will benefit from skilled therapeutic intervention in order to improve on the following deficits Pain;Decreased activity tolerance;Decreased range of motion   PT Frequency 2x / week   PT Duration 6 weeks   PT Treatment/Interventions ADLs/Self Care Home Management;Electrical Stimulation;Moist Heat;Ultrasound;Therapeutic activities;Therapeutic exercise;Patient/family education;Manual techniques   PT Next Visit Plan Continue extension based exercises and core strengthening with modalities PRN per MPT POC.   PT Home Exercise Plan prone lying every 2 hours x 5 min, standing extensions    Consulted and Agree with Plan  of Care Patient        Problem List Patient Active Problem List   Diagnosis Date Noted  . Back pain 08/06/2015  . Atrial fibrillation with controlled ventricular response ([place]) 07/15/2014  . Chronic anticoagulation 07/15/2014  . Atrial fibrillation, controlled ([place]) 07/15/2014  . Atrial flutter with rapid ventricular response ([place]) 10/08/2012    Class: Acute  . Acute diastolic heart failure ([place]) 10/08/2012    Class: Acute  . Expected blood loss anemia 09/13/2012  . Overweight (BMI  25.0-29.9) 09/13/2012  . S/P left THA, AA 09/12/2012  . Palpitations 10/27/2011  . Shortness of breath 10/27/2011  . AAA (abdominal aortic aneurysm) ([place]) 10/27/2011  . Wound of right leg 08/23/2011  . Status post THR (total hip replacement) 08/18/2011  . Warfarin anticoagulation 08/18/2011  . Accident caused by unspecified machinery 08/16/2011  . Multiple closed fractures of ribs of right side 08/16/2011  . Crush injury elbow/forearm 08/16/2011  . 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 Tear of medial collateral ligament of knee 08/16/2011  . HYPERLIPIDEMIA-MIXED 12/18/2008  . TOBACCO ABUSE 12/18/2008  . HYPERTENSION, BENIGN 12/18/2008  . Atrial fibrillation ([place]) 12/18/2008    Ahmed Prima, PTA 10/10/2015 12:00 PM Mali Applegate MPT [facility] [address] [place], [place], [address] Phone: [phone]   Fax:  [phone]  Name: Tyrone Mitchell MRN: [id number] Date of Birth: [birth year]

## 2015-10-15 ENCOUNTER — Ambulatory Visit: Payer: Medicare HMO | Attending: Neurosurgery | Admitting: Physical Therapy

## 2015-10-15 DIAGNOSIS — M5441 Lumbago with sciatica, right side: Secondary | ICD-10-CM | POA: Diagnosis not present

## 2015-10-15 DIAGNOSIS — R5381 Other malaise: Secondary | ICD-10-CM | POA: Diagnosis present

## 2015-10-15 NOTE — Therapy (Signed)
[facility] [place], [place], [address] Phone: [phone]   Fax:  [phone]  Physical Therapy Treatment  Patient Details  Name: Tyrone Mitchell MRN: [id number] Date of Birth: May 31, 1941 Referring Provider: Florinda Marker MD.  Encounter Date: 10/15/2015      PT End of Session - 10/15/15 1435    Visit Number 6   Number of Visits 12   Date for PT Re-Evaluation 11/04/15   PT Start Time [id number]   PT Stop Time 1530   PT Time Calculation (min) 55 min   Activity Tolerance Patient tolerated treatment well   Behavior During Therapy [facility] for tasks assessed/performed      Past Medical History  Diagnosis Date  . Atrial fibrillation ([place])     a. on flecainide and coumadin  . GERD (gastroesophageal reflux disease)   . Hyperlipidemia   . Anemia   . Cancer [facility]) 2010    prostate  . Atrial flutter ([place])     a. s/p TEE/DCCV 09/2012, EF 50-55% by TEE.  . Mild mitral and aortic regurgitation     a. by TEE 09/2012  . AAA (abdominal aortic aneurysm) ([place])     Abd U/S 4/14:  Infrarenal AAA 3.2 x 3.1 cm, bilat CIA mildly dilated with slight increase on L, mild aorto-iliac atherosclerosis w/o stenosis - f/u 1 year  . Complication of anesthesia     HAD DIFFICULTY WAKING AS CHILD  . Diabetes mellitus without complication ([place])   . Arthritis   . Visit for monitoring [name] therapy     Past Surgical History  Procedure Laterality Date  . Back surgery    . Hip surgery    . Cholecystectomy    . Prostatectomy    . Hernia repair    . Total hip arthroplasty  09/12/2012    Procedure: TOTAL HIP ARTHROPLASTY ANTERIOR APPROACH;  Surgeon: Mauri Pole, MD;  [facility]: [facility];  Service: Orthopedics;  Laterality: Left;  . Hip replacement  09/12/2012    left  . Tee without cardioversion  10/09/2012    Procedure: TRANSESOPHAGEAL ECHOCARDIOGRAM (TEE);  Surgeon: Lelon Perla, MD;  Location: [facility] ENDOSCOPY;  Service: Cardiovascular;   Laterality: N/A;  . Cardioversion  10/09/2012    Procedure: CARDIOVERSION;  Surgeon: Lelon Perla, MD;  Location: [facility] ENDOSCOPY;  Service: Cardiovascular;  Laterality: N/A;  . Tonsillectomy    . Cardioversion N/A 07/17/2014    Procedure: CARDIOVERSION;  Surgeon: Deboraha Sprang, MD;  Location: [facility] CATH LAB;  Service: Cardiovascular;  Laterality: N/A;    There were no vitals filed for this visit.  Visit Diagnosis:  Right-sided low back pain with right-sided sciatica  Debility      Subjective Assessment - 10/15/15 1436    Subjective Patient reports pain is better today but still in both legs to knee. R pain comes from top and L leg comes from groin.   Patient Stated Goals Get out of pain.   Currently in Pain? Yes   Pain Score 4    Pain Location Back   Pain Orientation Right   Pain Descriptors / Indicators Aching   Pain Type Chronic pain   Pain Radiating Towards to Bil knees                         [facility] Adult PT Treatment/Exercise - 10/15/15 0001    Lumbar Exercises: Standing   Other Standing Lumbar Exercises standing hip ABD x 5 each  way  caused cramping in B thighs   Lumbar Exercises: Supine   Bridge 10 reps  advised as alternate to standing ext   Modalities   Modalities Electrical Stimulation;Ultrasound   Acupuncturist Location B low back   Electrical Stimulation Action premod   Electrical Stimulation Parameters 80-150 Hz to tolerance x 15 min   Electrical Stimulation Goals Pain   Ultrasound   Ultrasound Location B L4/5   Ultrasound Parameters 4.15 wcm2 1 mhz cont x 10 min   Ultrasound Goals Pain   Manual Therapy   Myofascial Release to L L4/5   active TP noted                  PT Short Term Goals - 10/15/15 1546    PT SHORT TERM GOAL #1   Title Ind with a HEP.   Time 2           PT Long Term Goals - 10/15/15 1516    PT LONG TERM GOAL #1   Title Eliminate right LE pain.   Time 6   Period  Weeks   Status On-going   PT LONG TERM GOAL #2   Title Perform ADL's with pain not > 3/10.   Time 6   Period Weeks   Status On-going               Plan - 10/15/15 1517    Clinical Impression Statement Patient reports decreased pain today vs. last week, but pain remains in BLE. He has active TP in L lumbar paraspinals today. Mild trendelenberg on R with gait. He states his greatest pain is with weight shift during amb and with sit to stand. Goals are ongoing. PT continues to discuss with pt importance of modifying ADLS to avoid fleixion as well as taking frequent breaks and utilizing extension protocol, however it is unclear how compliant pt is with this.   Pt will benefit from skilled therapeutic intervention in order to improve on the following deficits Pain;Decreased activity tolerance;Decreased range of motion   PT Frequency 2x / week   PT Duration 6 weeks   PT Treatment/Interventions ADLs/Self Care Home Management;Electrical Stimulation;Moist Heat;Ultrasound;Therapeutic activities;Therapeutic exercise;Patient/family education;Manual techniques   PT Next Visit Plan If patient remains at lower pain levels progress core exercise. Assess how hip abduction is going. Continue extension based exercises and core strengthening with modalities PRN per MPT POC.   Consulted and Agree with Plan of Care Patient        Problem List Patient Active Problem List   Diagnosis Date Noted  . Back pain 08/06/2015  . Atrial fibrillation with controlled ventricular response ([place]) 07/15/2014  . Chronic anticoagulation 07/15/2014  . Atrial fibrillation, controlled ([place]) 07/15/2014  . Atrial flutter with rapid ventricular response ([place]) 10/08/2012    Class: Acute  . Acute diastolic heart failure ([place]) 10/08/2012    Class: Acute  . Expected blood loss anemia 09/13/2012  . Overweight (BMI 25.0-29.9) 09/13/2012  . S/P left THA, AA 09/12/2012  . Palpitations 10/27/2011  . Shortness of breath  10/27/2011  . AAA (abdominal aortic aneurysm) ([place]) 10/27/2011  . Wound of right leg 08/23/2011  . Status post THR (total hip replacement) 08/18/2011  . Warfarin anticoagulation 08/18/2011  . Accident caused by unspecified machinery 08/16/2011  . Multiple closed fractures of ribs of right side 08/16/2011  . Crush injury elbow/forearm 08/16/2011  . 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 Tear of  medial collateral ligament of knee 08/16/2011  . HYPERLIPIDEMIA-MIXED 12/18/2008  . TOBACCO ABUSE 12/18/2008  . HYPERTENSION, BENIGN 12/18/2008  . Atrial fibrillation ([place]) 12/18/2008   Madelyn Flavors PT  10/15/2015, 3:50 PM  [facility] [address] [place], [place], [address] Phone: [phone]   Fax:  [phone]  Name: AMOD LIQUORI MRN: [id number] Date of Birth: January 02, 1941

## 2015-10-20 ENCOUNTER — Encounter: Payer: Commercial Managed Care - HMO | Admitting: Physical Therapy

## 2015-10-23 ENCOUNTER — Ambulatory Visit: Payer: Medicare HMO | Admitting: Physical Therapy

## 2015-10-23 DIAGNOSIS — R5381 Other malaise: Secondary | ICD-10-CM

## 2015-10-23 DIAGNOSIS — M5441 Lumbago with sciatica, right side: Secondary | ICD-10-CM | POA: Diagnosis not present

## 2015-10-23 NOTE — Therapy (Signed)
[facility] [place], [place], [address] Phone: [phone]   Fax:  [phone]  Physical Therapy Treatment  Patient Details  Name: Tyrone Mitchell MRN: [id number] Date of Birth: 10/29/1940 Referring Provider: Florinda Marker MD.  Encounter Date: 10/23/2015      PT End of Session - 10/23/15 1558    Visit Number 7   Number of Visits 12   Date for PT Re-Evaluation 11/04/15   PT Start Time 0145   PT Stop Time 0237   PT Time Calculation (min) 52 min   Activity Tolerance Patient tolerated treatment well   Behavior During Therapy [facility] for tasks assessed/performed      Past Medical History  Diagnosis Date  . Atrial fibrillation ([place])     a. on flecainide and coumadin  . GERD (gastroesophageal reflux disease)   . Hyperlipidemia   . Anemia   . Cancer [facility]) 2010    prostate  . Atrial flutter ([place])     a. s/p TEE/DCCV 09/2012, EF 50-55% by TEE.  . Mild mitral and aortic regurgitation     a. by TEE 09/2012  . AAA (abdominal aortic aneurysm) ([place])     Abd U/S 4/14:  Infrarenal AAA 3.2 x 3.1 cm, bilat CIA mildly dilated with slight increase on L, mild aorto-iliac atherosclerosis w/o stenosis - f/u 1 year  . Complication of anesthesia     HAD DIFFICULTY WAKING AS CHILD  . Diabetes mellitus without complication ([place])   . Arthritis   . Visit for monitoring [name] therapy     Past Surgical History  Procedure Laterality Date  . Back surgery    . Hip surgery    . Cholecystectomy    . Prostatectomy    . Hernia repair    . Total hip arthroplasty  09/12/2012    Procedure: TOTAL HIP ARTHROPLASTY ANTERIOR APPROACH;  Surgeon: Mauri Pole, MD;  Location: [facility];  Service: Orthopedics;  Laterality: Left;  . Hip replacement  09/12/2012    left  . Tee without cardioversion  10/09/2012    Procedure: TRANSESOPHAGEAL ECHOCARDIOGRAM (TEE);  Surgeon: Lelon Perla, MD;  Location: [facility] ENDOSCOPY;  Service: Cardiovascular;   Laterality: N/A;  . Cardioversion  10/09/2012    Procedure: CARDIOVERSION;  Surgeon: Lelon Perla, MD;  Location: [facility] ENDOSCOPY;  Service: Cardiovascular;  Laterality: N/A;  . Tonsillectomy    . Cardioversion N/A 07/17/2014    Procedure: CARDIOVERSION;  Surgeon: Deboraha Sprang, MD;  Location: [facility] CATH LAB;  Service: Cardiovascular;  Laterality: N/A;    There were no vitals filed for this visit.  Visit Diagnosis:  Right-sided low back pain with right-sided sciatica  Debility      Subjective Assessment - 10/23/15 1604    Subjective I was doing good then I stood on concrete for several hours waiting to get new tires.  I sat down and then could hardly get up.  Felt like i went back to where I started.  Both sides of back and both legs hurt.   Patient Stated Goals Get out of pain.   Pain Score 7    Pain Location Back   Pain Orientation Right;Left   Pain Descriptors / Indicators Aching   Pain Type Chronic pain                         [facility] Adult PT Treatment/Exercise - 10/23/15 0001    Exercises   Exercises Knee/Hip  Lumbar Exercises: Aerobic   Stationary Bike Nustep level 3 x 10 minutes.   Moist Heat Therapy   Number Minutes Moist Heat 15 Minutes   Moist Heat Location Lumbar Spine   Electrical Stimulation   Electrical Stimulation Location Bil LB.   Electrical Stimulation Action Pre-mod   Electrical Stimulation Parameters 80-150 HZ x 15 minutes.   Electrical Stimulation Goals Pain   Ultrasound   Ultrasound Location Right sdly position with pillows between knees while patient received U/S at 1.50 W/CM2 x 13 minutes to affected bilateral lower back region.   Ultrasound Goals Pain                  PT Short Term Goals - 10/15/15 1546    PT SHORT TERM GOAL #1   Title Ind with a HEP.   Time 2           PT Long Term Goals - 10/15/15 1516    PT LONG TERM GOAL #1   Title Eliminate right LE pain.   Time 6   Period Weeks   Status On-going   PT  LONG TERM GOAL #2   Title Perform ADL's with pain not > 3/10.   Time 6   Period Weeks   Status On-going               Problem List Patient Active Problem List   Diagnosis Date Noted  . Back pain 08/06/2015  . Atrial fibrillation with controlled ventricular response ([place]) 07/15/2014  . Chronic anticoagulation 07/15/2014  . Atrial fibrillation, controlled ([place]) 07/15/2014  . Atrial flutter with rapid ventricular response ([place]) 10/08/2012    Class: Acute  . Acute diastolic heart failure ([place]) 10/08/2012    Class: Acute  . Expected blood loss anemia 09/13/2012  . Overweight (BMI 25.0-29.9) 09/13/2012  . S/P left THA, AA 09/12/2012  . Palpitations 10/27/2011  . Shortness of breath 10/27/2011  . AAA (abdominal aortic aneurysm) ([place]) 10/27/2011  . Wound of right leg 08/23/2011  . Status post THR (total hip replacement) 08/18/2011  . Warfarin anticoagulation 08/18/2011  . Accident caused by unspecified machinery 08/16/2011  . Multiple closed fractures of ribs of right side 08/16/2011  . Crush injury elbow/forearm 08/16/2011  . 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 Tear of medial collateral ligament of knee 08/16/2011  . HYPERLIPIDEMIA-MIXED 12/18/2008  . TOBACCO ABUSE 12/18/2008  . HYPERTENSION, BENIGN 12/18/2008  . Atrial fibrillation ([place]) 12/18/2008    Wilho Sharpley, [place] MPT 10/23/2015, 4:20 PM  [facility] [address] [place], [place], [address] Phone: [phone]   Fax:  [phone]  Name: Tyrone Mitchell MRN: [id number] Date of Birth: [birth year]

## 2015-10-27 ENCOUNTER — Encounter: Payer: Self-pay | Admitting: Physical Therapy

## 2015-10-27 ENCOUNTER — Ambulatory Visit: Payer: Medicare HMO | Admitting: Physical Therapy

## 2015-10-27 DIAGNOSIS — R5381 Other malaise: Secondary | ICD-10-CM

## 2015-10-27 DIAGNOSIS — M5441 Lumbago with sciatica, right side: Secondary | ICD-10-CM | POA: Diagnosis not present

## 2015-10-27 NOTE — Patient Instructions (Signed)
Bridging    Slowly raise buttocks from floor, keeping stomach tight. Repeat __10__ times per set. Do _2___ sets per session. Do __2__ sessions per day.  http://orth.exer.us/1096   Copyright  VHI. All rights reserved.  Wall Slide    Keep head, shoulders, and back against wall, with feet out in front and slightly wider than shoulder width. Slowly lower buttocks by sliding down wall until thighs are parallel to floor. Keep back flat. Repeat __10__ times per set. Do _1___ sets per session. Do _2___ sessions per day.  http://orth.exer.us/152   Copyright  VHI. All rights reserved.

## 2015-10-27 NOTE — Therapy (Signed)
[facility] [place], [place], [address] Phone: [phone]   Fax:  [phone]  Physical Therapy Treatment  Patient Details  Name: Tyrone Mitchell MRN: [id number] Date of Birth: 1941/01/18 Referring Provider: Florinda Marker MD.  Encounter Date: 10/27/2015      PT End of Session - 10/27/15 1452    Visit Number 8   Number of Visits 12   Date for PT Re-Evaluation 11/04/15   PT Start Time 1435   PT Stop Time 1514   PT Time Calculation (min) 39 min   Activity Tolerance Patient limited by pain   Behavior During Therapy [facility] for tasks assessed/performed      Past Medical History  Diagnosis Date  . Atrial fibrillation ([place])     a. on flecainide and coumadin  . GERD (gastroesophageal reflux disease)   . Hyperlipidemia   . Anemia   . Cancer [facility]) 2010    prostate  . Atrial flutter ([place])     a. s/p TEE/DCCV 09/2012, EF 50-55% by TEE.  . Mild mitral and aortic regurgitation     a. by TEE 09/2012  . AAA (abdominal aortic aneurysm) ([place])     Abd U/S 4/14:  Infrarenal AAA 3.2 x 3.1 cm, bilat CIA mildly dilated with slight increase on L, mild aorto-iliac atherosclerosis w/o stenosis - f/u 1 year  . Complication of anesthesia     HAD DIFFICULTY WAKING AS CHILD  . Diabetes mellitus without complication ([place])   . Arthritis   . Visit for monitoring [name] therapy     Past Surgical History  Procedure Laterality Date  . Back surgery    . Hip surgery    . Cholecystectomy    . Prostatectomy    . Hernia repair    . Total hip arthroplasty  09/12/2012    Procedure: TOTAL HIP ARTHROPLASTY ANTERIOR APPROACH;  Surgeon: Mauri Pole, MD;  [facility]: [facility];  Service: Orthopedics;  Laterality: Left;  . Hip replacement  09/12/2012    left  . Tee without cardioversion  10/09/2012    Procedure: TRANSESOPHAGEAL ECHOCARDIOGRAM (TEE);  Surgeon: Lelon Perla, MD;  Location: [facility] ENDOSCOPY;  Service: Cardiovascular;  Laterality: N/A;   . Cardioversion  10/09/2012    Procedure: CARDIOVERSION;  Surgeon: Lelon Perla, MD;  Location[facility] ENDOSCOPY;  Service: Cardiovascular;  Laterality: N/A;  . Tonsillectomy    . Cardioversion N/A 07/17/2014    Procedure: CARDIOVERSION;  Surgeon: Deboraha Sprang, MD;  Location: [facility] CATH LAB;  Service: Cardiovascular;  Laterality: N/A;    There were no vitals filed for this visit.  Visit Diagnosis:  Right-sided low back pain with right-sided sciatica  Debility      Subjective Assessment - 10/27/15 1437    Subjective Reports that he is having a better day today. Reports that pain today is just in his low back but still has pain into both hips at times.   Patient Stated Goals Get out of pain.   Currently in Pain? Yes   Pain Score 3    Pain Location Back   Pain Orientation Left;Right;Lower   Pain Descriptors / Indicators Aching   Pain Type Chronic pain            [facility] PT Assessment - 10/27/15 0001    Assessment   Medical Diagnosis Lumbar radicular pain.   Precautions   Precaution Comments Previous lumbar fusion.  [place] Adult PT Treatment/Exercise - 10/27/15 0001    Lumbar Exercises: Stretches   Single Knee to Chest Stretch 3 reps;30 seconds;Other (comment)  BLE   Lumbar Exercises: Aerobic   Stationary Bike Nustep level 3 x 10 minutes.   Lumbar Exercises: Standing   Wall Slides 10 reps   Other Standing Lumbar Exercises Standing R hip abduction x5 reps   Patient reported hip discomfort   Lumbar Exercises: Supine   Bridge 20 reps;3 seconds   Straight Leg Raise 5 reps;Other (comment)  Reported cramping in B Quads                PT Education - 10/27/15 1813    Education provided Yes   Education Details HEP- wall squats, bridging   Person(s) Educated Patient   Methods Explanation;Verbal cues;Handout;Demonstration   Comprehension Verbalized understanding;Returned demonstration;Verbal cues required          PT Short Term  Goals - 10/27/15 1438    PT SHORT TERM GOAL #1   Title Ind with a HEP.   Time 2   Period Weeks   Status On-going           PT Long Term Goals - 10/15/15 1516    PT LONG TERM GOAL #1   Title Eliminate right LE pain.   Time 6   Period Weeks   Status On-going   PT LONG TERM GOAL #2   Title Perform ADL's with pain not > 3/10.   Time 6   Period Weeks   Status On-going               Plan - 10/27/15 1814    Clinical Impression Statement Patient tolerated today's treatment fairly well although today he experienced cramping sensation in BLE and difficulty with ambulation following activity. Patient expressed uncertainty regarding visits with MD and how to progress even though he continues to have increased pain. Continues to have difficulty with SKTC stretch in flexing hips with complete the stretch and had discomfort in hip joints per patient report with stretch. Patient was educated to attempt HEP and have better posture with activities. Patient also educated to rest as he needs to following activites to prevent excessive pain.    Pt will benefit from skilled therapeutic intervention in order to improve on the following deficits Pain;Decreased activity tolerance;Decreased range of motion   PT Frequency 2x / week   PT Duration 6 weeks   PT Treatment/Interventions ADLs/Self Care Home Management;Electrical Stimulation;Moist Heat;Ultrasound;Therapeutic activities;Therapeutic exercise;Patient/family education;Manual techniques   PT Next Visit Plan If patient remains at lower pain levels progress core exercise. Assess how hip abduction is going. Continue extension based exercises and core strengthening with modalities PRN per MPT POC.   PT Home Exercise Plan prone lying every 2 hours x 5 min, standing extensions; bridging, wall squat   Consulted and Agree with Plan of Care Patient        Problem List Patient Active Problem List   Diagnosis Date Noted  . Back pain 08/06/2015  .  Atrial fibrillation with controlled ventricular response ([place]) 07/15/2014  . Chronic anticoagulation 07/15/2014  . Atrial fibrillation, controlled ([place]) 07/15/2014  . Atrial flutter with rapid ventricular response ([place]) 10/08/2012    Class: Acute  . Acute diastolic heart failure ([place]) 10/08/2012    Class: Acute  . Expected blood loss anemia 09/13/2012  . Overweight (BMI 25.0-29.9) 09/13/2012  . S/P left THA, AA 09/12/2012  . Palpitations 10/27/2011  . Shortness of breath 10/27/2011  .  AAA (abdominal aortic aneurysm) ([place]) 10/27/2011  . Wound of right leg 08/23/2011  . Status post THR (total hip replacement) 08/18/2011  . Warfarin anticoagulation 08/18/2011  . Accident caused by unspecified machinery 08/16/2011  . Multiple closed fractures of ribs of right side 08/16/2011  . Crush injury elbow/forearm 08/16/2011  . 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 Tear of medial collateral ligament of knee 08/16/2011  . HYPERLIPIDEMIA-MIXED 12/18/2008  . TOBACCO ABUSE 12/18/2008  . HYPERTENSION, BENIGN 12/18/2008  . Atrial fibrillation ([place]) 12/18/2008    Wynelle Fanny, PTA 10/27/2015, 6:19 PM  [facility] [address] [place], [place], [address] Phone: [phone]   Fax:  [phone]  Name: Tyrone Mitchell MRN: [id number] Date of Birth: 10-08-1941

## 2015-10-30 ENCOUNTER — Encounter: Payer: Self-pay | Admitting: Physical Therapy

## 2015-10-30 ENCOUNTER — Ambulatory Visit: Payer: Medicare HMO | Admitting: Physical Therapy

## 2015-10-30 DIAGNOSIS — M5441 Lumbago with sciatica, right side: Secondary | ICD-10-CM | POA: Diagnosis not present

## 2015-10-30 DIAGNOSIS — R5381 Other malaise: Secondary | ICD-10-CM

## 2015-10-30 NOTE — Therapy (Addendum)
[facility] [place], [place], [address] Phone: [phone]   Fax:  [phone]  Physical Therapy Treatment  Patient Details  Name: Tyrone Mitchell MRN: [id number] Date of Birth: [birth year] Referring Provider: Florinda Marker MD.  Encounter Date: 10/30/2015      PT End of Session - 10/30/15 1438    Visit Number 9   Number of Visits 12   Date for PT Re-Evaluation 11/04/15   PT Start Time 5643   PT Stop Time 1529   PT Time Calculation (min) 52 min   Activity Tolerance Patient tolerated treatment well;Patient limited by pain   Behavior During Therapy [facility] for tasks assessed/performed      Past Medical History  Diagnosis Date  . Atrial fibrillation ([place])     a. on flecainide and coumadin  . GERD (gastroesophageal reflux disease)   . Hyperlipidemia   . Anemia   . Cancer [facility]) 2010    prostate  . Atrial flutter ([place])     a. s/p TEE/DCCV 09/2012, EF 50-55% by TEE.  . Mild mitral and aortic regurgitation     a. by TEE 09/2012  . AAA (abdominal aortic aneurysm) ([place])     Abd U/S 4/14:  Infrarenal AAA 3.2 x 3.1 cm, bilat CIA mildly dilated with slight increase on L, mild aorto-iliac atherosclerosis w/o stenosis - f/u 1 year  . Complication of anesthesia     HAD DIFFICULTY WAKING AS CHILD  . Diabetes mellitus without complication ([place])   . Arthritis   . Visit for monitoring [name] therapy     Past Surgical History  Procedure Laterality Date  . Back surgery    . Hip surgery    . Cholecystectomy    . Prostatectomy    . Hernia repair    . Total hip arthroplasty  09/12/2012    Procedure: TOTAL HIP ARTHROPLASTY ANTERIOR APPROACH;  Surgeon: Mauri Pole, MD;  [facility];  Service: Orthopedics;  Laterality: Left;  . Hip replacement  09/12/2012    left  . Tee without cardioversion  10/09/2012    Procedure: TRANSESOPHAGEAL ECHOCARDIOGRAM (TEE);  Surgeon: Lelon Perla, MD;  Location: [facility] ENDOSCOPY;  Service:  Cardiovascular;  Laterality: N/A;  . Cardioversion  10/09/2012    Procedure: CARDIOVERSION;  Surgeon: Lelon Perla, MD;  Location: [facility] ENDOSCOPY;  Service: Cardiovascular;  Laterality: N/A;  . Tonsillectomy    . Cardioversion N/A 07/17/2014    Procedure: CARDIOVERSION;  Surgeon: Deboraha Sprang, MD;  Location: [facility] CATH LAB;  Service: Cardiovascular;  Laterality: N/A;    There were no vitals filed for this visit.  Visit Diagnosis:  Right-sided low back pain with right-sided sciatica  Debility      Subjective Assessment - 10/30/15 1434    Subjective Reports that B calves are sore today and that low back is about the same. Reports that pain got up to 8/10 this morning by just fixing his breakfast and washing dishes.   Patient Stated Goals Get out of pain.   Currently in Pain? Yes   Pain Score 4    Pain Location Back   Pain Orientation Lower   Pain Descriptors / Indicators Aching   Pain Type Chronic pain   Pain Radiating Towards to B knees; R more than L            [facility] PT Assessment - 10/30/15 0001    Assessment   Medical Diagnosis Lumbar radicular pain.   Precautions   Precaution  Comments Previous lumbar fusion.                     [place] Adult PT Treatment/Exercise - 10/30/15 0001    Lumbar Exercises: Stretches   Single Knee to Chest Stretch 2 reps;30 seconds;Other (comment)  BLE   Lumbar Exercises: Aerobic   Stationary Bike L1 x12 min  VCs for core activation/ proper position   Lumbar Exercises: Standing   Row Strengthening;Both;20 reps;Theraband  Pink XTS   Shoulder Extension Strengthening;Both;20 reps;Theraband  Pink XTS   Other Standing Lumbar Exercises Attempted standing stretch for low back with foot on higher surface but no sufficient stretch   Lumbar Exercises: Supine   Clam 20 reps;Other (comment)  red theraband   Bent Knee Raise 15 reps;Other (comment)  BLE with VCs for core activation   Bridge 20 reps   Straight Leg Raise 10 reps;Other  (comment)  BLE; LLE cramping >RLE   Other Supine Lumbar Exercises Bridge with red theraband clamshell x5 reps; d/c due to hip pain   Lumbar Exercises: Sidelying   Hip Abduction 10 reps;Other (comment)  BLE; discomfort with R hip and pinch in R low back                   PT Short Term Goals - 10/30/15 1445    PT SHORT TERM GOAL #1   Title Ind with a HEP.   Time 2   Period Weeks   Status Achieved           PT Long Term Goals - 10/15/15 1516    PT LONG TERM GOAL #1   Title Eliminate right LE pain.   Time 6   Period Weeks   Status On-going   PT LONG TERM GOAL #2   Title Perform ADL's with pain not > 3/10.   Time 6   Period Weeks   Status On-going               Plan - 10/30/15 1539    Clinical Impression Statement Patient tolerated today's treatment fairly well although his low back pain remained 3-4/10 upon end of treatment. Patient still discouraged regarding contact with MD and how to proceed regarding his back pain. Patient experienced sharp low back pain following stationary bike today. RLE pain radiates in anterior R thigh to knee and LLE pain radiates from inner thigh to anterior thigh. Cramping or hip discomfort was encountered with various supine exercises with LLE cramping worse than RLE. Patient continues to experience low back tightness but states that he finds he doesn't get sufficient stretch on soft surface or has difficulty rising from floor at home. Standing stretch attempted but patient had difficulty due to hip mobility and discomfort and position to feel stretch put him into lumbar flexion.    Pt will benefit from skilled therapeutic intervention in order to improve on the following deficits Pain;Decreased activity tolerance;Decreased range of motion   PT Frequency 2x / week   PT Duration 6 weeks   PT Treatment/Interventions ADLs/Self Care Home Management;Electrical Stimulation;Moist Heat;Ultrasound;Therapeutic activities;Therapeutic  exercise;Patient/family education;Manual techniques   PT Next Visit Plan Continue extension based exercises and core strengthening with modalities PRN per MPT POC. Research standing lumbar stretch for patient's home use.   PT Home Exercise Plan prone lying every 2 hours x 5 min, standing extensions; bridging, wall squat   Consulted and Agree with Plan of Care Patient        Problem List Patient Active Problem List  Diagnosis Date Noted  . Back pain 08/06/2015  . Atrial fibrillation with controlled ventricular response ([place]) 07/15/2014  . Chronic anticoagulation 07/15/2014  . Atrial fibrillation, controlled ([place]) 07/15/2014  . Atrial flutter with rapid ventricular response ([place]) 10/08/2012    Class: Acute  . Acute diastolic heart failure ([place]) 10/08/2012    Class: Acute  . Expected blood loss anemia 09/13/2012  . Overweight (BMI 25.0-29.9) 09/13/2012  . S/P left THA, AA 09/12/2012  . Palpitations 10/27/2011  . Shortness of breath 10/27/2011  . AAA (abdominal aortic aneurysm) ([place]) 10/27/2011  . Wound of right leg 08/23/2011  . Status post THR (total hip replacement) 08/18/2011  . Warfarin anticoagulation 08/18/2011  . Accident caused by unspecified machinery 08/16/2011  . Multiple closed fractures of ribs of right side 08/16/2011  . Crush injury elbow/forearm 08/16/2011  . 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 Tear of medial collateral ligament of knee 08/16/2011  . HYPERLIPIDEMIA-MIXED 12/18/2008  . TOBACCO ABUSE 12/18/2008  . HYPERTENSION, BENIGN 12/18/2008  . Atrial fibrillation ([place]) 12/18/2008    Wynelle Fanny, PTA 10/30/2015, 3:49 PM  [facility] [place], [place], [address] Phone: [phone]   Fax:  [phone]  Name: Tyrone Mitchell MRN: [id number] Date of Birth: 1941-02-06  PHYSICAL THERAPY DISCHARGE SUMMARY  Visits from Start of Care: 9.  Current functional level  related to goals / functional outcomes: See above.   Remaining deficits: Continued pain.   Education / Equipment: HEP. Plan: Patient agrees to discharge.  Patient goals were not met. Patient is being discharged due to lack of progress.  ?????         Mali Applegate MPT

## 2015-11-03 ENCOUNTER — Ambulatory Visit: Payer: Medicare HMO | Admitting: Physical Therapy

## 2015-11-06 ENCOUNTER — Encounter: Payer: Medicare HMO | Admitting: Physical Therapy

## 2016-05-24 ENCOUNTER — Other Ambulatory Visit: Payer: Self-pay | Admitting: Internal Medicine

## 2016-06-28 ENCOUNTER — Telehealth: Payer: Self-pay | Admitting: Internal Medicine

## 2016-06-28 NOTE — Telephone Encounter (Signed)
Pt takes Xarelto for afib with CHADS2 score of 3 (DM, HTN, HF), no history of stroke. Ok to hold Xarelto for 3 days prior to spinal injection per protocol. Clearance faxed to [phone].

## 2016-06-28 NOTE — Telephone Encounter (Signed)
Request for surgical clearance:  1. What type of surgery is being performed? Lumbar Epidural  2. When is this surgery scheduled? Waiting to schedule   3. Are there any medications that need to be held prior to surgery and how long?Can pt stop Xarelto for 3 days?  4. Name of physician performing surgery? Dr Gaylene Brooks   5. What is your office phone and fax number? [phone] and fax number is [phone]

## 2016-08-11 ENCOUNTER — Other Ambulatory Visit (HOSPITAL_COMMUNITY): Payer: Self-pay | Admitting: Adult Health Nurse Practitioner

## 2016-08-11 ENCOUNTER — Ambulatory Visit (HOSPITAL_COMMUNITY)
Admission: RE | Admit: 2016-08-11 | Discharge: 2016-08-11 | Disposition: A | Discharge status: Home / Self Care | Payer: Medicare HMO | Source: Ambulatory Visit | Attending: Adult Health Nurse Practitioner | Admitting: Adult Health Nurse Practitioner

## 2016-08-11 DIAGNOSIS — I700 Atherosclerosis of aorta: Secondary | ICD-10-CM | POA: Insufficient documentation

## 2016-08-11 DIAGNOSIS — R05 Cough: Secondary | ICD-10-CM | POA: Insufficient documentation

## 2016-08-11 DIAGNOSIS — R053 Chronic cough: Secondary | ICD-10-CM

## 2016-08-11 DIAGNOSIS — Z87891 Personal history of nicotine dependence: Secondary | ICD-10-CM | POA: Diagnosis not present

## 2016-08-30 ENCOUNTER — Other Ambulatory Visit: Payer: Self-pay | Admitting: Internal Medicine

## 2016-09-15 ENCOUNTER — Ambulatory Visit (INDEPENDENT_AMBULATORY_CARE_PROVIDER_SITE_OTHER): Payer: Medicare HMO | Admitting: Internal Medicine

## 2016-09-15 ENCOUNTER — Encounter: Payer: Self-pay | Admitting: Internal Medicine

## 2016-09-15 VITALS — BP 126/80 | HR 76 | Ht 74.0 in | Wt 202.0 lb

## 2016-09-15 DIAGNOSIS — R9431 Abnormal electrocardiogram [ECG] [EKG]: Secondary | ICD-10-CM

## 2016-09-15 DIAGNOSIS — I481 Persistent atrial fibrillation: Secondary | ICD-10-CM | POA: Diagnosis not present

## 2016-09-15 DIAGNOSIS — I4819 Other persistent atrial fibrillation: Secondary | ICD-10-CM

## 2016-09-15 DIAGNOSIS — R0789 Other chest pain: Secondary | ICD-10-CM | POA: Diagnosis not present

## 2016-09-15 DIAGNOSIS — R0602 Shortness of breath: Secondary | ICD-10-CM | POA: Diagnosis not present

## 2016-09-15 NOTE — Patient Instructions (Addendum)
Medication Instructions:  Your physician recommends that you continue on your current medications as directed. Please refer to the Current Medication list given to you today.    Labwork: None Ordered   Testing/Procedures. Stress myoview   Follow-Up: Follow-up to be determined after myoview results.  Any Other Special Instructions Will Be Listed Below (If Applicable).     If you need a refill on your cardiac medications before your next appointment, please call your pharmacy.

## 2016-09-15 NOTE — Progress Notes (Signed)
HPI [name] returns today for followup. He is a pleasant 75 yo man with a h/o persistent atrial fibrillation, who returns today for followup. The patient has done well now that his ventricular rate is under better control. . He has minimal palpitations. He checks his heart rate at home, and notes that his rates are between 70 and 100 bpm. He complains today of sob with exertion and chest pressure. The pressure is not always associated with exertion. He has a very abnormal ECG.  Allergies  Allergen Reactions  . Penicillins Anaphylaxis    Swelling of the mouth     Current Outpatient Prescriptions  Medication Sig Dispense Refill  . ARTIFICIAL TEAR SOLUTION OP Place 1 drop into both eyes 4 (four) times daily.    . digoxin (DIGOX) 0.125 MG tablet Take 1 tablet (0.125 mg total) by mouth daily. 90 tablet 0  . fenofibrate 160 MG tablet Take 160 mg by mouth daily.    . fluticasone (FLONASE) 50 MCG/ACT nasal spray Place 2 sprays into both nostrils daily.    Marland Kitchen GLIPIZIDE XL 10 MG 24 hr tablet Take 1 tablet by mouth 2 (two) times daily.    . JENTADUETO 2.5-500 MG TABS Take 1 tablet by mouth daily.    . metoprolol tartrate (LOPRESSOR) 25 MG tablet TAKE  (1)  TABLET TWICE A DAY. 180 tablet 0  . ranitidine (ZANTAC) 150 MG tablet Take 150 mg by mouth 2 (two) times daily.    . rivaroxaban (XARELTO) 20 MG TABS tablet Take 20 mg by mouth daily.    . simvastatin (ZOCOR) 20 MG tablet Take 1 tablet by mouth daily.     No current facility-administered medications for this visit.      Past Medical History:  Diagnosis Date  . AAA (abdominal aortic aneurysm) ([place])    Abd U/S 4/14:  Infrarenal AAA 3.2 x 3.1 cm, bilat CIA mildly dilated with slight increase on L, mild aorto-iliac atherosclerosis w/o stenosis - f/u 1 year  . Anemia   . Arthritis   . Atrial fibrillation ([place])    a. on flecainide and coumadin  . Atrial flutter ([place])    a. s/p TEE/DCCV 09/2012, EF 50-55% by TEE.  . Cancer [facility])  2010   prostate  . Complication of anesthesia    HAD DIFFICULTY WAKING AS CHILD  . Diabetes mellitus without complication ([place])   . GERD (gastroesophageal reflux disease)   . Hyperlipidemia   . Mild mitral and aortic regurgitation    a. by TEE 09/2012  . Visit for monitoring [name] therapy     ROS:   All systems reviewed and negative except as noted in the HPI.   Past Surgical History:  Procedure Laterality Date  . BACK SURGERY    . CARDIOVERSION  10/09/2012   Procedure: CARDIOVERSION;  Surgeon: Lelon Perla, MD;  Location[facility] ENDOSCOPY;  Service: Cardiovascular;  Laterality: N/A;  . CARDIOVERSION N/A 07/17/2014   Procedure: CARDIOVERSION;  Surgeon: Deboraha Sprang, MD;  Location: [facility] CATH LAB;  Service: Cardiovascular;  Laterality: N/A;  . CHOLECYSTECTOMY    . HERNIA REPAIR    . hip replacement  09/12/2012   left  . HIP SURGERY    . PROSTATECTOMY    . TEE WITHOUT CARDIOVERSION  10/09/2012   Procedure: TRANSESOPHAGEAL ECHOCARDIOGRAM (TEE);  Surgeon: Lelon Perla, MD;  Location: [facility] ENDOSCOPY;  Service: Cardiovascular;  Laterality: N/A;  . TONSILLECTOMY    . TOTAL HIP ARTHROPLASTY  09/12/2012   Procedure: TOTAL HIP ARTHROPLASTY ANTERIOR APPROACH;  Surgeon: Mauri Pole, MD;  Location: [facility] ORS;  Service: Orthopedics;  Laterality: Left;     No family history on file.   Social History   Social History  . Marital status: Divorced    Spouse name: N/A  . Number of children: N/A  . Years of education: N/A   Occupational History  . Not on file.   Social History Main Topics  . Smoking status: Former Smoker    Types: Cigars    Quit date: 08/27/2011  . Smokeless tobacco: Never Used  . Alcohol use No  . Drug use: No  . Sexual activity: Not on file   Other Topics Concern  . Not on file   Social History Narrative  . No narrative on file     BP 126/80   Pulse 76   Ht 6\' 2"  (1.88 m)   Wt 202 lb (91.6 kg)   BMI 25.94 kg/m   Physical Exam:  stable  appearing 75 yo man, NAD HEENT: Unremarkable Neck:  N6 cm JVD, no thyromegally Back:  No CVA tenderness Lungs:  Clear with no wheezes HEART:  irregular rhythm, no murmurs, no rubs, no clicks Abd:  soft, positive bowel sounds, no organomegally, no rebound, no guarding Ext:  2 plus pulses, no edema, no cyanosis, no clubbing Skin:  No rashes no nodules Neuro:  CN II through XII intact, motor grossly intact  EKG - atrial fibrillation with a controlled VR and marked STT changes.   Assess/Plan: 1. Atrial fib - his rate appears to be controlled. I would like him to undergo exercising testing to assess his rate control. 2. Chest pressure and sob - I have recommended he undergo stress myoview. He has multiple cardiac risk factors and a very abnormal ECG with marked STT changes. 3. HTN - his blood pressure is well controlled on medical therapy. 4. Coags - he has had no bleeding or falls on Xarelto.  Mikle Bosworth.D.

## 2016-09-23 ENCOUNTER — Telehealth (HOSPITAL_COMMUNITY): Payer: Self-pay | Admitting: *Deleted

## 2016-09-23 NOTE — Telephone Encounter (Signed)
Patient given detailed instructions per Myocardial Perfusion Study Information Sheet for the test on 09/27/16. Patient notified to arrive 15 minutes early and that it is imperative to arrive on time for appointment to keep from having the test rescheduled.  If you need to cancel or reschedule your appointment, please call the office within 24 hours of your appointment. Failure to do so may result in a cancellation of your appointment, and a $50 no show fee. Patient verbalized understanding Kirstie Peri

## 2016-09-27 ENCOUNTER — Ambulatory Visit (HOSPITAL_COMMUNITY): Payer: Medicare HMO | Attending: Internal Medicine

## 2016-09-27 DIAGNOSIS — R0789 Other chest pain: Secondary | ICD-10-CM

## 2016-09-27 DIAGNOSIS — R9431 Abnormal electrocardiogram [ECG] [EKG]: Secondary | ICD-10-CM

## 2016-09-27 DIAGNOSIS — R9439 Abnormal result of other cardiovascular function study: Secondary | ICD-10-CM | POA: Diagnosis not present

## 2016-09-27 DIAGNOSIS — R0602 Shortness of breath: Secondary | ICD-10-CM | POA: Diagnosis not present

## 2016-09-27 LAB — MYOCARDIAL PERFUSION IMAGING
CHL CUP NUCLEAR SSS: 7
CSEPED: 4 min
CSEPEDS: 0 s
CSEPHR: 118 %
Estimated workload: 4.6 METS
MPHR: 146 {beats}/min
NUC STRESS TID: 1.15
Peak HR: 173 {beats}/min
RATE: 0.25
Rest HR: 100 {beats}/min
SDS: 2
SRS: 5

## 2016-09-27 MED ORDER — TECHNETIUM TC 99M TETROFOSMIN IV KIT
10.4000 | PACK | Freq: Once | INTRAVENOUS | Status: AC | PRN
  Administered 2016-09-27: 10.4 via INTRAVENOUS
  Filled 2016-09-27: qty 11

## 2016-09-27 MED ORDER — TECHNETIUM TC 99M TETROFOSMIN IV KIT
30.9000 | PACK | Freq: Once | INTRAVENOUS | Status: AC | PRN
  Administered 2016-09-27: 30.9 via INTRAVENOUS
  Filled 2016-09-27: qty 31

## 2016-10-01 ENCOUNTER — Telehealth: Payer: Self-pay | Admitting: Internal Medicine

## 2016-10-01 NOTE — Telephone Encounter (Signed)
Called, left voice message. Informed Dr. Lovena Le has not yet reviewed results of recent stress test. Informed I will call pt as soon as Dr. Lovena Le reviews and makes recommendations.

## 2016-10-01 NOTE — Telephone Encounter (Signed)
Follow Up   Patient calling to follow up on results of stress test done on Monday.

## 2016-10-19 ENCOUNTER — Ambulatory Visit (INDEPENDENT_AMBULATORY_CARE_PROVIDER_SITE_OTHER): Payer: Medicare HMO | Admitting: Internal Medicine

## 2016-10-19 ENCOUNTER — Encounter: Payer: Self-pay | Admitting: Internal Medicine

## 2016-10-19 ENCOUNTER — Encounter (INDEPENDENT_AMBULATORY_CARE_PROVIDER_SITE_OTHER): Payer: Self-pay

## 2016-10-19 VITALS — BP 126/82 | HR 86 | Ht 74.0 in | Wt 207.6 lb

## 2016-10-19 DIAGNOSIS — R079 Chest pain, unspecified: Secondary | ICD-10-CM | POA: Diagnosis not present

## 2016-10-19 DIAGNOSIS — Z01812 Encounter for preprocedural laboratory examination: Secondary | ICD-10-CM

## 2016-10-19 NOTE — Progress Notes (Signed)
HPI [name] returns today for followup. He is a pleasant 76 yo man with a h/o persistent atrial fibrillation, who I saw several weeks ago when he was complaining of sob and chest pressure. He undewent stress testing with multiple areas of ischemia and he returns to discuss the treatment options. The patient denies syncope. He does have chest pressure which is at times related to exertion. He gets sob with exercise as well.   Allergies  Allergen Reactions  . Penicillins Anaphylaxis    Swelling of the mouth     Current Outpatient Prescriptions  Medication Sig Dispense Refill  . acetaminophen (TYLENOL) 500 MG tablet Take 500 mg by mouth 2 (two) times daily.    . ARTIFICIAL TEAR SOLUTION OP Place 1 drop into both eyes 4 (four) times daily.    . cycloSPORINE (RESTASIS) 0.05 % ophthalmic emulsion Place 1 drop into both eyes 2 (two) times daily.    . digoxin (DIGOX) 0.125 MG tablet Take 1 tablet (0.125 mg total) by mouth daily. 90 tablet 0  . fenofibrate 160 MG tablet Take 160 mg by mouth daily.    . fluticasone (FLONASE) 50 MCG/ACT nasal spray Place 2 sprays into both nostrils daily.    Marland Kitchen GLIPIZIDE XL 10 MG 24 hr tablet Take 1 tablet by mouth 2 (two) times daily.    . JENTADUETO 2.5-500 MG TABS Take 1 tablet by mouth daily.    . metoprolol tartrate (LOPRESSOR) 25 MG tablet Take 25 mg by mouth 2 (two) times daily.    . ranitidine (ZANTAC) 150 MG tablet Take 150 mg by mouth 2 (two) times daily.    . rivaroxaban (XARELTO) 20 MG TABS tablet Take 20 mg by mouth daily.    . simvastatin (ZOCOR) 20 MG tablet Take 1 tablet by mouth daily.    . TRADJENTA 5 MG TABS tablet Take 5 mg by mouth daily.     No current facility-administered medications for this visit.      Past Medical History:  Diagnosis Date  . AAA (abdominal aortic aneurysm) ([place])    Abd U/S 4/14:  Infrarenal AAA 3.2 x 3.1 cm, bilat CIA mildly dilated with slight increase on L, mild aorto-iliac atherosclerosis w/o stenosis  - f/u 1 year  . Anemia   . Arthritis   . Atrial fibrillation ([place])    a. on flecainide and coumadin  . Atrial flutter ([place])    a. s/p TEE/DCCV 09/2012, EF 50-55% by TEE.  . Cancer [facility]) 2010   prostate  . Complication of anesthesia    HAD DIFFICULTY WAKING AS CHILD  . Diabetes mellitus without complication ([place])   . GERD (gastroesophageal reflux disease)   . Hyperlipidemia   . Mild mitral and aortic regurgitation    a. by TEE 09/2012  . Visit for monitoring [name] therapy     ROS:   All systems reviewed and negative except as noted in the HPI.   Past Surgical History:  Procedure Laterality Date  . BACK SURGERY    . CARDIOVERSION  10/09/2012   Procedure: CARDIOVERSION;  Surgeon: Lelon Perla, MD;  Location: [facility] ENDOSCOPY;  Service: Cardiovascular;  Laterality: N/A;  . CARDIOVERSION N/A 07/17/2014   Procedure: CARDIOVERSION;  Surgeon: Deboraha Sprang, MD;  Location[facility] CATH LAB;  Service: Cardiovascular;  Laterality: N/A;  . CHOLECYSTECTOMY    . HERNIA REPAIR    . hip replacement  09/12/2012   left  . HIP SURGERY    .  PROSTATECTOMY    . TEE WITHOUT CARDIOVERSION  10/09/2012   Procedure: TRANSESOPHAGEAL ECHOCARDIOGRAM (TEE);  Surgeon: Lelon Perla, MD;  Location: [facility] ENDOSCOPY;  Service: Cardiovascular;  Laterality: N/A;  . TONSILLECTOMY    . TOTAL HIP ARTHROPLASTY  09/12/2012   Procedure: TOTAL HIP ARTHROPLASTY ANTERIOR APPROACH;  Surgeon: Mauri Pole, MD;  Location: [facility] ORS;  Service: Orthopedics;  Laterality: Left;     No family history on file.   Social History   Social History  . Marital status: Divorced    Spouse name: N/A  . Number of children: N/A  . Years of education: N/A   Occupational History  . Not on file.   Social History Main Topics  . Smoking status: Former Smoker    Types: Cigars    Quit date: 08/27/2011  . Smokeless tobacco: Never Used  . Alcohol use No  . Drug use: No  . Sexual activity: Not on file   Other Topics Concern  .  Not on file   Social History Narrative  . No narrative on file     BP 126/82 (BP Location: Left Arm)   Pulse 86   Ht 6\' 2"  (1.88 m)   Wt 207 lb 9.6 oz (94.2 kg)   BMI 26.65 kg/m   Physical Exam:  stable appearing 76 yo man, NAD HEENT: Unremarkable Neck:  N6 cm JVD, no thyromegally Back:  No CVA tenderness Lungs:  Clear with no wheezes HEART:  irregular rhythm, no murmurs, no rubs, no clicks Abd:  soft, positive bowel sounds, no organomegally, no rebound, no guarding Ext:  2 plus pulses, no edema, no cyanosis, no clubbing Skin:  No rashes no nodules Neuro:  CN II through XII intact, motor grossly intact  EKG - atrial fibrillation with a controlled VR and marked STT changes.   Assess/Plan: 1. Atrial fib - his rate appears to be controlled. He will continue his current meds. Will uptitrate his beta blocker after his heart cath 2. Chest pressure and sob with ECG changes - I have recommended he undergo cardiac cath after an intermediate risk stress test. 3. HTN - his blood pressure is well controlled on medical therapy. 4. Coags - he has had no bleeding or falls on Xarelto. He will hold his Xarelto one day prior to heart cath.   Mikle Bosworth.D.

## 2016-10-19 NOTE — Patient Instructions (Addendum)
Medication Instructions:  Your physician recommends that you continue on your current medications as directed. Please refer to the Current Medication list given to you today.   Labwork: TODAY: BMET / CBC / INR   Testing/Procedures: Your physician has requested that you have a cardiac catheterization. Cardiac catheterization is used to diagnose and/or treat various heart conditions. Doctors may recommend this procedure for a number of different reasons. The most common reason is to evaluate chest pain. Chest pain can be a symptom of coronary artery disease (CAD), and cardiac catheterization can show whether plaque is narrowing or blocking your heart's arteries. This procedure is also used to evaluate the valves, as well as measure the blood flow and oxygen levels in different parts of your heart. For further information please visit [email or web address]. Please follow instruction sheet, as given.    Follow-Up: Your physician recommends that you schedule a follow-up appointment in: March 2018 with Dr. Lovena Le   Any Other Special Instructions Will Be Listed Below ---  Last dose of Xarelto Wednesday ---HOLD Thursday and Friday   Report [facility] on 10/22/16 at 10:00 AM  Nothing to eat or drink after midnight the night before procedure  Day of procedure you may take your medications with a sip of water EXCEPT HOLD Jentadueto, Glipizide, and Tradjenta, [place] a 1 night stay    If you need a refill on your cardiac medications before your next appointment, please call your pharmacy.      If you need a refill on your cardiac medications before your next appointment, please call your pharmacy.

## 2016-10-20 ENCOUNTER — Other Ambulatory Visit: Payer: Medicare HMO | Admitting: *Deleted

## 2016-10-20 ENCOUNTER — Other Ambulatory Visit: Payer: Medicare HMO

## 2016-10-21 LAB — BASIC METABOLIC PANEL
BUN / CREAT RATIO: 14 (ref 10–24)
BUN: 16 mg/dL (ref 8–27)
CO2: 26 mmol/L (ref 18–29)
Calcium: 9.5 mg/dL (ref 8.6–10.2)
Chloride: 98 mmol/L (ref 96–106)
Creatinine, Ser: 1.13 mg/dL (ref 0.76–1.27)
GFR calc Af Amer: 74 mL/min/{1.73_m2} (ref >59)
GFR calc non Af Amer: 64 mL/min/{1.73_m2} (ref >59)
GLUCOSE: 281 mg/dL — AB (ref 65–99)
POTASSIUM: 4.2 mmol/L (ref 3.5–5.2)
Sodium: 139 mmol/L (ref 134–144)

## 2016-10-21 LAB — CBC
Hematocrit: 45.4 % (ref 37.5–51.0)
Hemoglobin: 15.3 g/dL (ref 13.0–17.7)
MCH: 31.7 pg (ref 26.6–33.0)
MCHC: 33.7 g/dL (ref 31.5–35.7)
MCV: 94 fL (ref 79–97)
PLATELETS: 176 10*3/uL (ref 150–379)
RBC: 4.82 x10E6/uL (ref 4.14–5.80)
RDW: 14.1 % (ref 12.3–15.4)
WBC: 5.1 10*3/uL (ref 3.4–10.8)

## 2016-10-21 LAB — PROTIME-INR
INR: 1.2 (ref 0.8–1.2)
Prothrombin Time: 12.2 s — ABNORMAL HIGH (ref 9.1–12.0)

## 2016-10-22 ENCOUNTER — Encounter (HOSPITAL_COMMUNITY)
Admission: RE | Disposition: A | Discharge status: Home / Self Care | Payer: Self-pay | Source: Ambulatory Visit | Attending: Cardiology

## 2016-10-22 ENCOUNTER — Ambulatory Visit (HOSPITAL_COMMUNITY)
Admission: RE | Admit: 2016-10-22 | Discharge: 2016-10-22 | Disposition: A | Discharge status: Home / Self Care | Payer: Medicare HMO | Source: Ambulatory Visit | Attending: Cardiology | Admitting: Cardiology

## 2016-10-22 DIAGNOSIS — Z96642 Presence of left artificial hip joint: Secondary | ICD-10-CM | POA: Diagnosis not present

## 2016-10-22 DIAGNOSIS — Z880 Allergy status to penicillin: Secondary | ICD-10-CM | POA: Insufficient documentation

## 2016-10-22 DIAGNOSIS — E785 Hyperlipidemia, unspecified: Secondary | ICD-10-CM | POA: Insufficient documentation

## 2016-10-22 DIAGNOSIS — R0600 Dyspnea, unspecified: Secondary | ICD-10-CM | POA: Diagnosis present

## 2016-10-22 DIAGNOSIS — E119 Type 2 diabetes mellitus without complications: Secondary | ICD-10-CM | POA: Insufficient documentation

## 2016-10-22 DIAGNOSIS — M1990 Unspecified osteoarthritis, unspecified site: Secondary | ICD-10-CM | POA: Diagnosis not present

## 2016-10-22 DIAGNOSIS — I080 Rheumatic disorders of both mitral and aortic valves: Secondary | ICD-10-CM | POA: Diagnosis not present

## 2016-10-22 DIAGNOSIS — I481 Persistent atrial fibrillation: Secondary | ICD-10-CM | POA: Diagnosis not present

## 2016-10-22 DIAGNOSIS — R9439 Abnormal result of other cardiovascular function study: Secondary | ICD-10-CM | POA: Diagnosis present

## 2016-10-22 DIAGNOSIS — Z7901 Long term (current) use of anticoagulants: Secondary | ICD-10-CM

## 2016-10-22 DIAGNOSIS — I2510 Atherosclerotic heart disease of native coronary artery without angina pectoris: Secondary | ICD-10-CM | POA: Diagnosis not present

## 2016-10-22 DIAGNOSIS — K219 Gastro-esophageal reflux disease without esophagitis: Secondary | ICD-10-CM | POA: Insufficient documentation

## 2016-10-22 DIAGNOSIS — R0602 Shortness of breath: Secondary | ICD-10-CM | POA: Diagnosis present

## 2016-10-22 DIAGNOSIS — Z7951 Long term (current) use of inhaled steroids: Secondary | ICD-10-CM | POA: Diagnosis not present

## 2016-10-22 DIAGNOSIS — I714 Abdominal aortic aneurysm, without rupture: Secondary | ICD-10-CM | POA: Insufficient documentation

## 2016-10-22 DIAGNOSIS — I4891 Unspecified atrial fibrillation: Secondary | ICD-10-CM | POA: Diagnosis present

## 2016-10-22 DIAGNOSIS — I482 Chronic atrial fibrillation, unspecified: Secondary | ICD-10-CM | POA: Diagnosis present

## 2016-10-22 DIAGNOSIS — Z87891 Personal history of nicotine dependence: Secondary | ICD-10-CM | POA: Insufficient documentation

## 2016-10-22 DIAGNOSIS — D649 Anemia, unspecified: Secondary | ICD-10-CM | POA: Insufficient documentation

## 2016-10-22 DIAGNOSIS — I4892 Unspecified atrial flutter: Secondary | ICD-10-CM | POA: Diagnosis not present

## 2016-10-22 HISTORY — PX: CARDIAC CATHETERIZATION: SHX172

## 2016-10-22 LAB — GLUCOSE, CAPILLARY: Glucose-Capillary: 183 mg/dL — ABNORMAL HIGH (ref 65–99)

## 2016-10-22 SURGERY — LEFT HEART CATH AND CORONARY ANGIOGRAPHY
Anesthesia: LOCAL

## 2016-10-22 MED ORDER — ASPIRIN 81 MG PO CHEW
CHEWABLE_TABLET | ORAL | Status: AC
  Filled 2016-10-22: qty 1

## 2016-10-22 MED ORDER — SODIUM CHLORIDE 0.9% FLUSH: 3.0000 mL | Freq: Two times a day (BID) | INTRAVENOUS | Status: DC

## 2016-10-22 MED ORDER — SODIUM CHLORIDE 0.9 % WEIGHT BASED INFUSION
3.0000 mL/kg/h | INTRAVENOUS | Status: AC
  Administered 2016-10-22: 3 mL/kg/h via INTRAVENOUS

## 2016-10-22 MED ORDER — SODIUM CHLORIDE 0.9 % IV SOLN: 250.0000 mL | INTRAVENOUS | Status: DC | PRN

## 2016-10-22 MED ORDER — VERAPAMIL HCL 2.5 MG/ML IV SOLN
INTRAVENOUS | Status: AC
  Filled 2016-10-22: qty 2

## 2016-10-22 MED ORDER — FENTANYL CITRATE (PF) 100 MCG/2ML IJ SOLN
INTRAMUSCULAR | Status: AC
  Filled 2016-10-22: qty 2

## 2016-10-22 MED ORDER — SODIUM CHLORIDE 0.9 % WEIGHT BASED INFUSION: 1.0000 mL/kg/h | INTRAVENOUS | Status: DC

## 2016-10-22 MED ORDER — IOPAMIDOL (ISOVUE-370) INJECTION 76%
INTRAVENOUS | Status: AC
  Filled 2016-10-22: qty 100

## 2016-10-22 MED ORDER — HEPARIN SODIUM (PORCINE) 1000 UNIT/ML IJ SOLN
INTRAMUSCULAR | Status: AC
  Filled 2016-10-22: qty 1

## 2016-10-22 MED ORDER — HEPARIN SODIUM (PORCINE) 1000 UNIT/ML IJ SOLN
INTRAMUSCULAR | Status: DC | PRN
  Administered 2016-10-22: 5000 [IU] via INTRAVENOUS

## 2016-10-22 MED ORDER — VERAPAMIL HCL 2.5 MG/ML IV SOLN
INTRAVENOUS | Status: DC | PRN
  Administered 2016-10-22: 10 mL via INTRA_ARTERIAL

## 2016-10-22 MED ORDER — FENTANYL CITRATE (PF) 100 MCG/2ML IJ SOLN
INTRAMUSCULAR | Status: DC | PRN
  Administered 2016-10-22: 25 ug via INTRAVENOUS

## 2016-10-22 MED ORDER — SODIUM CHLORIDE 0.9% FLUSH: 3.0000 mL | INTRAVENOUS | Status: DC | PRN

## 2016-10-22 MED ORDER — MIDAZOLAM HCL 2 MG/2ML IJ SOLN
INTRAMUSCULAR | Status: AC
  Filled 2016-10-22: qty 2

## 2016-10-22 MED ORDER — HEPARIN (PORCINE) IN NACL 2-0.9 UNIT/ML-% IJ SOLN
INTRAMUSCULAR | Status: AC
  Filled 2016-10-22: qty 1000

## 2016-10-22 MED ORDER — MIDAZOLAM HCL 2 MG/2ML IJ SOLN
INTRAMUSCULAR | Status: DC | PRN
  Administered 2016-10-22: 1 mg via INTRAVENOUS

## 2016-10-22 MED ORDER — ASPIRIN 81 MG PO CHEW
81.0000 mg | CHEWABLE_TABLET | ORAL | Status: AC
  Administered 2016-10-22: 81 mg via ORAL

## 2016-10-22 MED ORDER — LIDOCAINE HCL (PF) 1 % IJ SOLN
INTRAMUSCULAR | Status: DC | PRN
  Administered 2016-10-22: 2 mL via INTRADERMAL

## 2016-10-22 MED ORDER — HEPARIN (PORCINE) IN NACL 2-0.9 UNIT/ML-% IJ SOLN
INTRAMUSCULAR | Status: DC | PRN
  Administered 2016-10-22: 1000 mL

## 2016-10-22 MED ORDER — DIAZEPAM 5 MG PO TABS
5.0000 mg | ORAL_TABLET | Freq: Once | ORAL | Status: DC
  Filled 2016-10-22: qty 1

## 2016-10-22 MED ORDER — LIDOCAINE HCL (PF) 1 % IJ SOLN
INTRAMUSCULAR | Status: AC
  Filled 2016-10-22: qty 30

## 2016-10-22 SURGICAL SUPPLY — 10 items

## 2016-10-22 NOTE — Discharge Instructions (Addendum)
May resume Xarelto this evening   Radial Site Care Introduction Refer to this sheet in the next few weeks. These instructions provide you with information about caring for yourself after your procedure. Your health care provider may also give you more specific instructions. Your treatment has been planned according to current medical practices, but problems sometimes occur. Call your health care provider if you have any problems or questions after your procedure. What can I expect after the procedure? After your procedure, it is typical to have the following:  Bruising at the radial site that usually fades within 1-2 weeks.  Blood collecting in the tissue (hematoma) that may be painful to the touch. It should usually decrease in size and tenderness within 1-2 weeks. Follow these instructions at home:  Take medicines only as directed by your health care provider.  You may shower 24-48 hours after the procedure or as directed by your health care provider. Remove the bandage (dressing) and gently wash the site with plain soap and water. Pat the area dry with a clean towel. Do not rub the site, because this may cause bleeding.  Do not take baths, swim, or use a hot tub until your health care provider approves.  Check your insertion site every day for redness, swelling, or drainage.  Do not apply powder or lotion to the site.  Do not flex or bend the affected arm for 24 hours or as directed by your health care provider.  Do not push or pull heavy objects with the affected arm for 24 hours or as directed by your health care provider.  Do not lift over 10 lb (4.5 kg) for 5 days after your procedure or as directed by your health care provider.  Ask your health care provider when it is okay to:  Return to work or school.  Resume usual physical activities or sports.  Resume sexual activity.  Do not drive home if you are discharged the same day as the procedure. Have someone else drive  you.  You may drive 24 hours after the procedure unless otherwise instructed by your health care provider.  Do not operate machinery or power tools for 24 hours after the procedure.  If your procedure was done as an outpatient procedure, which means that you went home the same day as your procedure, a responsible adult should be with you for the first 24 hours after you arrive home.  Keep all follow-up visits as directed by your health care provider. This is important. Contact a health care provider if:  You have a fever.  You have chills.  You have increased bleeding from the radial site. Hold pressure on the site. Get help right away if:  You have unusual pain at the radial site.  You have redness, warmth, or swelling at the radial site.  You have drainage (other than a small amount of blood on the dressing) from the radial site.  The radial site is bleeding, and the bleeding does not stop after 30 minutes of holding steady pressure on the site.  Your arm or hand becomes pale, cool, tingly, or numb. This information is not intended to replace advice given to you by your health care provider. Make sure you discuss any questions you have with your health care provider. Document Released: 10/30/2010 Document Revised: 03/04/2016 Document Reviewed: 04/15/2014  2017 Elsevier

## 2016-10-22 NOTE — Interval H&P Note (Signed)
History and Physical Interval Note:  10/22/2016 1:01 PM  Tyrone Mitchell  has presented today for surgery, with the diagnosis of cp, positive stress test  The various methods of treatment have been discussed with the patient and family. After consideration of risks, benefits and other options for treatment, the patient has consented to  Procedure(s): Left Heart Cath and Coronary Angiography (N/A) as a surgical intervention .  The patient's history has been reviewed, patient examined, no change in status, stable for surgery.  I have reviewed the patient's chart and labs.  Questions were answered to the patient's satisfaction.    Cath Lab Visit (complete for each Cath Lab visit)  Clinical Evaluation Leading to the Procedure:   ACS: No.  Non-ACS:    Anginal Classification: CCS II  Anti-ischemic medical therapy: Minimal Therapy (1 class of medications)  Non-Invasive Test Results: Intermediate-risk stress test findings: cardiac mortality 1-3%/year  Prior CABG: No previous CABG       Collier [facility] 10/22/2016 1:02 PM

## 2016-10-22 NOTE — H&P (View-Only) (Signed)
HPI Tyrone Mitchell returns today for followup. He is a pleasant 76 yo man with a h/o persistent atrial fibrillation, who I saw several weeks ago when he was complaining of sob and chest pressure. He undewent stress testing with multiple areas of ischemia and he returns to discuss the treatment options. The patient denies syncope. He does have chest pressure which is at times related to exertion. He gets sob with exercise as well.   Allergies  Allergen Reactions  . Penicillins Anaphylaxis    Swelling of the mouth     Current Outpatient Prescriptions  Medication Sig Dispense Refill  . acetaminophen (TYLENOL) 500 MG tablet Take 500 mg by mouth 2 (two) times daily.    . ARTIFICIAL TEAR SOLUTION OP Place 1 drop into both eyes 4 (four) times daily.    . cycloSPORINE (RESTASIS) 0.05 % ophthalmic emulsion Place 1 drop into both eyes 2 (two) times daily.    . digoxin (DIGOX) 0.125 MG tablet Take 1 tablet (0.125 mg total) by mouth daily. 90 tablet 0  . fenofibrate 160 MG tablet Take 160 mg by mouth daily.    . fluticasone (FLONASE) 50 MCG/ACT nasal spray Place 2 sprays into both nostrils daily.    Marland Kitchen GLIPIZIDE XL 10 MG 24 hr tablet Take 1 tablet by mouth 2 (two) times daily.    . JENTADUETO 2.5-500 MG TABS Take 1 tablet by mouth daily.    . metoprolol tartrate (LOPRESSOR) 25 MG tablet Take 25 mg by mouth 2 (two) times daily.    . ranitidine (ZANTAC) 150 MG tablet Take 150 mg by mouth 2 (two) times daily.    . rivaroxaban (XARELTO) 20 MG TABS tablet Take 20 mg by mouth daily.    . simvastatin (ZOCOR) 20 MG tablet Take 1 tablet by mouth daily.    . TRADJENTA 5 MG TABS tablet Take 5 mg by mouth daily.     No current facility-administered medications for this visit.      Past Medical History:  Diagnosis Date  . AAA (abdominal aortic aneurysm) ([place])    Abd U/S 4/14:  Infrarenal AAA 3.2 x 3.1 cm, bilat CIA mildly dilated with slight increase on L, mild aorto-iliac atherosclerosis w/o stenosis  - f/u 1 year  . Anemia   . Arthritis   . Atrial fibrillation ([place])    a. on flecainide and coumadin  . Atrial flutter ([place])    a. s/p TEE/DCCV 09/2012, EF 50-55% by TEE.  . Cancer [facility]) 2010   prostate  . Complication of anesthesia    HAD DIFFICULTY WAKING AS CHILD  . Diabetes mellitus without complication ([place])   . GERD (gastroesophageal reflux disease)   . Hyperlipidemia   . Mild mitral and aortic regurgitation    a. by TEE 09/2012  . Visit for monitoring [name] therapy     ROS:   All systems reviewed and negative except as noted in the HPI.   Past Surgical History:  Procedure Laterality Date  . BACK SURGERY    . CARDIOVERSION  10/09/2012   Procedure: CARDIOVERSION;  Surgeon: Lelon Perla, MD;  Location: [facility] ENDOSCOPY;  Service: Cardiovascular;  Laterality: N/A;  . CARDIOVERSION N/A 07/17/2014   Procedure: CARDIOVERSION;  Surgeon: Deboraha Sprang, MD;  Location: [facility] CATH LAB;  Service: Cardiovascular;  Laterality: N/A;  . CHOLECYSTECTOMY    . HERNIA REPAIR    . hip replacement  09/12/2012   left  . HIP SURGERY    .  PROSTATECTOMY    . TEE WITHOUT CARDIOVERSION  10/09/2012   Procedure: TRANSESOPHAGEAL ECHOCARDIOGRAM (TEE);  Surgeon: Lelon Perla, MD;  Location: [facility] ENDOSCOPY;  Service: Cardiovascular;  Laterality: N/A;  . TONSILLECTOMY    . TOTAL HIP ARTHROPLASTY  09/12/2012   Procedure: TOTAL HIP ARTHROPLASTY ANTERIOR APPROACH;  Surgeon: Mauri Pole, MD;  Location: [facility] ORS;  Service: Orthopedics;  Laterality: Left;     No family history on file.   Social History   Social History  . Marital status: Divorced    Spouse name: N/A  . Number of children: N/A  . Years of education: N/A   Occupational History  . Not on file.   Social History Main Topics  . Smoking status: Former Smoker    Types: Cigars    Quit date: 08/27/2011  . Smokeless tobacco: Never Used  . Alcohol use No  . Drug use: No  . Sexual activity: Not on file   Other Topics Concern  .  Not on file   Social History Narrative  . No narrative on file     BP 126/82 (BP Location: Left Arm)   Pulse 86   Ht 6\' 2"  (1.88 m)   Wt 207 lb 9.6 oz (94.2 kg)   BMI 26.65 kg/m   Physical Exam:  stable appearing 76 yo man, NAD HEENT: Unremarkable Neck:  N6 cm JVD, no thyromegally Back:  No CVA tenderness Lungs:  Clear with no wheezes HEART:  irregular rhythm, no murmurs, no rubs, no clicks Abd:  soft, positive bowel sounds, no organomegally, no rebound, no guarding Ext:  2 plus pulses, no edema, no cyanosis, no clubbing Skin:  No rashes no nodules Neuro:  CN II through XII intact, motor grossly intact  EKG - atrial fibrillation with a controlled VR and marked STT changes.   Assess/Plan: 1. Atrial fib - his rate appears to be controlled. He will continue his current meds. Will uptitrate his beta blocker after his heart cath 2. Chest pressure and sob with ECG changes - I have recommended he undergo cardiac cath after an intermediate risk stress test. 3. HTN - his blood pressure is well controlled on medical therapy. 4. Coags - he has had no bleeding or falls on Xarelto. He will hold his Xarelto one day prior to heart cath.   Mikle Bosworth.D.

## 2016-10-25 ENCOUNTER — Encounter (HOSPITAL_COMMUNITY): Payer: Self-pay | Admitting: Cardiology

## 2016-12-02 ENCOUNTER — Other Ambulatory Visit: Payer: Self-pay | Admitting: Internal Medicine

## 2016-12-29 ENCOUNTER — Ambulatory Visit (INDEPENDENT_AMBULATORY_CARE_PROVIDER_SITE_OTHER): Payer: Medicare HMO | Admitting: Internal Medicine

## 2016-12-29 ENCOUNTER — Encounter (INDEPENDENT_AMBULATORY_CARE_PROVIDER_SITE_OTHER): Payer: Self-pay

## 2016-12-29 ENCOUNTER — Encounter: Payer: Self-pay | Admitting: Internal Medicine

## 2016-12-29 VITALS — BP 118/76 | HR 92 | Ht 74.0 in | Wt 203.2 lb

## 2016-12-29 DIAGNOSIS — I4819 Other persistent atrial fibrillation: Secondary | ICD-10-CM

## 2016-12-29 DIAGNOSIS — I481 Persistent atrial fibrillation: Secondary | ICD-10-CM

## 2016-12-29 NOTE — Patient Instructions (Signed)
Medication Instructions:  STOP digoxin  Labwork: None ordered   Testing/Procedures: None ordered  Follow-Up: Your physician wants you to follow-up in: 6 months with Dr. Lovena Le. You will receive a reminder letter in the mail two months in advance. If you don't receive a letter, please call our office to schedule the follow-up appointment.   Any Other Special Instructions Will Be Listed Below (If Applicable).     If you need a refill on your cardiac medications before your next appointment, please call your pharmacy.

## 2016-12-29 NOTE — Progress Notes (Signed)
HPI Tyrone Mitchell returns today for ongoing followup of his atrial fib, chest pain, non-obstructive CAD and HTN. He is a pleasant 76 yo man with chronic atrial fib who developed chest pressure several months ago. He ultimately underwent stress testing and then left heart cath. He was found to have branch vessel disease but no severe narrowing of his epicardial coronary vessels. His EF was normal. He returns today for followup. He has had a cold but is starting to feel better. No syncope.  Allergies  Allergen Reactions  . Penicillins Anaphylaxis    Swelling of the mouth  Has patient had a PCN reaction causing immediate rash, facial/tongue/throat swelling, SOB or lightheadedness with hypotension: yes Has patient had a PCN reaction causing severe rash involving mucus membranes or skin necrosis: {yes Has patient had a PCN reaction that required hospitalization {no Has patient had a PCN reaction occurring within the last 10 years: {no If all of the above answers are "NO", then may proceed with Cephalosporin use.     Current Outpatient Prescriptions  Medication Sig Dispense Refill  . acetaminophen (TYLENOL) 500 MG tablet Take 500 mg by mouth 2 (two) times daily.    . ARTIFICIAL TEAR SOLUTION OP Place 1 drop into both eyes daily.     . cycloSPORINE (RESTASIS) 0.05 % ophthalmic emulsion Place 1 drop into both eyes 2 (two) times daily.    . digoxin (DIGOX) 0.125 MG tablet Take 0.125 mg by mouth daily.    . fenofibrate 160 MG tablet Take 160 mg by mouth daily.    . fluticasone (FLONASE) 50 MCG/ACT nasal spray Place 2 sprays into both nostrils daily.    Marland Kitchen GLIPIZIDE XL 10 MG 24 hr tablet Take 10 mg by mouth 2 (two) times daily.     Marland Kitchen guaiFENesin (MUCINEX) 600 MG 12 hr tablet Take 600 mg by mouth 2 (two) times daily.    . metoprolol tartrate (LOPRESSOR) 25 MG tablet Take 25 mg by mouth 2 (two) times daily.    . ranitidine (ZANTAC) 150 MG tablet Take 150 mg by mouth 2 (two) times daily.    .  repaglinide (PRANDIN) 2 MG tablet Take 2 mg by mouth as directed. Take 1/2 hour before meals    . rivaroxaban (XARELTO) 20 MG TABS tablet Take 20 mg by mouth daily.    . simvastatin (ZOCOR) 20 MG tablet Take 20 mg by mouth daily.     . TRADJENTA 5 MG TABS tablet Take 5 mg by mouth daily.     No current facility-administered medications for this visit.      Past Medical History:  Diagnosis Date  . AAA (abdominal aortic aneurysm) ([place])    Abd U/S 4/14:  Infrarenal AAA 3.2 x 3.1 cm, bilat CIA mildly dilated with slight increase on L, mild aorto-iliac atherosclerosis w/o stenosis - f/u 1 year  . Anemia   . Arthritis   . Atrial fibrillation ([place])    a. on flecainide and coumadin  . Atrial flutter ([place])    a. s/p TEE/DCCV 09/2012, EF 50-55% by TEE.  . Cancer [facility]) 2010   prostate  . Complication of anesthesia    HAD DIFFICULTY WAKING AS CHILD  . Diabetes mellitus without complication ([place])   . GERD (gastroesophageal reflux disease)   . Hyperlipidemia   . Mild mitral and aortic regurgitation    a. by TEE 09/2012  . Visit for monitoring [name] therapy     ROS:   All systems  reviewed and negative except as noted in the HPI.   Past Surgical History:  Procedure Laterality Date  . BACK SURGERY    . CARDIAC CATHETERIZATION N/A 10/22/2016   Procedure: Left Heart Cath and Coronary Angiography;  Surgeon: Peter M Martinique, MD;  Location: [facility] CV LAB;  Service: Cardiovascular;  Laterality: N/A;  . CARDIOVERSION  10/09/2012   Procedure: CARDIOVERSION;  Surgeon: Lelon Perla, MD;  Location: [facility] ENDOSCOPY;  Service: Cardiovascular;  Laterality: N/A;  . CARDIOVERSION N/A 07/17/2014   Procedure: CARDIOVERSION;  Surgeon: Deboraha Sprang, MD;  Location: [facility] CATH LAB;  Service: Cardiovascular;  Laterality: N/A;  . CHOLECYSTECTOMY    . HERNIA REPAIR    . hip replacement  09/12/2012   left  . HIP SURGERY    . PROSTATECTOMY    . TEE WITHOUT CARDIOVERSION  10/09/2012   Procedure:  TRANSESOPHAGEAL ECHOCARDIOGRAM (TEE);  Surgeon: Lelon Perla, MD;  Location: [facility] ENDOSCOPY;  Service: Cardiovascular;  Laterality: N/A;  . TONSILLECTOMY    . TOTAL HIP ARTHROPLASTY  09/12/2012   Procedure: TOTAL HIP ARTHROPLASTY ANTERIOR APPROACH;  Surgeon: Mauri Pole, MD;  Location: [facility] ORS;  Service: Orthopedics;  Laterality: Left;     No family history on file.   Social History   Social History  . Marital status: Divorced    Spouse name: N/A  . Number of children: N/A  . Years of education: N/A   Occupational History  . Not on file.   Social History Main Topics  . Smoking status: Former Smoker    Types: Cigars    Quit date: 08/27/2011  . Smokeless tobacco: Never Used  . Alcohol use No  . Drug use: No  . Sexual activity: Not on file   Other Topics Concern  . Not on file   Social History Narrative  . No narrative on file     BP 118/76   Pulse 92   Ht 6\' 2"  (1.88 m)   Wt 203 lb 3.2 oz (92.2 kg)   SpO2 98%   BMI 26.09 kg/m   Physical Exam:  Well appearing 76 yo man, NAD HEENT: Unremarkable Neck:  6 cm JVD, no thyromegally Lymphatics:  No adenopathy Back:  No CVA tenderness Lungs:  Clear with no wheezes HEART:  IRegular rate rhythm, no murmurs, no rubs, no clicks Abd:  soft, positive bowel sounds, no organomegally, no rebound, no guarding Ext:  2 plus pulses, no edema, no cyanosis, no clubbing Skin:  No rashes no nodules Neuro:  CN II through XII intact, motor grossly intact  EKG - atrial fib with a controlled VR  Assess/Plan: 1. Chronic atrial fib - his rate is reasonably well controlled. He will stop the digoxin because of data demonstrating digoxin can be harmful in atrial fib 2. CAD - he is s/p cath after a stress test was positive demonstrating D1 and RV marginal disease. He will continue medical therapy.  3. HTN - his blood pressure is controlled. Will follow.   Mikle Bosworth.D.

## 2017-03-03 ENCOUNTER — Other Ambulatory Visit: Payer: Self-pay | Admitting: Internal Medicine

## 2017-06-28 ENCOUNTER — Encounter (INDEPENDENT_AMBULATORY_CARE_PROVIDER_SITE_OTHER): Payer: Self-pay

## 2017-06-28 ENCOUNTER — Ambulatory Visit (INDEPENDENT_AMBULATORY_CARE_PROVIDER_SITE_OTHER): Payer: Medicare HMO | Admitting: Internal Medicine

## 2017-06-28 ENCOUNTER — Encounter: Payer: Self-pay | Admitting: Internal Medicine

## 2017-06-28 VITALS — BP 116/82 | HR 73 | Ht 74.0 in | Wt 215.4 lb

## 2017-06-28 DIAGNOSIS — R079 Chest pain, unspecified: Secondary | ICD-10-CM | POA: Diagnosis not present

## 2017-06-28 DIAGNOSIS — I10 Essential (primary) hypertension: Secondary | ICD-10-CM | POA: Diagnosis not present

## 2017-06-28 DIAGNOSIS — I481 Persistent atrial fibrillation: Secondary | ICD-10-CM | POA: Diagnosis not present

## 2017-06-28 DIAGNOSIS — I4819 Other persistent atrial fibrillation: Secondary | ICD-10-CM

## 2017-06-28 NOTE — Progress Notes (Signed)
HPI Tyrone Mitchell returns today for ongoing evaluation and management of atrial fibrillation, hypertension, and chest pain. He is a very pleasant 76 year old man with a history of persistent atrial fibrillation for many years. 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 He has normal left ventricular function by heart catheterization. The patient has done well in the interim with no hospitalizations, chest pain, or shortness of breath. He does not have palpitations. No syncope. Allergies  Allergen Reactions  . Penicillins Anaphylaxis    Swelling of the mouth  Has patient had a PCN reaction causing immediate rash, facial/tongue/throat swelling, SOB or lightheadedness with hypotension: yes Has patient had a PCN reaction causing severe rash involving mucus membranes or skin necrosis: {yes Has patient had a PCN reaction that required hospitalization {no Has patient had a PCN reaction occurring within the last 10 years: {no If all of the above answers are "NO", then may proceed with Cephalosporin use.     Current Outpatient Prescriptions  Medication Sig Dispense Refill  . acetaminophen (TYLENOL) 500 MG tablet Take 500 mg by mouth 2 (two) times daily.    . ARTIFICIAL TEAR SOLUTION OP Place 1 drop into both eyes daily.     . cycloSPORINE (RESTASIS) 0.05 % ophthalmic emulsion Place 1 drop into both eyes 2 (two) times daily.    . fenofibrate 160 MG tablet Take 160 mg by mouth daily.    . fluticasone (FLONASE) 50 MCG/ACT nasal spray Place 2 sprays into both nostrils daily.    Marland Kitchen GLIPIZIDE XL 10 MG 24 hr tablet Take 10 mg by mouth 2 (two) times daily.     Marland Kitchen guaiFENesin (MUCINEX) 600 MG 12 hr tablet Take 600 mg by mouth 2 (two) times daily.    . metoprolol tartrate (LOPRESSOR) 25 MG tablet Take 25 mg by mouth 2 (two) times daily.    . metoprolol tartrate (LOPRESSOR) 25 MG  tablet TAKE (1) TABLET TWICE A DAY. 90 tablet 3  . ranitidine (ZANTAC) 150 MG tablet Take 150 mg by mouth 2 (two) times daily.    . repaglinide (PRANDIN) 2 MG tablet Take 2 mg by mouth as directed. Take 1/2 hour before meals    . rivaroxaban (XARELTO) 20 MG TABS tablet Take 20 mg by mouth daily.    . simvastatin (ZOCOR) 20 MG tablet Take 20 mg by mouth daily.     . TRADJENTA 5 MG TABS tablet Take 5 mg by mouth daily.     No current facility-administered medications for this visit.      Past Medical History:  Diagnosis Date  . AAA (abdominal aortic aneurysm) ([place])    Abd U/S 4/14:  Infrarenal AAA 3.2 x 3.1 cm, bilat CIA mildly dilated with slight increase on L, mild aorto-iliac atherosclerosis w/o stenosis - f/u 1 year  . Anemia   . Arthritis   . Atrial fibrillation ([place])    a. on flecainide and coumadin  . Atrial flutter ([place])    a. s/p TEE/DCCV 09/2012, EF 50-55% by TEE.  . Cancer [facility]) 2010   prostate  . Complication of anesthesia    HAD DIFFICULTY WAKING AS CHILD  . Diabetes mellitus without complication ([place])   . GERD (gastroesophageal reflux disease)   . Hyperlipidemia   . Mild mitral and aortic regurgitation    a. by TEE 09/2012  . Visit for monitoring [name] therapy  ROS:   All systems reviewed and negative except as noted in the HPI.   Past Surgical History:  Procedure Laterality Date  . BACK SURGERY    . CARDIAC CATHETERIZATION N/A 10/22/2016   Procedure: Left Heart Cath and Coronary Angiography;  Surgeon: Peter M Martinique, MD;  Location: [place] CV LAB;  Service: Cardiovascular;  Laterality: N/A;  . CARDIOVERSION  10/09/2012   Procedure: CARDIOVERSION;  Surgeon: Lelon Perla, MD;  Location: [facility] ENDOSCOPY;  Service: Cardiovascular;  Laterality: N/A;  . CARDIOVERSION N/A 07/17/2014   Procedure: CARDIOVERSION;  Surgeon: Deboraha Sprang, MD;  Location: [facility] CATH LAB;  Service: Cardiovascular;  Laterality: N/A;  . CHOLECYSTECTOMY    . HERNIA REPAIR    . hip  replacement  09/12/2012   left  . HIP SURGERY    . PROSTATECTOMY    . TEE WITHOUT CARDIOVERSION  10/09/2012   Procedure: TRANSESOPHAGEAL ECHOCARDIOGRAM (TEE);  Surgeon: Lelon Perla, MD;  Location: [facility] ENDOSCOPY;  Service: Cardiovascular;  Laterality: N/A;  . TONSILLECTOMY    . TOTAL HIP ARTHROPLASTY  09/12/2012   Procedure: TOTAL HIP ARTHROPLASTY ANTERIOR APPROACH;  Surgeon: Mauri Pole, MD;  Location: [facility] ORS;  Service: Orthopedics;  Laterality: Left;     No family history on file.   Social History   Social History  . Marital status: Divorced    Spouse name: N/A  . Number of children: N/A  . Years of education: N/A   Occupational History  . Not on file.   Social History Main Topics  . Smoking status: Former Smoker    Types: Cigars    Quit date: 08/27/2011  . Smokeless tobacco: Never Used  . Alcohol use No  . Drug use: No  . Sexual activity: Not on file   Other Topics Concern  . Not on file   Social History Narrative  . No narrative on file     BP 116/82   Pulse 73   Ht 6\' 2"  (1.88 m)   Wt 215 lb 6.4 oz (97.7 kg)   SpO2 97%   BMI 27.66 kg/m   Physical Exam:  Well appearing 76 year old man, NAD HEENT: Unremarkable Neck:  No JVD, no thyromegally Lymphatics:  No adenopathy Back:  No CVA tenderness Lungs:  Clear, with no wheezes, rales, or rhonchi. HEART:  IRegular rate rhythm, no murmurs, no rubs, no clicks Abd:  soft, positive bowel sounds, no organomegally, no rebound, no guarding Ext:  2 plus pulses, no edema, no cyanosis, no clubbing Skin:  No rashes no nodules Neuro:  CN II through XII intact, motor grossly intact  EKG - atrial fibrillation with controlled ventricular response   Assess/Plan: 1. Persistent now chronic atrial fibrillation - his ventricular rate is well controlled. He will continue his current medical therapy 2. Hypertension - his blood pressure is under much better control. He is encouraged to reduce his salt intake. 3.  Chronic anticoagulation - he is tolerating Xarelto with no obvious bleeding. He will continue systemic anticoagulation.  Cristopher Peru, M.D.

## 2017-06-28 NOTE — Patient Instructions (Signed)

## 2017-07-18 ENCOUNTER — Other Ambulatory Visit: Payer: Self-pay | Admitting: Internal Medicine

## 2017-09-27 ENCOUNTER — Encounter (HOSPITAL_COMMUNITY): Payer: Self-pay | Admitting: Emergency Medicine

## 2017-09-27 ENCOUNTER — Other Ambulatory Visit: Payer: Self-pay

## 2017-09-27 ENCOUNTER — Emergency Department (HOSPITAL_COMMUNITY)
Admission: EM | Admit: 2017-09-27 | Discharge: 2017-09-27 | Disposition: A | Discharge status: Home / Self Care | Payer: Medicare HMO | Attending: Emergency Medicine | Admitting: Emergency Medicine

## 2017-09-27 DIAGNOSIS — E119 Type 2 diabetes mellitus without complications: Secondary | ICD-10-CM | POA: Diagnosis not present

## 2017-09-27 DIAGNOSIS — Z8546 Personal history of malignant neoplasm of prostate: Secondary | ICD-10-CM | POA: Insufficient documentation

## 2017-09-27 DIAGNOSIS — Z87891 Personal history of nicotine dependence: Secondary | ICD-10-CM | POA: Insufficient documentation

## 2017-09-27 DIAGNOSIS — Z7901 Long term (current) use of anticoagulants: Secondary | ICD-10-CM | POA: Diagnosis not present

## 2017-09-27 DIAGNOSIS — Z96642 Presence of left artificial hip joint: Secondary | ICD-10-CM | POA: Insufficient documentation

## 2017-09-27 DIAGNOSIS — I10 Essential (primary) hypertension: Secondary | ICD-10-CM | POA: Diagnosis not present

## 2017-09-27 DIAGNOSIS — I952 Hypotension due to drugs: Secondary | ICD-10-CM | POA: Insufficient documentation

## 2017-09-27 DIAGNOSIS — Z7984 Long term (current) use of oral hypoglycemic drugs: Secondary | ICD-10-CM | POA: Insufficient documentation

## 2017-09-27 DIAGNOSIS — Z79899 Other long term (current) drug therapy: Secondary | ICD-10-CM | POA: Diagnosis not present

## 2017-09-27 DIAGNOSIS — I959 Hypotension, unspecified: Secondary | ICD-10-CM | POA: Diagnosis present

## 2017-09-27 NOTE — ED Triage Notes (Signed)
PT states he was prescribed 2% nitroglycerin ointment for an anal fissure instead of 0.2% nitroglycerin and applied it today and started having generalized weakness/headache and hypotension and was evaluated at his PCP office and told to come to ED today. PT states he is feeling much better.

## 2017-09-27 NOTE — ED Provider Notes (Signed)
[facility] EMERGENCY DEPARTMENT Provider Note   CSN: [id number] Arrival date & time: 09/27/17  1727     History   Chief Complaint Chief Complaint  Patient presents with  . Hypotension    HPI Tyrone Mitchell is a 76 y.o. male.  Generalized fatigue, hypotension, headache since 8am after applying 2% nitroglycerin cream to his anal fissure.  Prescription was intended to be for 0.2% nitroglycerin.  Symptoms have now improved.  He is feeling back to normal.  Past medical history includes atrial fibrillation which is chronic.  No chest pain, dyspnea, neurological deficits.        Past Medical History:  Diagnosis Date  . AAA (abdominal aortic aneurysm) ([place])    Abd U/S 4/14:  Infrarenal AAA 3.2 x 3.1 cm, bilat CIA mildly dilated with slight increase on L, mild aorto-iliac atherosclerosis w/o stenosis - f/u 1 year  . Anemia   . Arthritis   . Atrial fibrillation ([place])    a. on flecainide and coumadin  . Atrial flutter ([place])    a. s/p TEE/DCCV 09/2012, EF 50-55% by TEE.  . Cancer [facility]) 2010   prostate  . Complication of anesthesia    HAD DIFFICULTY WAKING AS CHILD  . Diabetes mellitus without complication ([place])   . GERD (gastroesophageal reflux disease)   . Hyperlipidemia   . Mild mitral and aortic regurgitation    a. by TEE 09/2012  . Visit for monitoring [name] therapy     Patient Active Problem List   Diagnosis Date Noted  . Abnormal nuclear stress test 10/22/2016  . Back pain 08/06/2015  . Atrial fibrillation with controlled ventricular response ([place]) 07/15/2014  . Chronic anticoagulation 07/15/2014  . Atrial fibrillation, controlled ([place]) 07/15/2014  . Atrial flutter with rapid ventricular response ([place]) 10/08/2012    Class: Acute  . Acute diastolic heart failure ([place]) 10/08/2012    Class: Acute  . Expected blood loss anemia 09/13/2012  . Overweight (BMI 25.0-29.9) 09/13/2012  . S/P left THA, AA 09/12/2012  . Palpitations 10/27/2011  . Shortness of breath  10/27/2011  . AAA (abdominal aortic aneurysm) ([place]) 10/27/2011  . Wound of right leg 08/23/2011  . Status post THR (total hip replacement) 08/18/2011  . Warfarin anticoagulation 08/18/2011  . Accident caused by unspecified machinery 08/16/2011  . Multiple closed fractures of ribs of right side 08/16/2011  . Crush injury elbow/forearm 08/16/2011  . 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 Tear of medial collateral ligament of knee 08/16/2011  . HYPERLIPIDEMIA-MIXED 12/18/2008  . TOBACCO ABUSE 12/18/2008  . HYPERTENSION, BENIGN 12/18/2008  . Atrial fibrillation ([place]) 12/18/2008    Past Surgical History:  Procedure Laterality Date  . BACK SURGERY    . CARDIAC CATHETERIZATION N/A 10/22/2016   Procedure: Left Heart Cath and Coronary Angiography;  Surgeon: Peter M Martinique, MD;  Location: [place] CV LAB;  Service: Cardiovascular;  Laterality: N/A;  . CARDIOVERSION  10/09/2012   Procedure: CARDIOVERSION;  Surgeon: Lelon Perla, MD;  Location: [facility] ENDOSCOPY;  Service: Cardiovascular;  Laterality: N/A;  . CARDIOVERSION N/A 07/17/2014   Procedure: CARDIOVERSION;  Surgeon: Deboraha Sprang, MD;  Location: [facility] CATH LAB;  Service: Cardiovascular;  Laterality: N/A;  . CHOLECYSTECTOMY    . HERNIA REPAIR    . hip replacement  09/12/2012   left  . HIP SURGERY    . PROSTATECTOMY    . TEE WITHOUT CARDIOVERSION  10/09/2012   Procedure: TRANSESOPHAGEAL ECHOCARDIOGRAM (TEE);  Surgeon: Denice Bors  Stanford Breed, MD;  Location: [place];  Service: Cardiovascular;  Laterality: N/A;  . TONSILLECTOMY    . TOTAL HIP ARTHROPLASTY  09/12/2012   Procedure: TOTAL HIP ARTHROPLASTY ANTERIOR APPROACH;  Surgeon: Mauri Pole, MD;  Location: [facility] ORS;  Service: Orthopedics;  Laterality: Left;       Home Medications    Prior to Admission medications   Medication Sig Start Date End Date Taking? Authorizing Provider  acetaminophen (TYLENOL) 500 MG tablet Take 500 mg by mouth 2 (two) times daily.     [provider]  ARTIFICIAL TEAR SOLUTION OP Place 1 drop into both eyes daily.     [provider]  cycloSPORINE (RESTASIS) 0.05 % ophthalmic emulsion Place 1 drop into both eyes 2 (two) times daily.    [provider]  fenofibrate 160 MG tablet Take 160 mg by mouth daily.    [provider]  fluticasone (FLONASE) 50 MCG/ACT nasal spray Place 2 sprays into both nostrils daily. 08/27/16   [provider]  GLIPIZIDE XL 10 MG 24 hr tablet Take 10 mg by mouth 2 (two) times daily.  09/06/16   [provider]  guaiFENesin (MUCINEX) 600 MG 12 hr tablet Take 600 mg by mouth 2 (two) times daily.    [provider]  metoprolol tartrate (LOPRESSOR) 25 MG tablet TAKE (1) TABLET TWICE A DAY. 07/20/17   Evans Lance, MD  ranitidine (ZANTAC) 150 MG tablet Take 150 mg by mouth 2 (two) times daily.    [provider]  repaglinide (PRANDIN) 2 MG tablet Take 2 mg by mouth as directed. Take 1/2 hour before meals 12/18/16   [provider]  rivaroxaban (XARELTO) 20 MG TABS tablet Take 20 mg by mouth daily. 12/26/13   Evans Lance, MD  simvastatin (ZOCOR) 20 MG tablet Take 20 mg by mouth daily.  07/21/15   [provider]  TRADJENTA 5 MG TABS tablet Take 5 mg by mouth daily. 10/12/16   [provider]    Family History History reviewed. No pertinent family history.  Social History Social History   Tobacco Use  . Smoking status: Former Smoker    Types: Cigars    Last attempt to quit: 08/27/2011    Years since quitting: 6.0  . Smokeless tobacco: Never Used  Substance Use Topics  . Alcohol use: No  . Drug use: No     Allergies   Penicillins   Review of Systems Review of Systems  All other systems reviewed and are negative.    Physical Exam Updated Vital Signs BP (!) 141/92   Pulse 69   Temp 98 F (36.7 C) (Oral)   Ht 6\' 2"  (1.88 m)   Wt 97.5 kg (215 lb)   SpO2 98%   BMI 27.60 kg/m    Physical Exam  Constitutional: He is oriented to person, place, and time. He appears well-developed and well-nourished.  HENT:  Head: Normocephalic and atraumatic.  Eyes: Conjunctivae are normal.  Neck: Neck supple.  Cardiovascular: Normal rate.  Irregularly irregular rhythm  Pulmonary/Chest: Effort normal and breath sounds normal.  Abdominal: Soft. Bowel sounds are normal.  Musculoskeletal: Normal range of motion.  Neurological: He is alert and oriented to person, place, and time.  Skin: Skin is warm and dry.  Psychiatric: He has a normal mood and affect. His behavior is normal.  Nursing note and vitals reviewed.    ED Treatments / Results  Labs (all labs ordered are listed, but only abnormal  results are displayed) Labs Reviewed - No data to display  EKG  EKG Interpretation None       Radiology No results found.  Procedures Procedures (including critical care time)  Medications Ordered in ED Medications - No data to display   Initial Impression / Assessment and Plan / ED Course  I have reviewed the triage vital signs and the nursing notes.  Pertinent labs & imaging results that were available during my care of the patient were reviewed by me and considered in my medical decision making (see chart for details).    Patient was erroneously prescribed nitroglycerin 2% cream instead of 0.2%.  He became hypotensive, fatigued after application.  Symptoms have now abated.  Blood pressure normal.  No testing required.   Final Clinical Impressions(s) / ED Diagnoses   Final diagnoses:  Hypotension due to drugs    ED Discharge Orders    None       Nat Christen, MD 09/27/17 [phone]

## 2017-09-27 NOTE — Discharge Instructions (Signed)
No specific tests are necessary tonight.  Follow-up with your primary care doctor.  You can take the correct dose of your medication.

## 2017-12-27 ENCOUNTER — Telehealth: Payer: Self-pay | Admitting: Internal Medicine

## 2017-12-27 NOTE — Telephone Encounter (Signed)
Returned call to Pt.  Notified ok per Dr. Lovena Le for Pt to try hemp oil.  Per Pt it is taken orally.

## 2017-12-27 NOTE — Telephone Encounter (Signed)
Patient calling, states that he was having some back pain and his MD prescribed Hemp Oil. Patient was instructed to check with Dr. Lovena Le if it would be okay as patient has AFIB.

## 2018-02-21 ENCOUNTER — Ambulatory Visit (HOSPITAL_COMMUNITY)
Admission: RE | Admit: 2018-02-21 | Discharge: 2018-02-21 | Disposition: A | Discharge status: Home / Self Care | Payer: Medicare HMO | Source: Ambulatory Visit | Attending: Adult Health Nurse Practitioner | Admitting: Adult Health Nurse Practitioner

## 2018-02-21 ENCOUNTER — Other Ambulatory Visit (HOSPITAL_COMMUNITY): Payer: Self-pay | Admitting: Adult Health Nurse Practitioner

## 2018-02-21 DIAGNOSIS — M542 Cervicalgia: Secondary | ICD-10-CM

## 2018-02-21 DIAGNOSIS — M5030 Other cervical disc degeneration, unspecified cervical region: Secondary | ICD-10-CM | POA: Diagnosis not present

## 2018-04-19 ENCOUNTER — Encounter (INDEPENDENT_AMBULATORY_CARE_PROVIDER_SITE_OTHER): Payer: Medicare HMO | Admitting: Ophthalmology

## 2018-04-19 DIAGNOSIS — H43813 Vitreous degeneration, bilateral: Secondary | ICD-10-CM | POA: Diagnosis not present

## 2018-04-19 DIAGNOSIS — H26493 Other secondary cataract, bilateral: Secondary | ICD-10-CM

## 2018-04-19 DIAGNOSIS — E113293 Type 2 diabetes mellitus with mild nonproliferative diabetic retinopathy without macular edema, bilateral: Secondary | ICD-10-CM | POA: Diagnosis not present

## 2018-04-19 DIAGNOSIS — E11319 Type 2 diabetes mellitus with unspecified diabetic retinopathy without macular edema: Secondary | ICD-10-CM

## 2018-04-28 ENCOUNTER — Encounter (INDEPENDENT_AMBULATORY_CARE_PROVIDER_SITE_OTHER): Payer: Medicare HMO | Admitting: Ophthalmology

## 2018-04-28 DIAGNOSIS — H2701 Aphakia, right eye: Secondary | ICD-10-CM

## 2018-07-21 ENCOUNTER — Encounter: Payer: Self-pay | Admitting: Internal Medicine

## 2018-07-21 ENCOUNTER — Ambulatory Visit: Payer: Medicare HMO | Admitting: Internal Medicine

## 2018-07-21 VITALS — BP 122/78 | HR 76 | Ht 74.0 in | Wt 219.8 lb

## 2018-07-21 DIAGNOSIS — I4819 Other persistent atrial fibrillation: Secondary | ICD-10-CM

## 2018-07-21 NOTE — Progress Notes (Signed)
HPI Tyrone Mitchell returns today for ongoing evaluation and management of atrial fibrillation, hypertension, and chest pain. He is a very pleasant 77 year old man with a history of persistent atrial fibrillation for many years. 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 He has normal left ventricular function by heart catheterization. The patient has done well in the interim with no hospitalizations, chest pain, or shortness of breath. He does not have palpitations. No syncope. 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 Allergies  Allergen Reactions  . Penicillins Anaphylaxis    Swelling of the mouth  Has patient had a PCN reaction causing immediate rash, facial/tongue/throat swelling, SOB or lightheadedness with hypotension: yes Has patient had a PCN reaction causing severe rash involving mucus membranes or skin necrosis: {yes Has patient had a PCN reaction that required hospitalization {no Has patient had a PCN reaction occurring within the last 10 years: {no If all of the above answers are "NO", then may proceed with Cephalosporin use.     Current Outpatient Medications  Medication Sig Dispense Refill  . acetaminophen (TYLENOL) 500 MG tablet Take 500 mg by mouth 2 (two) times daily.    . ARTIFICIAL TEAR SOLUTION OP Place 1 drop into both eyes daily.     Marland Kitchen GLIPIZIDE XL 10 MG 24 hr tablet Take 10 mg by mouth 2 (two) times daily.     Marland Kitchen guaiFENesin (MUCINEX) 600 MG 12 hr tablet Take 600 mg by mouth 2 (two) times daily.    . metoprolol tartrate (LOPRESSOR) 25 MG tablet TAKE (1) TABLET TWICE A DAY. 180 tablet 3  . pantoprazole (PROTONIX) 40 MG tablet Take 1 tablet by mouth daily.    . ranitidine (ZANTAC) 150 MG tablet Take 150 mg by mouth 2 (two) times daily.    . repaglinide (PRANDIN) 2 MG tablet Take 2 mg by mouth as  directed. Take 1/2 hour before meals    . rivaroxaban (XARELTO) 20 MG TABS tablet Take 20 mg by mouth daily.    . simvastatin (ZOCOR) 20 MG tablet Take 20 mg by mouth daily.     . TRADJENTA 5 MG TABS tablet Take 5 mg by mouth daily.     No current facility-administered medications for this visit.      Past Medical History:  Diagnosis Date  . AAA (abdominal aortic aneurysm) ([place])    Abd U/S 4/14:  Infrarenal AAA 3.2 x 3.1 cm, bilat CIA mildly dilated with slight increase on L, mild aorto-iliac atherosclerosis w/o stenosis - f/u 1 year  . Anemia   . Arthritis   . Atrial fibrillation ([place])    a. on flecainide and coumadin  . Atrial flutter ([place])    a. s/p TEE/DCCV 09/2012, EF 50-55% by TEE.  . Cancer [facility]) 2010   prostate  . Complication of anesthesia    HAD DIFFICULTY WAKING AS CHILD  . Diabetes mellitus without complication ([place])   . GERD (gastroesophageal reflux disease)   . Hyperlipidemia   . Mild mitral and aortic regurgitation    a. by TEE 09/2012  . Visit for monitoring [name] therapy     ROS:   All systems reviewed and negative except as noted in the HPI.   Past Surgical History:  Procedure Laterality Date  . BACK SURGERY    . CARDIAC CATHETERIZATION N/A 10/22/2016   Procedure:  Left Heart Cath and Coronary Angiography;  Surgeon: Peter M Martinique, MD;  Location: [place] CV LAB;  Service: Cardiovascular;  Laterality: N/A;  . CARDIOVERSION  10/09/2012   Procedure: CARDIOVERSION;  Surgeon: Lelon Perla, MD;  Location: [facility] ENDOSCOPY;  Service: Cardiovascular;  Laterality: N/A;  . CARDIOVERSION N/A 07/17/2014   Procedure: CARDIOVERSION;  Surgeon: Deboraha Sprang, MD;  Location: [facility] CATH LAB;  Service: Cardiovascular;  Laterality: N/A;  . CHOLECYSTECTOMY    . HERNIA REPAIR    . hip replacement  09/12/2012   left  . HIP SURGERY    . PROSTATECTOMY    . TEE WITHOUT CARDIOVERSION  10/09/2012   Procedure: TRANSESOPHAGEAL ECHOCARDIOGRAM (TEE);  Surgeon: Lelon Perla,  MD;  Location: [facility] ENDOSCOPY;  Service: Cardiovascular;  Laterality: N/A;  . TONSILLECTOMY    . TOTAL HIP ARTHROPLASTY  09/12/2012   Procedure: TOTAL HIP ARTHROPLASTY ANTERIOR APPROACH;  Surgeon: Mauri Pole, MD;  Location: [facility] ORS;  Service: Orthopedics;  Laterality: Left;     History reviewed. No pertinent family history.   Social History   Socioeconomic History  . Marital status: Divorced    Spouse name: Not on file  . Number of children: Not on file  . Years of education: Not on file  . Highest education level: Not on file  Occupational History  . Not on file  Social Needs  . Financial resource strain: Not on file  . Food insecurity:    Worry: Not on file    Inability: Not on file  . Transportation needs:    Medical: Not on file    Non-medical: Not on file  Tobacco Use  . Smoking status: Former Smoker    Types: Cigars    Last attempt to quit: 08/27/2011    Years since quitting: 6.9  . Smokeless tobacco: Never Used  Substance and Sexual Activity  . Alcohol use: No  . Drug use: No  . Sexual activity: Not on file  Lifestyle  . Physical activity:    Days per week: Not on file    Minutes per session: Not on file  . Stress: Not on file  Relationships  . Social connections:    Talks on phone: Not on file    Gets together: Not on file    Attends religious service: Not on file    Active member of club or organization: Not on file    Attends meetings of clubs or organizations: Not on file    Relationship status: Not on file  . Intimate partner violence:    Fear of current or ex partner: Not on file    Emotionally abused: Not on file    Physically abused: Not on file    Forced sexual activity: Not on file  Other Topics Concern  . Not on file  Social History Narrative  . Not on file     BP 122/78   Pulse 76   Ht 6\' 2"  (1.88 m)   Wt 219 lb 12.8 oz (99.7 kg)   SpO2 99%   BMI 28.22 kg/m   Physical Exam:  Well appearing 77 yo man, NAD HEENT:  Unremarkable Neck:  No JVD, no thyromegally Lymphatics:  No adenopathy Back:  No CVA tenderness Lungs:  Clear with no wheezes HEART:  Regular rate rhythm, no murmurs, no rubs, no clicks Abd:  soft, positive bowel sounds, no organomegally, no rebound, no guarding Ext:  2 plus pulses, no edema, no cyanosis, no clubbing Skin:  No  rashes no nodules Neuro:  CN II through XII intact, motor grossly intact  EKG - atrial fib with a controlled VR   Assess/Plan: 1. Atrial fib - his ventricular rates are now well controlled. He will continue his current meds. 2. HTN - his blood pressure is well controlled. No changes 3. Weight gain - in the interim he has gained about 5 lbs. He is encouraged to lose 20 lbs.   Mikle Bosworth.D.

## 2018-07-21 NOTE — Patient Instructions (Signed)

## 2018-07-25 NOTE — Addendum Note (Signed)
Addended by: Rose Phi on: 07/25/2018 09:18 AM   Modules accepted: Orders

## 2018-07-31 ENCOUNTER — Other Ambulatory Visit: Payer: Self-pay | Admitting: Internal Medicine

## 2018-08-08 ENCOUNTER — Telehealth: Payer: Self-pay

## 2018-08-08 NOTE — Telephone Encounter (Signed)
   [facility] Pre-operative Risk Assessment    Request for surgical clearance:  1. What type of surgery is being performed? Colonoscopy   2. When is this surgery scheduled? 08/29/18   3. What type of clearance is required (medical clearance vs. Pharmacy clearance to hold med vs. Both)? Pharmacy  4. Are there any medications that need to be held prior to surgery and how long? Xarelto   5. Practice name and name of physician performing surgery? Dr. Earlean Shawl with [facility]    6. What is your office phone number [phone]    7.   What is your office fax number                [phone]  8.   Anesthesia type (None, local, MAC, general)

## 2018-08-08 NOTE — Telephone Encounter (Signed)
Pt takes Xarelto for afib with CHADS2VASc score of 6 (age x2, HTN, diastolic CHF, DM, nonobstructive CAD). SCr 1.13, CrCl 68mL/min. Recommend holding Xarelto for 24 hours prior to colonoscopy due to elevated cardia risk.

## 2018-08-09 NOTE — Telephone Encounter (Signed)
   Primary Cardiologist: Sinclair Grooms, MD  Chart reviewed as part of pre-operative protocol coverage. Pharmacy recommends holding xarelto 1 day prior to planned colonoscopy given elevated cardiac risk.   I will route this recommendation to the requesting party via Epic fax function and remove from pre-op pool.  Callback: - Please call Dr. Liliane Channel office to ensure they received pharmacy clearance for upcoming colonoscopy.   Abigail Butts, PA-C 08/09/2018, 1:40 PM

## 2018-08-09 NOTE — Telephone Encounter (Signed)
I tried to call the surgeon's office though no-one picked . I then left message for the pt to call back so that we may go over recommendations for the Xarelto to hold 1 day prior to procedure

## 2018-08-10 NOTE — Telephone Encounter (Signed)
Follow up  ° ° °Patient is returning your call. °

## 2018-08-11 ENCOUNTER — Telehealth: Payer: Self-pay | Admitting: Internal Medicine

## 2018-08-11 NOTE — Telephone Encounter (Signed)
Follow Up:; ° ° °Returning your call. °

## 2018-08-14 NOTE — Telephone Encounter (Addendum)
Spoke with Nira Conn from requesting office and they confirmed they have received pt's clearance with medication recommendations and pt was made aware

## 2018-08-21 ENCOUNTER — Telehealth: Payer: Self-pay | Admitting: Internal Medicine

## 2018-08-21 NOTE — Telephone Encounter (Signed)
° ° °  1. What dental office are you calling from? Dr Cicero Duck  2. What is your office phone number? [phone]  3. What is your fax number? [phone]  4. What type of procedure is the patient having performed? 2 extractions  5. What date is procedure scheduled or is the patient there now? TBD  6. What is your question (ex. Antibiotics prior to procedure, holding medication-we need to know how long dentist wants pt to hold med)? Hold any  medications?

## 2018-08-21 NOTE — Telephone Encounter (Signed)
Pt takes Xarelto for afib with CHADS2VASc score of 6 (age x2, HTN, diastolic CHF, DM, nonobstructive CAD). SCr 1.13, CrCl 48mL/min. Ok to hold 1 dose of Xarelto prior to 2 dental extractions. He does not require pre op antibiotics.

## 2018-11-01 ENCOUNTER — Other Ambulatory Visit: Payer: Self-pay | Admitting: Orthopedic Surgery

## 2018-11-01 ENCOUNTER — Other Ambulatory Visit (HOSPITAL_COMMUNITY): Payer: Self-pay | Admitting: Orthopedic Surgery

## 2018-11-01 DIAGNOSIS — Z96642 Presence of left artificial hip joint: Secondary | ICD-10-CM

## 2018-11-02 ENCOUNTER — Other Ambulatory Visit (HOSPITAL_COMMUNITY): Payer: Self-pay | Admitting: Orthopedic Surgery

## 2018-11-02 DIAGNOSIS — Z96642 Presence of left artificial hip joint: Secondary | ICD-10-CM

## 2018-11-09 ENCOUNTER — Ambulatory Visit (HOSPITAL_COMMUNITY)
Admission: RE | Admit: 2018-11-09 | Discharge: 2018-11-09 | Disposition: A | Discharge status: Home / Self Care | Payer: Medicare HMO | Source: Ambulatory Visit | Attending: Orthopedic Surgery | Admitting: Orthopedic Surgery

## 2018-11-09 ENCOUNTER — Encounter (HOSPITAL_COMMUNITY)
Admission: RE | Admit: 2018-11-09 | Discharge: 2018-11-09 | Disposition: A | Discharge status: Home / Self Care | Payer: Medicare HMO | Source: Ambulatory Visit | Attending: Orthopedic Surgery | Admitting: Orthopedic Surgery

## 2018-11-09 DIAGNOSIS — Z96642 Presence of left artificial hip joint: Secondary | ICD-10-CM | POA: Insufficient documentation

## 2018-11-09 MED ORDER — TECHNETIUM TC 99M MEDRONATE IV KIT
21.1000 | PACK | Freq: Once | INTRAVENOUS | Status: AC
  Administered 2018-11-09: 21.1 via INTRAVENOUS

## 2019-04-30 ENCOUNTER — Encounter (INDEPENDENT_AMBULATORY_CARE_PROVIDER_SITE_OTHER): Payer: Medicare HMO | Admitting: Ophthalmology

## 2019-04-30 ENCOUNTER — Other Ambulatory Visit: Payer: Self-pay

## 2019-04-30 DIAGNOSIS — E11319 Type 2 diabetes mellitus with unspecified diabetic retinopathy without macular edema: Secondary | ICD-10-CM

## 2019-04-30 DIAGNOSIS — H43813 Vitreous degeneration, bilateral: Secondary | ICD-10-CM

## 2019-04-30 DIAGNOSIS — E113213 Type 2 diabetes mellitus with mild nonproliferative diabetic retinopathy with macular edema, bilateral: Secondary | ICD-10-CM | POA: Diagnosis not present

## 2019-05-19 ENCOUNTER — Other Ambulatory Visit: Payer: Self-pay

## 2019-05-19 ENCOUNTER — Encounter (HOSPITAL_COMMUNITY): Payer: Self-pay | Admitting: *Deleted

## 2019-05-19 ENCOUNTER — Emergency Department (HOSPITAL_COMMUNITY)
Admission: EM | Admit: 2019-05-19 | Discharge: 2019-05-20 | Disposition: A | Discharge status: Home / Self Care | Payer: Medicare HMO | Attending: Emergency Medicine | Admitting: Emergency Medicine

## 2019-05-19 ENCOUNTER — Emergency Department (HOSPITAL_COMMUNITY): Payer: Medicare HMO

## 2019-05-19 DIAGNOSIS — R1013 Epigastric pain: Secondary | ICD-10-CM | POA: Insufficient documentation

## 2019-05-19 DIAGNOSIS — I10 Essential (primary) hypertension: Secondary | ICD-10-CM | POA: Insufficient documentation

## 2019-05-19 DIAGNOSIS — E119 Type 2 diabetes mellitus without complications: Secondary | ICD-10-CM | POA: Diagnosis not present

## 2019-05-19 DIAGNOSIS — R079 Chest pain, unspecified: Secondary | ICD-10-CM | POA: Diagnosis present

## 2019-05-19 DIAGNOSIS — Z7901 Long term (current) use of anticoagulants: Secondary | ICD-10-CM | POA: Diagnosis not present

## 2019-05-19 DIAGNOSIS — R0789 Other chest pain: Secondary | ICD-10-CM | POA: Insufficient documentation

## 2019-05-19 DIAGNOSIS — Z87891 Personal history of nicotine dependence: Secondary | ICD-10-CM | POA: Diagnosis not present

## 2019-05-19 DIAGNOSIS — I482 Chronic atrial fibrillation, unspecified: Secondary | ICD-10-CM | POA: Diagnosis not present

## 2019-05-19 DIAGNOSIS — Z7984 Long term (current) use of oral hypoglycemic drugs: Secondary | ICD-10-CM | POA: Insufficient documentation

## 2019-05-19 DIAGNOSIS — Z79899 Other long term (current) drug therapy: Secondary | ICD-10-CM | POA: Diagnosis not present

## 2019-05-19 DIAGNOSIS — Z96642 Presence of left artificial hip joint: Secondary | ICD-10-CM | POA: Insufficient documentation

## 2019-05-19 LAB — BASIC METABOLIC PANEL
Anion gap: 9 (ref 5–15)
BUN: 15 mg/dL (ref 8–23)
CO2: 25 mmol/L (ref 22–32)
Calcium: 9.5 mg/dL (ref 8.9–10.3)
Chloride: 103 mmol/L (ref 98–111)
Creatinine, Ser: 1.08 mg/dL (ref 0.61–1.24)
GFR calc Af Amer: >60 mL/min (ref >60)
GFR calc non Af Amer: >60 mL/min (ref >60)
Glucose, Bld: 148 mg/dL — ABNORMAL HIGH (ref 70–99)
Potassium: 4.4 mmol/L (ref 3.5–5.1)
Sodium: 137 mmol/L (ref 135–145)

## 2019-05-19 LAB — CBC
HCT: 45.6 % (ref 39.0–52.0)
Hemoglobin: 15 g/dL (ref 13.0–17.0)
MCH: 31.6 pg (ref 26.0–34.0)
MCHC: 32.9 g/dL (ref 30.0–36.0)
MCV: 96 fL (ref 80.0–100.0)
Platelets: 168 10*3/uL (ref 150–400)
RBC: 4.75 MIL/uL (ref 4.22–5.81)
RDW: 13.6 % (ref 11.5–15.5)
WBC: 10.4 10*3/uL (ref 4.0–10.5)
nRBC: 0 % (ref 0.0–0.2)

## 2019-05-19 LAB — TROPONIN I (HIGH SENSITIVITY)
Troponin I (High Sensitivity): 6 ng/L (ref <18)
Troponin I (High Sensitivity): 6 ng/L (ref <18)

## 2019-05-19 MED ORDER — SODIUM CHLORIDE 0.9% FLUSH: 3.0000 mL | Freq: Once | INTRAVENOUS | Status: DC

## 2019-05-19 MED ORDER — ALUM & MAG HYDROXIDE-SIMETH 200-200-20 MG/5ML PO SUSP
30.0000 mL | Freq: Once | ORAL | Status: AC
  Administered 2019-05-20: 30 mL via ORAL
  Filled 2019-05-19: qty 30

## 2019-05-19 MED ORDER — LIDOCAINE VISCOUS HCL 2 % MT SOLN
15.0000 mL | Freq: Once | OROMUCOSAL | Status: AC
  Administered 2019-05-20: 15 mL via ORAL
  Filled 2019-05-19: qty 15

## 2019-05-19 NOTE — ED Triage Notes (Signed)
Pt reports right sided chest pain and epigastric pain since he woke up this morning around 4am. Describes right sided chest pain as "deep" and worse when he exhales. Cough with white mucous for about a week, no fevers.

## 2019-05-19 NOTE — ED Provider Notes (Signed)
[facility] EMERGENCY DEPARTMENT Provider Note   CSN: [id number] Arrival date & time: 05/19/19  1912    History   Chief Complaint Chief Complaint  Patient presents with  . Chest Pain    HPI Tyrone Mitchell is a 78 y.o. male.   The history is provided by the patient.  Chest Pain He has history of diabetes, hypertension, hyperlipidemia, atrial fibrillation anticoagulated on rivaroxaban, abdominal aneurysm, GERD and comes in because of right-sided chest pain which woke him up at 4 AM.  He describes it as a deep pain which is sharp.  It is worse with exhalation, but not affected by body position.  He rates pain at 9/10.  Nothing makes it better, nothing makes it worse.  There is no associated dyspnea, nausea, diaphoresis.  He has been having some epigastric pain for the last several days and he is currently taking pantoprazole for his GERD.  He is a non-smoker.  Past Medical History:  Diagnosis Date  . AAA (abdominal aortic aneurysm) ([place])    Abd U/S 4/14:  Infrarenal AAA 3.2 x 3.1 cm, bilat CIA mildly dilated with slight increase on L, mild aorto-iliac atherosclerosis w/o stenosis - f/u 1 year  . Anemia   . Arthritis   . Atrial fibrillation ([place])    a. on flecainide and coumadin  . Atrial flutter ([place])    a. s/p TEE/DCCV 09/2012, EF 50-55% by TEE.  . Cancer [facility]) 2010   prostate  . Complication of anesthesia    HAD DIFFICULTY WAKING AS CHILD  . Diabetes mellitus without complication ([place])   . GERD (gastroesophageal reflux disease)   . Hyperlipidemia   . Mild mitral and aortic regurgitation    a. by TEE 09/2012  . Visit for monitoring [name] therapy     Patient Active Problem List   Diagnosis Date Noted  . Abnormal nuclear stress test 10/22/2016  . Back pain 08/06/2015  . Atrial fibrillation with controlled ventricular response ([place]) 07/15/2014  . Chronic anticoagulation 07/15/2014  . Atrial fibrillation, controlled ([place]) 07/15/2014  . Atrial  flutter with rapid ventricular response ([place]) 10/08/2012    Class: Acute  . Acute diastolic heart failure ([place]) 10/08/2012    Class: Acute  . Expected blood loss anemia 09/13/2012  . Overweight (BMI 25.0-29.9) 09/13/2012  . S/P left THA, AA 09/12/2012  . Palpitations 10/27/2011  . Shortness of breath 10/27/2011  . AAA (abdominal aortic aneurysm) ([place]) 10/27/2011  . Wound of right leg 08/23/2011  . Status post THR (total hip replacement) 08/18/2011  . Warfarin anticoagulation 08/18/2011  . Accident caused by unspecified machinery 08/16/2011  . Multiple closed fractures of ribs of right side 08/16/2011  . Crush injury elbow/forearm 08/16/2011  . 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 Tear of medial collateral ligament of knee 08/16/2011  . HYPERLIPIDEMIA-MIXED 12/18/2008  . TOBACCO ABUSE 12/18/2008  . HYPERTENSION, BENIGN 12/18/2008  . Atrial fibrillation ([place]) 12/18/2008    Past Surgical History:  Procedure Laterality Date  . BACK SURGERY    . CARDIAC CATHETERIZATION N/A 10/22/2016   Procedure: Left Heart Cath and Coronary Angiography;  Surgeon: Peter M Martinique, MD;  Location: [facility] CV LAB;  Service: Cardiovascular;  Laterality: N/A;  . CARDIOVERSION  10/09/2012   Procedure: CARDIOVERSION;  Surgeon: Lelon Perla, MD;  Location: [facility] ENDOSCOPY;  Service: Cardiovascular;  Laterality: N/A;  . CARDIOVERSION N/A 07/17/2014   Procedure: CARDIOVERSION;  Surgeon: Deboraha Sprang, MD;  Location:  [facility] CATH LAB;  Service: Cardiovascular;  Laterality: N/A;  . CHOLECYSTECTOMY    . HERNIA REPAIR    . hip replacement  09/12/2012   left  . HIP SURGERY    . PROSTATECTOMY    . TEE WITHOUT CARDIOVERSION  10/09/2012   Procedure: TRANSESOPHAGEAL ECHOCARDIOGRAM (TEE);  Surgeon: Lelon Perla, MD;  Location: [facility] ENDOSCOPY;  Service: Cardiovascular;  Laterality: N/A;  . TONSILLECTOMY    . TOTAL HIP ARTHROPLASTY  09/12/2012   Procedure: TOTAL HIP ARTHROPLASTY ANTERIOR  APPROACH;  Surgeon: Mauri Pole, MD;  Location: [facility] ORS;  Service: Orthopedics;  Laterality: Left;        Home Medications    Prior to Admission medications   Medication Sig Start Date End Date Taking? Authorizing Provider  acetaminophen (TYLENOL) 500 MG tablet Take 500 mg by mouth 2 (two) times daily.   Yes [provider]  ARTIFICIAL TEAR SOLUTION OP Place 1 drop into both eyes 2 (two) times daily.    Yes [provider]  GLIPIZIDE XL 10 MG 24 hr tablet Take 10 mg by mouth 2 (two) times daily.  09/06/16  Yes [provider]  metoprolol tartrate (LOPRESSOR) 25 MG tablet TAKE (1) TABLET TWICE A DAY. Patient taking differently: Take 25 mg by mouth 2 (two) times daily.  07/31/18  Yes Evans Lance, MD  pantoprazole (PROTONIX) 40 MG tablet Take 40 mg by mouth daily.  05/09/18  Yes [provider]  psyllium (METAMUCIL SMOOTH TEXTURE) 28 % packet Take 1 packet by mouth 2 (two) times daily.   Yes [provider]  rivaroxaban (XARELTO) 20 MG TABS tablet Take 20 mg by mouth daily. 12/26/13  Yes Evans Lance, MD  simvastatin (ZOCOR) 20 MG tablet Take 20 mg by mouth daily.  07/21/15  Yes [provider]  TRADJENTA 5 MG TABS tablet Take 5 mg by mouth daily. 10/12/16  Yes [provider]    Family History No family history on file.  Social History Social History   Tobacco Use  . Smoking status: Former Smoker    Types: Cigars    Quit date: 08/27/2011    Years since quitting: 7.7  . Smokeless tobacco: Never Used  Substance Use Topics  . Alcohol use: No  . Drug use: No     Allergies   Penicillins   Review of Systems Review of Systems  Cardiovascular: Positive for chest pain.  All other systems reviewed and are negative.    Physical Exam Updated Vital Signs BP (!) 160/95 (BP Location: Right Arm)   Pulse (!) 106   Temp 98.5 F (36.9 C) (Oral)   Resp 20   SpO2 98%   Physical Exam Vitals signs and nursing note  reviewed.    78 year old male, resting comfortably and in no acute distress. Vital signs are significant for mildly elevated heart rate and elevated blood pressure. Oxygen saturation is 98%, which is normal. Head is normocephalic and atraumatic. PERRLA, EOMI. Oropharynx is clear. Neck is nontender and supple without adenopathy or JVD. Back is nontender and there is no CVA tenderness. Lungs are clear without rales, wheezes, or rhonchi. Chest is nontender. Heart has an irregular rhythm without murmur. Abdomen is soft, flat, with tenderness fairly well localized in the epigastric area.  There is no rebound or guarding.  There are no masses or hepatosplenomegaly and peristalsis is normoactive. Extremities have no cyanosis or edema, full range of motion is present. Skin is warm and dry  without rash. Neurologic: Mental status is normal, cranial nerves are intact, there are no motor or sensory deficits.  ED Treatments / Results  Labs (all labs ordered are listed, but only abnormal results are displayed) Labs Reviewed  BASIC METABOLIC PANEL - Abnormal; Notable for the following components:      Result Value   Glucose, Bld 148 (*)    All other components within normal limits  CBC  TROPONIN I (HIGH SENSITIVITY)  TROPONIN I (HIGH SENSITIVITY)    EKG EKG Interpretation  Date/Time:  Saturday May 19 2019 19:17:59 EDT Ventricular Rate:  106 PR Interval:    QRS Duration: 84 QT Interval:  338 QTC Calculation: 448 R Axis:   11 Text Interpretation:  Atrial fibrillation with rapid ventricular response Nonspecific T wave abnormality , probably digitalis effect Abnormal ECG When compared with ECG of 10/22/2016, HEART RATE has increased Reconfirmed by Delora Fuel ([id number]) on 05/19/2019 10:53:04 PM   Radiology Dg Chest 2 View  Result Date: 05/19/2019 CLINICAL DATA:  Right-sided chest pain EXAM: CHEST - 2 VIEW COMPARISON:  08/11/2016 FINDINGS: Cardiac shadows within normal limits. Aortic  calcifications are seen. The lungs are well aerated bilaterally without focal infiltrate or sizable effusion. No bony abnormality is seen. IMPRESSION: No acute abnormality noted. Electronically Signed   By: Inez Catalina M.D.   On: 05/19/2019 20:07    Procedures Procedures   Medications Ordered in ED Medications  sodium chloride flush (NS) 0.9 % injection 3 mL (has no administration in time range)  sucralfate (CARAFATE) tablet 1 g (has no administration in time range)  pantoprazole (PROTONIX) EC tablet 40 mg (has no administration in time range)  alum & mag hydroxide-simeth (MAALOX/MYLANTA) 200-200-20 MG/5ML suspension 30 mL (30 mLs Oral Given 05/20/19 0016)    And  lidocaine (XYLOCAINE) 2 % viscous mouth solution 15 mL (15 mLs Oral Given 05/20/19 0016)     Initial Impression / Assessment and Plan / ED Course  I have reviewed the triage vital signs and the nursing notes.  Pertinent labs & imaging results that were available during my care of the patient were reviewed by me and considered in my medical decision making (see chart for details).  Right-sided chest pain of uncertain cause.  ECG shows no acute changes and initial troponin is normal, doubt ACS.  Repeat troponin is pending.  Currently anticoagulated, so pulmonary embolism very unlikely.  Chest x-ray shows no infiltrates or other acute changes.  He does have some epigastric tenderness and is treated for GERD, possible GI origin.  Will give a therapeutic trial of a GI cocktail.  Old records are reviewed confirming outpatient management of atrial fibrillation, no similar prior visits.  Repeat troponin is unchanged.  He had significant relief with GI cocktail.  Pain is felt most likely secondary to GERD.  He is taking pantoprazole, and is advised to increase that to twice a day.  He is also given prescription for sucralfate.  Follow-up with PCP.  Return precautions discussed.  Final Clinical Impressions(s) / ED Diagnoses   Final  diagnoses:  Atypical chest pain  Epigastric pain  Chronic atrial fibrillation  Chronic anticoagulation    ED Discharge Orders         Ordered    sucralfate (CARAFATE) 1 g tablet  3 times daily with meals & bedtime     05/20/19 2952           Delora Fuel, MD 84/13/24 [id number]

## 2019-05-20 MED ORDER — SUCRALFATE 1 G PO TABS: 1.0000 g | ORAL_TABLET | Freq: Once | ORAL | Status: DC

## 2019-05-20 MED ORDER — PANTOPRAZOLE SODIUM 40 MG PO TBEC: 40.0000 mg | DELAYED_RELEASE_TABLET | Freq: Once | ORAL | Status: DC

## 2019-05-20 MED ORDER — SUCRALFATE 1 G PO TABS: 1.0000 g | ORAL_TABLET | Freq: Three times a day (TID) | ORAL | 0 refills | Status: DC

## 2019-05-20 NOTE — ED Notes (Signed)
The pt has had chest pain since 0400am yesterday am.  No sob nausea or dizziness

## 2019-05-20 NOTE — Discharge Instructions (Addendum)
Please increase your pantoprazole ( Protonix) to twice a day.  Take antacids as needed.  Return if symptoms are getting worse.

## 2019-06-03 ENCOUNTER — Other Ambulatory Visit: Payer: Self-pay

## 2019-06-03 ENCOUNTER — Encounter (HOSPITAL_COMMUNITY): Payer: Self-pay | Admitting: Emergency Medicine

## 2019-06-03 ENCOUNTER — Emergency Department (HOSPITAL_COMMUNITY)
Admission: EM | Admit: 2019-06-03 | Discharge: 2019-06-03 | Disposition: A | Discharge status: Home / Self Care | Payer: Medicare HMO | Attending: Emergency Medicine | Admitting: Emergency Medicine

## 2019-06-03 DIAGNOSIS — Y92512 Supermarket, store or market as the place of occurrence of the external cause: Secondary | ICD-10-CM | POA: Insufficient documentation

## 2019-06-03 DIAGNOSIS — Z7984 Long term (current) use of oral hypoglycemic drugs: Secondary | ICD-10-CM | POA: Insufficient documentation

## 2019-06-03 DIAGNOSIS — Y999 Unspecified external cause status: Secondary | ICD-10-CM | POA: Insufficient documentation

## 2019-06-03 DIAGNOSIS — Y9389 Activity, other specified: Secondary | ICD-10-CM | POA: Insufficient documentation

## 2019-06-03 DIAGNOSIS — S91012A Laceration without foreign body, left ankle, initial encounter: Secondary | ICD-10-CM | POA: Insufficient documentation

## 2019-06-03 DIAGNOSIS — Z96642 Presence of left artificial hip joint: Secondary | ICD-10-CM | POA: Insufficient documentation

## 2019-06-03 DIAGNOSIS — Z87891 Personal history of nicotine dependence: Secondary | ICD-10-CM | POA: Diagnosis not present

## 2019-06-03 DIAGNOSIS — W228XXA Striking against or struck by other objects, initial encounter: Secondary | ICD-10-CM | POA: Diagnosis not present

## 2019-06-03 DIAGNOSIS — Z8546 Personal history of malignant neoplasm of prostate: Secondary | ICD-10-CM | POA: Insufficient documentation

## 2019-06-03 DIAGNOSIS — E119 Type 2 diabetes mellitus without complications: Secondary | ICD-10-CM | POA: Insufficient documentation

## 2019-06-03 DIAGNOSIS — Z79899 Other long term (current) drug therapy: Secondary | ICD-10-CM | POA: Diagnosis not present

## 2019-06-03 DIAGNOSIS — Z7901 Long term (current) use of anticoagulants: Secondary | ICD-10-CM | POA: Diagnosis not present

## 2019-06-03 MED ORDER — LIDOCAINE HCL (PF) 1 % IJ SOLN
INTRAMUSCULAR | Status: AC
  Administered 2019-06-03: 1 mL
  Filled 2019-06-03: qty 4

## 2019-06-03 MED ORDER — POVIDONE-IODINE 10 % EX SOLN: CUTANEOUS | Status: DC | PRN

## 2019-06-03 NOTE — ED Triage Notes (Signed)
Per EMS patient was shopping at [place] and was hit in back of left ankle by a stocking cart. Patient has laceration to back of left ankle. Patient takes blood thinner.
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
## 2019-06-03 NOTE — Discharge Instructions (Addendum)
Keep the wound clean with mild soap and water and keep it bandaged.  Sutures out in 8 to 10 days.  Return to ER for any signs of infection such as fever, chills, increasing pain, redness or swelling.

## 2019-07-20 ENCOUNTER — Other Ambulatory Visit: Payer: Self-pay | Admitting: Internal Medicine

## 2019-09-21 ENCOUNTER — Other Ambulatory Visit: Payer: Self-pay

## 2019-09-21 ENCOUNTER — Ambulatory Visit: Payer: Medicare HMO | Admitting: Internal Medicine

## 2019-09-21 ENCOUNTER — Encounter: Payer: Self-pay | Admitting: Internal Medicine

## 2019-09-21 VITALS — BP 110/78 | HR 72 | Ht 74.0 in | Wt 215.4 lb

## 2019-09-21 DIAGNOSIS — R0600 Dyspnea, unspecified: Secondary | ICD-10-CM | POA: Diagnosis not present

## 2019-09-21 DIAGNOSIS — I10 Essential (primary) hypertension: Secondary | ICD-10-CM

## 2019-09-21 DIAGNOSIS — R05 Cough: Secondary | ICD-10-CM

## 2019-09-21 DIAGNOSIS — I482 Chronic atrial fibrillation, unspecified: Secondary | ICD-10-CM

## 2019-09-21 DIAGNOSIS — R059 Cough, unspecified: Secondary | ICD-10-CM

## 2019-09-21 NOTE — Progress Notes (Signed)
HPI Tyrone Mitchell returns today for followup. He is a pleasant 78 yo man with permanent atrial fib, diastolic heart failure, and non-obstructive CAD. He has done well in the interim except he has developed a cough and sob. In the interim, he denies fever,chills, and his cough is productive for white phlegm. He does not have a h/o COPD. He was a former cigar smoker, stopping over 8 years ago. No angina. No edema. Allergies  Allergen Reactions  . Penicillins Anaphylaxis    Swelling of the mouth  Has patient had a PCN reaction causing immediate rash, facial/tongue/throat swelling, SOB or lightheadedness with hypotension: yes Has patient had a PCN reaction causing severe rash involving mucus membranes or skin necrosis: {yes Has patient had a PCN reaction that required hospitalization {no Has patient had a PCN reaction occurring within the last 10 years: {no If all of the above answers are "NO", then may proceed with Cephalosporin use.     Current Outpatient Medications  Medication Sig Dispense Refill  . acetaminophen (TYLENOL) 500 MG tablet Take 500 mg by mouth 2 (two) times daily.    . ARTIFICIAL TEAR SOLUTION OP Place 1 drop into both eyes 2 (two) times daily.     Marland Kitchen GLIPIZIDE XL 10 MG 24 hr tablet Take 10 mg by mouth 2 (two) times daily.     . metoprolol tartrate (LOPRESSOR) 25 MG tablet Take 1 tablet (25 mg total) by mouth 2 (two) times daily. Please keep upcoming appt in December with Dr. Lovena Le before anymore refills. Thank you 180 tablet 0  . pantoprazole (PROTONIX) 40 MG tablet Take 40 mg by mouth daily.     . psyllium (METAMUCIL SMOOTH TEXTURE) 28 % packet Take 1 packet by mouth 2 (two) times daily.    . rivaroxaban (XARELTO) 20 MG TABS tablet Take 20 mg by mouth daily.    . simvastatin (ZOCOR) 20 MG tablet Take 20 mg by mouth daily.     . TRADJENTA 5 MG TABS tablet Take 5 mg by mouth daily.     No current facility-administered medications for this visit.     Past Medical  History:  Diagnosis Date  . AAA (abdominal aortic aneurysm) ([place])    Abd U/S 4/14:  Infrarenal AAA 3.2 x 3.1 cm, bilat CIA mildly dilated with slight increase on L, mild aorto-iliac atherosclerosis w/o stenosis - f/u 1 year  . Anemia   . Arthritis   . Atrial fibrillation ([place])    a. on flecainide and coumadin  . Atrial flutter ([place])    a. s/p TEE/DCCV 09/2012, EF 50-55% by TEE.  . Cancer [facility]) 2010   prostate  . Complication of anesthesia    HAD DIFFICULTY WAKING AS CHILD  . Diabetes mellitus without complication ([place])   . GERD (gastroesophageal reflux disease)   . Hyperlipidemia   . Mild mitral and aortic regurgitation    a. by TEE 09/2012  . Visit for monitoring [name] therapy     ROS:   All systems reviewed and negative except as noted in the HPI.   Past Surgical History:  Procedure Laterality Date  . BACK SURGERY    . CARDIAC CATHETERIZATION N/A 10/22/2016   Procedure: Left Heart Cath and Coronary Angiography;  Surgeon: Peter M Martinique, MD;  Location: [place] CV LAB;  Service: Cardiovascular;  Laterality: N/A;  . CARDIOVERSION  10/09/2012   Procedure: CARDIOVERSION;  Surgeon: Lelon Perla, MD;  Location: [place];  Service: Cardiovascular;  Laterality: N/A;  . CARDIOVERSION N/A 07/17/2014   Procedure: CARDIOVERSION;  Surgeon: Deboraha Sprang, MD;  Location: [facility] CATH LAB;  Service: Cardiovascular;  Laterality: N/A;  . CHOLECYSTECTOMY    . HERNIA REPAIR    . hip replacement  09/12/2012   left  . HIP SURGERY    . PROSTATECTOMY    . TEE WITHOUT CARDIOVERSION  10/09/2012   Procedure: TRANSESOPHAGEAL ECHOCARDIOGRAM (TEE);  Surgeon: Lelon Perla, MD;  Location: [facility] ENDOSCOPY;  Service: Cardiovascular;  Laterality: N/A;  . TONSILLECTOMY    . TOTAL HIP ARTHROPLASTY  09/12/2012   Procedure: TOTAL HIP ARTHROPLASTY ANTERIOR APPROACH;  Surgeon: Mauri Pole, MD;  Location: [facility] ORS;  Service: Orthopedics;  Laterality: Left;     No family history on file.   Social  History   Socioeconomic History  . Marital status: Divorced    Spouse name: Not on file  . Number of children: Not on file  . Years of education: Not on file  . Highest education level: Not on file  Occupational History  . Not on file  Tobacco Use  . Smoking status: Former Smoker    Types: Cigars    Quit date: 08/27/2011    Years since quitting: 8.0  . Smokeless tobacco: Never Used  Substance and Sexual Activity  . Alcohol use: No  . Drug use: No  . Sexual activity: Not on file  Other Topics Concern  . Not on file  Social History Narrative  . Not on file   Social Determinants of Health   Financial Resource Strain:   . Difficulty of Paying Living Expenses: Not on file  Food Insecurity:   . Worried About Charity fundraiser in the Last Year: Not on file  . Ran Out of Food in the Last Year: Not on file  Transportation Needs:   . Lack of Transportation (Medical): Not on file  . Lack of Transportation (Non-Medical): Not on file  Physical Activity:   . Days of Exercise per Week: Not on file  . Minutes of Exercise per Session: Not on file  Stress:   . Feeling of Stress : Not on file  Social Connections:   . Frequency of Communication with Friends and Family: Not on file  . Frequency of Social Gatherings with Friends and Family: Not on file  . Attends Religious Services: Not on file  . Active Member of Clubs or Organizations: Not on file  . Attends Archivist Meetings: Not on file  . Marital Status: Not on file  Intimate Partner Violence:   . Fear of Current or Ex-Partner: Not on file  . Emotionally Abused: Not on file  . Physically Abused: Not on file  . Sexually Abused: Not on file     BP 110/78   Pulse 72   Ht 6\' 2"  (1.88 m)   Wt 215 lb 6.4 oz (97.7 kg)   SpO2 97%   BMI 27.66 kg/m   Physical Exam:  Well appearing NAD HEENT: Unremarkable Neck:  No JVD, no thyromegally Lymphatics:  No adenopathy Back:  No CVA tenderness Lungs:  Clear with no  wheezes HEART:  IRegular rate rhythm, no murmurs, no rubs, no clicks Abd:  soft, positive bowel sounds, no organomegally, no rebound, no guarding Ext:  2 plus pulses, no edema, no cyanosis, no clubbing Skin:  No rashes no nodules Neuro:  CN II through XII intact, motor grossly intact  EKG - atrial fib with a controlled VR  Assess/Plan: 1. Atrial fib - his VR appears to be well controlled.  2. Cough/sob - etiology is unclear. I will ask him to obtain PFT's with a DLCO, and a 2D echo. Additional rec's to follow. 3. HTN - his bp is well controlled. No change in his meds.  Mikle [email or web address].D.

## 2019-09-21 NOTE — Patient Instructions (Signed)
Medication Instructions:  Continue current medications *If you need a refill on your cardiac medications before your next appointment, please call your pharmacy*  Lab Work: None ordered.  If you have labs (blood work) drawn today and your tests are completely normal, you will receive your results only by: Marland Kitchen MyChart Message (if you have MyChart) OR . A paper copy in the mail If you have any lab test that is abnormal or we need to change your treatment, we will call you to review the results.  Testing/Procedures: Your physician has recommended that you have a pulmonary function test. Pulmonary Function Tests are a group of tests that measure how well air moves in and out of your lungs.  Please schedule for PFT  Your physician has requested that you have an echocardiogram. Echocardiography is a painless test that uses sound waves to create images of your heart. It provides your doctor with information about the size and shape of your heart and how well your heart's chambers and valves are working. This procedure takes approximately one hour. There are no restrictions for this procedure.  Please schedule for echo   Follow-Up: At [facility], you and your health needs are our priority.  As part of our continuing mission to provide you with exceptional heart care, we have created designated Provider Care Teams.  These Care Teams include your primary Cardiologist (physician) and Advanced Practice Providers (APPs -  Physician Assistants and Nurse Practitioners) who all work together to provide you with the care you need, when you need it.  Your next appointment:   You will follow up with Dr Lovena Le in one year unless we need to see you sooner based on the results of your tests.

## 2019-10-01 ENCOUNTER — Other Ambulatory Visit (HOSPITAL_COMMUNITY): Payer: Medicare HMO

## 2019-10-15 ENCOUNTER — Other Ambulatory Visit: Payer: Self-pay | Admitting: Internal Medicine

## 2019-10-16 ENCOUNTER — Telehealth: Payer: Self-pay | Admitting: Internal Medicine

## 2019-10-16 NOTE — Telephone Encounter (Signed)
Pt wanted to know when he would be able to get the covid vaccine.  Advised pt we have no information on that.  Encouraged pt to reach out to PCP or local health dept.  Pt appreciative for call.

## 2019-10-16 NOTE — Telephone Encounter (Signed)
New Message:      Please call, questions about the COVID Vaccine.

## 2019-11-12 ENCOUNTER — Other Ambulatory Visit (HOSPITAL_COMMUNITY): Payer: Medicare HMO

## 2020-04-30 ENCOUNTER — Encounter (INDEPENDENT_AMBULATORY_CARE_PROVIDER_SITE_OTHER): Payer: Medicare HMO | Admitting: Ophthalmology

## 2020-06-30 ENCOUNTER — Telehealth: Payer: Self-pay | Admitting: Internal Medicine

## 2020-06-30 NOTE — Telephone Encounter (Signed)
I spoke with patient and scheduled him to see Dr Lovena Le on 07/02/20 at 11:15.  Patient denies any chest pain at this time.  I advised him to call 911 if he developed chest pain or any change in symptoms

## 2020-06-30 NOTE — Telephone Encounter (Signed)
New message:     Patient calling stating he a sooner apt to see the doctor. Patient primary doctor told him he need to see the doctor ASAP. Please call patient.

## 2020-06-30 NOTE — Telephone Encounter (Signed)
I spoke with patient. He reports he has not been feeling well for awhile.  Diarrhea, shortness of breath, sweats.  He saw PCP on Friday and lab work was done.  Covid test was negative.  PCP told him he had elevated coronary enzyme and he needed to see cardiologist. Patient denies chest pain.  He has shortness of breath at times.  States it comes and goes.  Occurs with exertion but not every time he exerts himself. He has dental appointment for filling on Thursday. I told patient I would forward message to Dr Tanna Furry nurse and she would contact him regarding appointment.

## 2020-07-02 ENCOUNTER — Encounter: Payer: Self-pay | Admitting: Internal Medicine

## 2020-07-02 ENCOUNTER — Other Ambulatory Visit: Payer: Self-pay

## 2020-07-02 ENCOUNTER — Ambulatory Visit: Payer: Medicare HMO | Admitting: Internal Medicine

## 2020-07-02 VITALS — BP 112/72 | HR 79 | Ht 74.0 in | Wt 219.6 lb

## 2020-07-02 DIAGNOSIS — R0600 Dyspnea, unspecified: Secondary | ICD-10-CM | POA: Diagnosis not present

## 2020-07-02 DIAGNOSIS — I482 Chronic atrial fibrillation, unspecified: Secondary | ICD-10-CM | POA: Diagnosis not present

## 2020-07-02 DIAGNOSIS — I10 Essential (primary) hypertension: Secondary | ICD-10-CM | POA: Diagnosis not present

## 2020-07-02 NOTE — Telephone Encounter (Signed)
noted 

## 2020-07-02 NOTE — Patient Instructions (Addendum)
Medication Instructions:  Your physician recommends that you continue on your current medications as directed. Please refer to the Current Medication list given to you today.  Labwork: None ordered.  Testing/Procedures: Your physician has requested that you have an echocardiogram. Echocardiography is a painless test that uses sound waves to create images of your heart. It provides your doctor with information about the size and shape of your heart and how well your heart's chambers and valves are working. This procedure takes approximately one hour. There are no restrictions for this procedure.  Please schedule for ECHO  Your physician has recommended that you have a pulmonary function test. Pulmonary Function Tests are a group of tests that measure how well air moves in and out of your lungs.  Please schedule for PFT's  Follow-Up: Your physician wants you to follow-up based on results of your tests.  Any Other Special Instructions Will Be Listed Below (If Applicable).  If you need a refill on your cardiac medications before your next appointment, please call your pharmacy.

## 2020-07-02 NOTE — Progress Notes (Signed)
HPI [name] returns today for ongoing evaluation of dyspnea.  He is a very pleasant 79 year old man with a longstanding history of chronic atrial fibrillation.  The patient was seen by me approximately 9 months ago and at that time complained of shortness of breath and cough.  He was in rate controlled atrial fibrillation.  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  He notes that he has severe shortness of breath when he tries to use a sledgehammer.  He has not had syncope.  He denies anginal symptoms.  He admits to the area where he is working and being very dusty. Allergies  Allergen Reactions  . Penicillins Anaphylaxis    Swelling of the mouth  Has patient had a PCN reaction causing immediate rash, facial/tongue/throat swelling, SOB or lightheadedness with hypotension: yes Has patient had a PCN reaction causing severe rash involving mucus membranes or skin necrosis: {yes Has patient had a PCN reaction that required hospitalization {no Has patient had a PCN reaction occurring within the last 10 years: {no If all of the above answers are "NO", then may proceed with Cephalosporin use.     Current Outpatient Medications  Medication Sig Dispense Refill  . acetaminophen (TYLENOL) 500 MG tablet Take 500 mg by mouth 2 (two) times daily.    . ARTIFICIAL TEAR SOLUTION OP Place 1 drop into both eyes 2 (two) times daily.     Marland Kitchen GLIPIZIDE XL 10 MG 24 hr tablet Take 10 mg by mouth 2 (two) times daily.     . metoprolol tartrate (LOPRESSOR) 25 MG tablet TAKE (1) TABLET TWICE A DAY. 180 tablet 3  . pantoprazole (PROTONIX) 40 MG tablet Take 40 mg by mouth daily.     . psyllium (METAMUCIL SMOOTH TEXTURE) 28 % packet Take 1 packet by mouth 2 (two) times daily.    . rivaroxaban (XARELTO) 20 MG TABS tablet Take 20 mg by mouth daily.    . simvastatin (ZOCOR) 20 MG tablet Take 20 mg by mouth daily.     . TRADJENTA 5 MG TABS tablet Take 5 mg by mouth daily.     No current  facility-administered medications for this visit.     Past Medical History:  Diagnosis Date  . AAA (abdominal aortic aneurysm) ([place])    Abd U/S 4/14:  Infrarenal AAA 3.2 x 3.1 cm, bilat CIA mildly dilated with slight increase on L, mild aorto-iliac atherosclerosis w/o stenosis - f/u 1 year  . Anemia   . Arthritis   . Atrial fibrillation ([place])    a. on flecainide and coumadin  . Atrial flutter ([place])    a. s/p TEE/DCCV 09/2012, EF 50-55% by TEE.  . Cancer [facility]) 2010   prostate  . Complication of anesthesia    HAD DIFFICULTY WAKING AS CHILD  . Diabetes mellitus without complication ([place])   . GERD (gastroesophageal reflux disease)   . Hyperlipidemia   . Mild mitral and aortic regurgitation    a. by TEE 09/2012  . Visit for monitoring [name] therapy     ROS:   All systems reviewed and negative except as noted in the HPI.   Past Surgical History:  Procedure Laterality Date  . BACK SURGERY    . CARDIAC CATHETERIZATION N/A 10/22/2016   Procedure: Left Heart Cath and Coronary Angiography;  Surgeon: Peter M Martinique, MD;  Location: [place] CV LAB;  Service: Cardiovascular;  Laterality: N/A;  . CARDIOVERSION  10/09/2012   Procedure: CARDIOVERSION;  Surgeon: Lelon Perla, MD;  Location: [facility] ENDOSCOPY;  Service: Cardiovascular;  Laterality: N/A;  . CARDIOVERSION N/A 07/17/2014   Procedure: CARDIOVERSION;  Surgeon: Deboraha Sprang, MD;  Location: [facility] CATH LAB;  Service: Cardiovascular;  Laterality: N/A;  . CHOLECYSTECTOMY    . HERNIA REPAIR    . hip replacement  09/12/2012   left  . HIP SURGERY    . PROSTATECTOMY    . TEE WITHOUT CARDIOVERSION  10/09/2012   Procedure: TRANSESOPHAGEAL ECHOCARDIOGRAM (TEE);  Surgeon: Lelon Perla, MD;  Location: [facility] ENDOSCOPY;  Service: Cardiovascular;  Laterality: N/A;  . TONSILLECTOMY    . TOTAL HIP ARTHROPLASTY  09/12/2012   Procedure: TOTAL HIP ARTHROPLASTY ANTERIOR APPROACH;  Surgeon: Mauri Pole, MD;  Location: [facility] ORS;  Service:  Orthopedics;  Laterality: Left;     No family history on file.   Social History   Socioeconomic History  . Marital status: Divorced    Spouse name: Not on file  . Number of children: Not on file  . Years of education: Not on file  . Highest education level: Not on file  Occupational History  . Not on file  Tobacco Use  . Smoking status: Former Smoker    Types: Cigars    Quit date: 08/27/2011    Years since quitting: 8.8  . Smokeless tobacco: Never Used  Vaping Use  . Vaping Use: Never used  Substance and Sexual Activity  . Alcohol use: No  . Drug use: No  . Sexual activity: Not on file  Other Topics Concern  . Not on file  Social History Narrative  . Not on file   Social Determinants of Health   Financial Resource Strain:   . Difficulty of Paying Living Expenses: Not on file  Food Insecurity:   . Worried About Charity fundraiser in the Last Year: Not on file  . Ran Out of Food in the Last Year: Not on file  Transportation Needs:   . Lack of Transportation (Medical): Not on file  . Lack of Transportation (Non-Medical): Not on file  Physical Activity:   . Days of Exercise per Week: Not on file  . Minutes of Exercise per Session: Not on file  Stress:   . Feeling of Stress : Not on file  Social Connections:   . Frequency of Communication with Friends and Family: Not on file  . Frequency of Social Gatherings with Friends and Family: Not on file  . Attends Religious Services: Not on file  . Active Member of Clubs or Organizations: Not on file  . Attends Archivist Meetings: Not on file  . Marital Status: Not on file  Intimate Partner Violence:   . Fear of Current or Ex-Partner: Not on file  . Emotionally Abused: Not on file  . Physically Abused: Not on file  . Sexually Abused: Not on file     BP 112/72   Pulse 79   Ht 6\' 2"  (1.88 m)   Wt 219 lb 9.6 oz (99.6 kg)   SpO2 91%   BMI 28.19 kg/m   Physical Exam:  Well appearing 79 year old man,  NAD HEENT: Unremarkable Neck:  No JVD, no thyromegally Lymphatics:  No adenopathy Back:  No CVA tenderness Lungs:  Clear HEART:  IRegular rate rhythm, no murmurs, no rubs, no clicks Abd:  soft, positive bowel sounds, no organomegally, no rebound, no guarding Ext:  2 plus pulses, no edema, no cyanosis, no clubbing Skin:  No rashes no  nodules Neuro:  CN II through XII intact, motor grossly intact  EKG -atrial fibrillation with a controlled ventricular response   Assess/Plan: 1.  Atrial fibrillation -his ventricular rate appears to be well controlled.  I considered having him wear a cardiac monitor but will hold off on this for now. 2.  Shortness of breath -the etiology is unclear.  I have asked the patient undergo pulmonary function testing with DLCO.  In addition he will undergo 2D echo to evaluate his pulmonary pressures. 3.  Hypertension -his blood pressure is currently well controlled.  No change in medications. 4.  Systemic anticoagulation -he will continue Xarelto.  No change in medications.

## 2020-07-28 ENCOUNTER — Ambulatory Visit (HOSPITAL_COMMUNITY): Payer: Medicare HMO | Attending: Cardiology

## 2020-07-28 ENCOUNTER — Other Ambulatory Visit: Payer: Self-pay

## 2020-07-28 ENCOUNTER — Ambulatory Visit (INDEPENDENT_AMBULATORY_CARE_PROVIDER_SITE_OTHER): Payer: Medicare HMO | Admitting: Internal Medicine

## 2020-07-28 DIAGNOSIS — R0600 Dyspnea, unspecified: Secondary | ICD-10-CM

## 2020-07-28 DIAGNOSIS — I482 Chronic atrial fibrillation, unspecified: Secondary | ICD-10-CM

## 2020-07-28 DIAGNOSIS — I10 Essential (primary) hypertension: Secondary | ICD-10-CM

## 2020-07-28 LAB — PULMONARY FUNCTION TEST
DL/VA % pred: 73 %
DL/VA: 2.86 ml/min/mmHg/L
DLCO cor % pred: 73 %
DLCO cor: 19.87 ml/min/mmHg
DLCO unc % pred: 73 %
DLCO unc: 19.87 ml/min/mmHg
FEF 25-75 Post: 3.01 L/sec
FEF 25-75 Pre: 2.69 L/sec
FEF2575-%Change-Post: 11 %
FEF2575-%Pred-Post: 127 %
FEF2575-%Pred-Pre: 114 %
FEV1-%Change-Post: 4 %
FEV1-%Pred-Post: 91 %
FEV1-%Pred-Pre: 87 %
FEV1-Post: 3.05 L
FEV1-Pre: 2.91 L
FEV1FVC-%Change-Post: 0 %
FEV1FVC-%Pred-Pre: 108 %
FEV6-%Change-Post: 5 %
FEV6-%Pred-Post: 89 %
FEV6-%Pred-Pre: 84 %
FEV6-Post: 3.87 L
FEV6-Pre: 3.67 L
FEV6FVC-%Change-Post: 0 %
FEV6FVC-%Pred-Post: 105 %
FEV6FVC-%Pred-Pre: 106 %
FVC-%Change-Post: 4 %
FVC-%Pred-Post: 84 %
FVC-%Pred-Pre: 80 %
FVC-Post: 3.9 L
FVC-Pre: 3.73 L
Post FEV1/FVC ratio: 78 %
Post FEV6/FVC ratio: 99 %
Pre FEV1/FVC ratio: 78 %
Pre FEV6/FVC Ratio: 100 %
RV % pred: 121 %
RV: 3.38 L
TLC % pred: 92 %
TLC: 7.07 L

## 2020-07-28 LAB — ECHOCARDIOGRAM COMPLETE: S' Lateral: 2.8 cm

## 2020-07-28 NOTE — Progress Notes (Signed)
PFT done today. 

## 2020-08-15 ENCOUNTER — Ambulatory Visit: Payer: Medicare HMO | Admitting: Internal Medicine

## 2020-08-15 ENCOUNTER — Other Ambulatory Visit: Payer: Self-pay

## 2020-08-15 ENCOUNTER — Encounter: Payer: Self-pay | Admitting: Internal Medicine

## 2020-08-15 VITALS — BP 134/72 | HR 69 | Ht 74.0 in | Wt 221.4 lb

## 2020-08-15 DIAGNOSIS — I482 Chronic atrial fibrillation, unspecified: Secondary | ICD-10-CM | POA: Diagnosis not present

## 2020-08-15 DIAGNOSIS — I10 Essential (primary) hypertension: Secondary | ICD-10-CM | POA: Diagnosis not present

## 2020-08-15 DIAGNOSIS — R0602 Shortness of breath: Secondary | ICD-10-CM

## 2020-08-15 MED ORDER — ISOSORBIDE MONONITRATE ER 30 MG PO TB24: 30.0000 mg | ORAL_TABLET | Freq: Every day | ORAL | 11 refills | Status: DC | PRN

## 2020-08-15 NOTE — Progress Notes (Signed)
HPI Mr. Crotty returns today for ongoing evaluation and management of shortness of breath.  He is a very pleasant 79 year old man with a longstanding history of atrial fibrillation.  It is now permanent.  His ventricular rates have been fairly well controlled.  He has had problems with dyspnea and atypical chest pain.  Most recently, he has not had chest pain, but shortness of breath with exertion.  3 years ago he underwent left heart catheterization which demonstrated a 60% LAD stenosis and an 80% first diagonal stenosis.  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  He has undergone 2D echocardiography which demonstrates preserved left ventricular systolic function.  Pulmonary function testing was carried out demonstrating a mild diffusion defect.  He does not have angina.  He denies peripheral edema.  He has class II, bordering on 3 dyspnea.  He denies dietary indiscretion.  He is not on a diuretic. Allergies  Allergen Reactions  . Penicillins Anaphylaxis    Swelling of the mouth  Has patient had a PCN reaction causing immediate rash, facial/tongue/throat swelling, SOB or lightheadedness with hypotension: yes Has patient had a PCN reaction causing severe rash involving mucus membranes or skin necrosis: {yes Has patient had a PCN reaction that required hospitalization {no Has patient had a PCN reaction occurring within the last 10 years: {no If all of the above answers are "NO", then may proceed with Cephalosporin use.     Current Outpatient Medications  Medication Sig Dispense Refill  . acetaminophen (TYLENOL) 500 MG tablet Take 500 mg by mouth 2 (two) times daily.    . ARTIFICIAL TEAR SOLUTION OP Place 1 drop into both eyes 2 (two) times daily.     Marland Kitchen GLIPIZIDE XL 10 MG 24 hr tablet Take 10 mg by mouth 2 (two) times daily.     Marland Kitchen levocetirizine (XYZAL) 5 MG tablet Take 1 tablet by mouth every evening.    . metoprolol tartrate  (LOPRESSOR) 25 MG tablet TAKE (1) TABLET TWICE A DAY. 180 tablet 3  . pantoprazole (PROTONIX) 40 MG tablet Take 40 mg by mouth daily.     . psyllium (METAMUCIL SMOOTH TEXTURE) 28 % packet Take 1 packet by mouth 2 (two) times daily.    . rivaroxaban (XARELTO) 20 MG TABS tablet Take 20 mg by mouth daily.    . simvastatin (ZOCOR) 20 MG tablet Take 20 mg by mouth daily.     . TRADJENTA 5 MG TABS tablet Take 5 mg by mouth daily.    . traMADol (ULTRAM) 50 MG tablet Take 50 mg by mouth every 6 (six) hours as needed for pain.     No current facility-administered medications for this visit.     Past Medical History:  Diagnosis Date  . AAA (abdominal aortic aneurysm) ([place])    Abd U/S 4/14:  Infrarenal AAA 3.2 x 3.1 cm, bilat CIA mildly dilated with slight increase on L, mild aorto-iliac atherosclerosis w/o stenosis - f/u 1 year  . Anemia   . Arthritis   . Atrial fibrillation ([place])    a. on flecainide and coumadin  . Atrial flutter ([place])    a. s/p TEE/DCCV 09/2012, EF 50-55% by TEE.  . Cancer [facility]) 2010   prostate  . Complication of anesthesia    HAD DIFFICULTY WAKING AS CHILD  . Diabetes mellitus without complication ([place])   . GERD (gastroesophageal reflux disease)   . Hyperlipidemia   . Mild  mitral and aortic regurgitation    a. by TEE 09/2012  . Visit for monitoring [name] therapy     ROS:   All systems reviewed and negative except as noted in the HPI.   Past Surgical History:  Procedure Laterality Date  . BACK SURGERY    . CARDIAC CATHETERIZATION N/A 10/22/2016   Procedure: Left Heart Cath and Coronary Angiography;  Surgeon: Peter M Martinique, MD;  Location: [facility] CV LAB;  Service: Cardiovascular;  Laterality: N/A;  . CARDIOVERSION  10/09/2012   Procedure: CARDIOVERSION;  Surgeon: Lelon Perla, MD;  Location: [facility] ENDOSCOPY;  Service: Cardiovascular;  Laterality: N/A;  . CARDIOVERSION N/A 07/17/2014   Procedure: CARDIOVERSION;  Surgeon: Deboraha Sprang, MD;  Location: [facility]  CATH LAB;  Service: Cardiovascular;  Laterality: N/A;  . CHOLECYSTECTOMY    . HERNIA REPAIR    . hip replacement  09/12/2012   left  . HIP SURGERY    . PROSTATECTOMY    . TEE WITHOUT CARDIOVERSION  10/09/2012   Procedure: TRANSESOPHAGEAL ECHOCARDIOGRAM (TEE);  Surgeon: Lelon Perla, MD;  Location: [facility] ENDOSCOPY;  Service: Cardiovascular;  Laterality: N/A;  . TONSILLECTOMY    . TOTAL HIP ARTHROPLASTY  09/12/2012   Procedure: TOTAL HIP ARTHROPLASTY ANTERIOR APPROACH;  Surgeon: Mauri Pole, MD;  Location: [facility] ORS;  Service: Orthopedics;  Laterality: Left;     History reviewed. No pertinent family history.   Social History   Socioeconomic History  . Marital status: Divorced    Spouse name: Not on file  . Number of children: Not on file  . Years of education: Not on file  . Highest education level: Not on file  Occupational History  . Not on file  Tobacco Use  . Smoking status: Former Smoker    Types: Cigars    Quit date: 08/27/2011    Years since quitting: 8.9  . Smokeless tobacco: Never Used  Vaping Use  . Vaping Use: Never used  Substance and Sexual Activity  . Alcohol use: No  . Drug use: No  . Sexual activity: Not on file  Other Topics Concern  . Not on file  Social History Narrative  . Not on file   Social Determinants of Health   Financial Resource Strain:   . Difficulty of Paying Living Expenses: Not on file  Food Insecurity:   . Worried About Charity fundraiser in the Last Year: Not on file  . Ran Out of Food in the Last Year: Not on file  Transportation Needs:   . Lack of Transportation (Medical): Not on file  . Lack of Transportation (Non-Medical): Not on file  Physical Activity:   . Days of Exercise per Week: Not on file  . Minutes of Exercise per Session: Not on file  Stress:   . Feeling of Stress : Not on file  Social Connections:   . Frequency of Communication with Friends and Family: Not on file  . Frequency of Social Gatherings with  Friends and Family: Not on file  . Attends Religious Services: Not on file  . Active Member of Clubs or Organizations: Not on file  . Attends Archivist Meetings: Not on file  . Marital Status: Not on file  Intimate Partner Violence:   . Fear of Current or Ex-Partner: Not on file  . Emotionally Abused: Not on file  . Physically Abused: Not on file  . Sexually Abused: Not on file     BP 134/72   Pulse  69   Ht 6\' 2"  (1.88 m)   Wt 221 lb 6.4 oz (100.4 kg)   SpO2 97%   BMI 28.43 kg/m   Physical Exam:  Well appearing 79 year old man, NAD HEENT: Unremarkable Neck:  No JVD, no thyromegally Lymphatics:  No adenopathy Back:  No CVA tenderness Lungs:  Clear, with no wheezes or rhonchi present no increased work of breathing. HEART:  IRegular rate rhythm, no murmurs, no rubs, no clicks Abd:  soft, positive bowel sounds, no organomegally, no rebound, no guarding Ext:  2 plus pulses, no edema, no cyanosis, no clubbing Skin:  No rashes no nodules Neuro:  CN II through XII intact, motor grossly intact  EKG -atrial fibrillation with a controlled ventricular response   Assess/Plan: 1.  Persistent atrial fibrillation -his ventricular rate appears to be well controlled.  He will continue his current low-dose beta-blocker therapy 2.  Dyspnea with exertion -this is his main problem.  Etiology is unclear.  Pulmonary function testing was really not particularly remarkable.  He has preserved left ventricular systolic function.  He had modest coronary disease 3 years ago.  He does not have angina.  I have recommended he undergo cardiopulmonary stress testing.  I would have a low threshold to repeat his heart catheterization.  I have reviewed the patient's cardiac catheterization personally and reviewed the images with the patient. 3.  Systemic anticoagulation -he has had no bleeding on Xarelto 4.  Dyslipidemia -he will continue simvastatin.  No change in dosing at this time.  Carleene Overlie  Edmon Magid,MD

## 2020-08-15 NOTE — Patient Instructions (Addendum)
Medication Instructions:  Your physician has recommended you make the following change in your medication:   1.  START taking Imdur 30 mg --Take one tablet by mouth daily.  Labwork: None ordered.  Testing/Procedures: Your physician has recommended that you have a cardiopulmonary stress test (CPX). CPX testing is a non-invasive measurement of heart and lung function. It replaces a traditional treadmill stress test. This type of test provides a tremendous amount of information that relates not only to your present condition but also for future outcomes. This test combines measurements of you ventilation, respiratory gas exchange in the lungs, electrocardiogram (EKG), blood pressure and physical response before, during, and following an exercise protocol.  Please call me if you want me to order this stress test for you  Follow-Up: Your physician wants you to follow-up in: 6 months with Dr. Lovena Le.   You will receive a reminder letter in the mail two months in advance. If you don't receive a letter, please call our office to schedule the follow-up appointment.  Any Other Special Instructions Will Be Listed Below (If Applicable).  If you need a refill on your cardiac medications before your next appointment, please call your pharmacy.

## 2020-08-29 ENCOUNTER — Telehealth: Payer: Self-pay | Admitting: Internal Medicine

## 2020-08-29 DIAGNOSIS — R0609 Other forms of dyspnea: Secondary | ICD-10-CM

## 2020-08-29 DIAGNOSIS — R0600 Dyspnea, unspecified: Secondary | ICD-10-CM

## 2020-08-29 NOTE — Telephone Encounter (Signed)
Patient states Dr. Lovena Le wanted him to have a stress test and called to schedule it. I did not see any orders for a stress test.

## 2020-08-29 NOTE — Telephone Encounter (Signed)
Pt made aware Sonia Baller, RN is out of the office today and will follow up with him next week about CPX ordering/scheduling. Pt agreeable to plan.

## 2020-09-02 NOTE — Telephone Encounter (Signed)
Returned call to Pt.  Advised stress test had been ordered.  Will mail instruction letter today.  Per Dr. Heide Spark morning of procedure Pt should take his metoprolol.  All questions answered.

## 2020-09-25 ENCOUNTER — Ambulatory Visit (HOSPITAL_COMMUNITY): Payer: Medicare HMO | Attending: Internal Medicine

## 2020-09-25 ENCOUNTER — Other Ambulatory Visit: Payer: Self-pay

## 2020-09-25 DIAGNOSIS — R0609 Other forms of dyspnea: Secondary | ICD-10-CM

## 2020-09-25 DIAGNOSIS — R0600 Dyspnea, unspecified: Secondary | ICD-10-CM | POA: Diagnosis present

## 2020-09-26 ENCOUNTER — Telehealth: Payer: Self-pay | Admitting: Internal Medicine

## 2020-09-26 NOTE — Telephone Encounter (Signed)
Pt c/o medication issue: 1. Name of Medication: Isosorbide  2. How are you currently taking this medication (dosage and times per day)? 1 PRN 3. Are you having a reaction (difficulty breathing--STAT)?  Headaches  4. What is your medication issue? Patient states he had a headache for about 6 days. Patient also would stress test results

## 2020-09-26 NOTE — Telephone Encounter (Signed)
Call returned to Tyrone Mitchell.  He is calling to advise he has stopped taking Imdur because he had headaches and when he looked at this hands his veins were engorged and it frightened him.  Advised Tyrone Mitchell stress test results not available yet.

## 2020-09-29 ENCOUNTER — Other Ambulatory Visit: Payer: Self-pay | Admitting: Internal Medicine

## 2020-10-02 ENCOUNTER — Telehealth: Payer: Self-pay | Admitting: Internal Medicine

## 2020-10-02 NOTE — Telephone Encounter (Signed)
Tyrone Mitchell is calling requesting his Cardiopulmonary exercise test results.

## 2020-10-08 NOTE — Telephone Encounter (Signed)
Outreach made to Pt.  Pt given results of stress test.  All questions answered.  Pt states he is starting to feel better after stopping lisinopril (ordered by PCP, Ozempic (ordered by PCP) and Imdur (ordered by [facility]).

## 2020-10-13 ENCOUNTER — Encounter (HOSPITAL_COMMUNITY): Payer: Medicare HMO

## 2021-02-12 ENCOUNTER — Emergency Department (HOSPITAL_COMMUNITY)
Admission: EM | Admit: 2021-02-12 | Discharge: 2021-02-12 | Disposition: A | Discharge status: Home / Self Care | Payer: Medicare Other | Attending: Emergency Medicine | Admitting: Emergency Medicine

## 2021-02-12 ENCOUNTER — Encounter (HOSPITAL_COMMUNITY): Payer: Self-pay | Admitting: *Deleted

## 2021-02-12 ENCOUNTER — Emergency Department (HOSPITAL_COMMUNITY): Payer: Medicare Other

## 2021-02-12 ENCOUNTER — Other Ambulatory Visit: Payer: Self-pay

## 2021-02-12 DIAGNOSIS — Z7984 Long term (current) use of oral hypoglycemic drugs: Secondary | ICD-10-CM | POA: Diagnosis not present

## 2021-02-12 DIAGNOSIS — Z20822 Contact with and (suspected) exposure to covid-19: Secondary | ICD-10-CM | POA: Insufficient documentation

## 2021-02-12 DIAGNOSIS — Z794 Long term (current) use of insulin: Secondary | ICD-10-CM | POA: Insufficient documentation

## 2021-02-12 DIAGNOSIS — Z8546 Personal history of malignant neoplasm of prostate: Secondary | ICD-10-CM | POA: Diagnosis not present

## 2021-02-12 DIAGNOSIS — R519 Headache, unspecified: Secondary | ICD-10-CM | POA: Diagnosis not present

## 2021-02-12 DIAGNOSIS — Z87891 Personal history of nicotine dependence: Secondary | ICD-10-CM | POA: Diagnosis not present

## 2021-02-12 DIAGNOSIS — R439 Unspecified disturbances of smell and taste: Secondary | ICD-10-CM | POA: Diagnosis not present

## 2021-02-12 DIAGNOSIS — Z96642 Presence of left artificial hip joint: Secondary | ICD-10-CM | POA: Insufficient documentation

## 2021-02-12 DIAGNOSIS — R531 Weakness: Secondary | ICD-10-CM | POA: Diagnosis not present

## 2021-02-12 DIAGNOSIS — E119 Type 2 diabetes mellitus without complications: Secondary | ICD-10-CM | POA: Diagnosis not present

## 2021-02-12 DIAGNOSIS — Z7901 Long term (current) use of anticoagulants: Secondary | ICD-10-CM | POA: Diagnosis not present

## 2021-02-12 DIAGNOSIS — R0981 Nasal congestion: Secondary | ICD-10-CM | POA: Diagnosis not present

## 2021-02-12 LAB — BASIC METABOLIC PANEL
Anion gap: 6 (ref 5–15)
BUN: 17 mg/dL (ref 8–23)
CO2: 26 mmol/L (ref 22–32)
Calcium: 8.8 mg/dL — ABNORMAL LOW (ref 8.9–10.3)
Chloride: 103 mmol/L (ref 98–111)
Creatinine, Ser: 0.98 mg/dL (ref 0.61–1.24)
GFR, Estimated: >60 mL/min (ref >60)
Glucose, Bld: 222 mg/dL — ABNORMAL HIGH (ref 70–99)
Potassium: 3.9 mmol/L (ref 3.5–5.1)
Sodium: 135 mmol/L (ref 135–145)

## 2021-02-12 LAB — CBC WITH DIFFERENTIAL/PLATELET
Abs Immature Granulocytes: 0.04 10*3/uL (ref 0.00–0.07)
Basophils Absolute: 0 10*3/uL (ref 0.0–0.1)
Basophils Relative: 1 %
Eosinophils Absolute: 0.2 10*3/uL (ref 0.0–0.5)
Eosinophils Relative: 3 %
HCT: 40.5 % (ref 39.0–52.0)
Hemoglobin: 13.1 g/dL (ref 13.0–17.0)
Immature Granulocytes: 1 %
Lymphocytes Relative: 17 %
Lymphs Abs: 1.1 10*3/uL (ref 0.7–4.0)
MCH: 30.1 pg (ref 26.0–34.0)
MCHC: 32.3 g/dL (ref 30.0–36.0)
MCV: 93.1 fL (ref 80.0–100.0)
Monocytes Absolute: 0.6 10*3/uL (ref 0.1–1.0)
Monocytes Relative: 10 %
Neutro Abs: 4.5 10*3/uL (ref 1.7–7.7)
Neutrophils Relative %: 68 %
Platelets: 178 10*3/uL (ref 150–400)
RBC: 4.35 MIL/uL (ref 4.22–5.81)
RDW: 14.2 % (ref 11.5–15.5)
WBC: 6.5 10*3/uL (ref 4.0–10.5)
nRBC: 0 % (ref 0.0–0.2)

## 2021-02-12 LAB — RESP PANEL BY RT-PCR (FLU A&B, COVID) ARPGX2
Influenza A by PCR: NEGATIVE
Influenza B by PCR: NEGATIVE
SARS Coronavirus 2 by RT PCR: NEGATIVE

## 2021-02-12 LAB — PROTIME-INR
INR: 1 (ref 0.8–1.2)
Prothrombin Time: 13.4 seconds (ref 11.4–15.2)

## 2021-02-12 MED ORDER — SODIUM CHLORIDE 0.9 % IV BOLUS
500.0000 mL | Freq: Once | INTRAVENOUS | Status: AC
  Administered 2021-02-12: 500 mL via INTRAVENOUS

## 2021-02-12 MED ORDER — NAPROXEN 375 MG PO TABS: 375.0000 mg | ORAL_TABLET | Freq: Two times a day (BID) | ORAL | 0 refills | Status: DC

## 2021-02-12 MED ORDER — KETOROLAC TROMETHAMINE 30 MG/ML IJ SOLN
15.0000 mg | Freq: Once | INTRAMUSCULAR | Status: AC
  Administered 2021-02-12: 15 mg via INTRAVENOUS
  Filled 2021-02-12: qty 1

## 2021-02-12 NOTE — ED Provider Notes (Signed)
[facility] EMERGENCY DEPARTMENT Provider Note  CSN: [id number] Arrival date & time: 02/12/21 1517    History Chief Complaint  Patient presents with  . Headache    HPI  Tyrone Mitchell is a 80 y.o. male with history of afib on Xarelto reports 2 days of moderate aching posterior headache associated with generalized weakness and loss of smell. He has not had a fever. He has had some nasal congestion. He had a brief self-limited nose bleed about 4 days ago but none since. Denies any cough or SOB. No nausea or vomiting. Took a home Covid test which was negative.    Past Medical History:  Diagnosis Date  . AAA (abdominal aortic aneurysm) ([place])    Abd U/S 4/14:  Infrarenal AAA 3.2 x 3.1 cm, bilat CIA mildly dilated with slight increase on L, mild aorto-iliac atherosclerosis w/o stenosis - f/u 1 year  . Anemia   . Arthritis   . Atrial fibrillation ([place])    a. on flecainide and coumadin  . Atrial flutter ([place])    a. s/p TEE/DCCV 09/2012, EF 50-55% by TEE.  . Cancer [facility]) 2010   prostate  . Complication of anesthesia    HAD DIFFICULTY WAKING AS CHILD  . Diabetes mellitus without complication ([place])   . GERD (gastroesophageal reflux disease)   . Hyperlipidemia   . Mild mitral and aortic regurgitation    a. by TEE 09/2012  . Visit for monitoring [name] therapy     Past Surgical History:  Procedure Laterality Date  . BACK SURGERY    . CARDIAC CATHETERIZATION N/A 10/22/2016   Procedure: Left Heart Cath and Coronary Angiography;  Surgeon: Peter M Martinique, MD;  Location: [facility] CV LAB;  Service: Cardiovascular;  Laterality: N/A;  . CARDIOVERSION  10/09/2012   Procedure: CARDIOVERSION;  Surgeon: Lelon Perla, MD;  Location: [facility] ENDOSCOPY;  Service: Cardiovascular;  Laterality: N/A;  . CARDIOVERSION N/A 07/17/2014   Procedure: CARDIOVERSION;  Surgeon: Deboraha Sprang, MD;  Location: [facility] CATH LAB;  Service: Cardiovascular;  Laterality: N/A;  . CHOLECYSTECTOMY    . HERNIA REPAIR     . hip replacement  09/12/2012   left  . HIP SURGERY    . PROSTATECTOMY    . TEE WITHOUT CARDIOVERSION  10/09/2012   Procedure: TRANSESOPHAGEAL ECHOCARDIOGRAM (TEE);  Surgeon: Lelon Perla, MD;  Location: [facility] ENDOSCOPY;  Service: Cardiovascular;  Laterality: N/A;  . TONSILLECTOMY    . TOTAL HIP ARTHROPLASTY  09/12/2012   Procedure: TOTAL HIP ARTHROPLASTY ANTERIOR APPROACH;  Surgeon: Mauri Pole, MD;  Location: [facility] ORS;  Service: Orthopedics;  Laterality: Left;    No family history on file.  Social History   Tobacco Use  . Smoking status: Former Smoker    Types: Cigars    Quit date: 08/27/2011    Years since quitting: 9.4  . Smokeless tobacco: Never Used  Vaping Use  . Vaping Use: Never used  Substance Use Topics  . Alcohol use: No  . Drug use: No     Home Medications Prior to Admission medications   Medication Sig Start Date End Date Taking? Authorizing Provider  acetaminophen (TYLENOL) 500 MG tablet Take 500 mg by mouth 3 (three) times daily.   Yes [provider]  ARTIFICIAL TEAR SOLUTION OP Place 1 drop into both eyes daily.   Yes [provider]  diclofenac Sodium (VOLTAREN) 1 % GEL Apply 4 g topically 4 (four) times daily. 12/24/20  Yes [provider]  insulin degludec (TRESIBA) 100 UNIT/ML FlexTouch Pen Inject 25 Units into the skin every evening. 10/15/20  Yes [provider]  metoprolol tartrate (LOPRESSOR) 25 MG tablet TAKE (1) TABLET TWICE A DAY. Patient taking differently: Take 25 mg by mouth 2 (two) times daily. 09/30/20  Yes Evans Lance, MD  naproxen (NAPROSYN) 375 MG tablet Take 1 tablet (375 mg total) by mouth 2 (two) times daily. 02/12/21  Yes Truddie Hidden, MD  pantoprazole (PROTONIX) 40 MG tablet Take 40 mg by mouth daily.  05/09/18  Yes [provider]  rivaroxaban (XARELTO) 20 MG TABS tablet Take 20 mg by mouth daily. 12/26/13  Yes Evans Lance, MD  simvastatin (ZOCOR) 20 MG tablet Take 20 mg by mouth  daily.  07/21/15  Yes [provider]  traMADol (ULTRAM) 50 MG tablet Take 50 mg by mouth every 6 (six) hours as needed for pain. 08/11/20  Yes [provider]  GLIPIZIDE XL 10 MG 24 hr tablet Take 10 mg by mouth 2 (two) times daily.  Patient not taking: Reported on 02/12/2021 09/06/16   [provider]  levocetirizine (XYZAL) 5 MG tablet Take 1 tablet by mouth every evening. Patient not taking: No sig reported 08/11/20   [provider]  psyllium (METAMUCIL SMOOTH TEXTURE) 28 % packet Take 1 packet by mouth 2 (two) times daily. Patient not taking: Reported on 02/12/2021    [provider]  TRADJENTA 5 MG TABS tablet Take 5 mg by mouth daily. 10/12/16   [provider]     Allergies    Penicillins   Review of Systems   Review of Systems A comprehensive review of systems was completed and negative except as noted in HPI.    Physical Exam BP (!) 155/89   Pulse 87   Temp 98.2 F (36.8 C) (Oral)   Resp 20   SpO2 96%   Physical Exam Vitals and nursing note reviewed.  Constitutional:      Appearance: Normal appearance.  HENT:     Head: Normocephalic and atraumatic.     Nose: Nose normal.     Mouth/Throat:     Mouth: Mucous membranes are dry.  Eyes:     Extraocular Movements: Extraocular movements intact.     Conjunctiva/sclera: Conjunctivae normal.  Cardiovascular:     Rate and Rhythm: Normal rate.  Pulmonary:     Effort: Pulmonary effort is normal.     Breath sounds: Normal breath sounds.  Abdominal:     General: Abdomen is flat.     Palpations: Abdomen is soft.     Tenderness: There is no abdominal tenderness.  Musculoskeletal:        General: No swelling. Normal range of motion.     Cervical back: Neck supple.  Skin:    General: Skin is warm and dry.  Neurological:     General: No focal deficit present.     Mental Status: He is alert.     Cranial Nerves: No cranial nerve deficit.     Sensory: No sensory deficit.      Motor: No weakness.  Psychiatric:        Mood and Affect: Mood normal.      ED Results / Procedures / Treatments   Labs (all labs ordered are listed, but only abnormal results are displayed) Labs Reviewed  BASIC METABOLIC PANEL - Abnormal; Notable for the following components:      Result Value   Glucose, Bld 222 (*)  Calcium 8.8 (*)    All other components within normal limits  RESP PANEL BY RT-PCR (FLU A&B, COVID) ARPGX2  PROTIME-INR  CBC WITH DIFFERENTIAL/PLATELET    EKG EKG Interpretation  Date/Time:  Thursday Feb 12 2021 15:40:25 EDT Ventricular Rate:  107 PR Interval:    QRS Duration: 107 QT Interval:  373 QTC Calculation: 498 R Axis:   -1 Text Interpretation: Atrial fibrillation Ventricular premature complex Borderline prolonged QT interval No significant change since last tracing Confirmed by Calvert Cantor [phone]) on 02/12/2021 3:53:08 PM   Radiology CT Head Wo Contrast  Result Date: 02/12/2021 CLINICAL DATA:  Weakness EXAM: CT HEAD WITHOUT CONTRAST TECHNIQUE: Contiguous axial images were obtained from the base of the skull through the vertex without intravenous contrast. COMPARISON:  CT brain 08/16/2011 FINDINGS: Brain: No acute territorial infarction, hemorrhage, or intracranial mass. Mild atrophy. Mild chronic small vessel ischemic change of the white matter. Nonenlarged ventricles. Vascular: No hyper dense vessels.  No unexpected calcification Skull: Normal. Negative for fracture or focal lesion. Sinuses/Orbits: No acute finding. Other: None IMPRESSION: 1. No CT evidence for acute intracranial abnormality. 2. Mild atrophy and minimal chronic small vessel ischemic change of the white matter Electronically Signed   By: Donavan Foil M.D.   On: 02/12/2021 18:16    Procedures Procedures  Medications Ordered in the ED Medications  sodium chloride 0.9 % bolus 500 mL (0 mLs Intravenous Stopped 02/12/21 1830)  ketorolac (TORADOL) 30 MG/ML injection 15 mg (15 mg  Intravenous Given 02/12/21 1828)     MDM Rules/Calculators/A&P MDM Patient with new headache, he is concerned for Covid infection, he has been vaccinated and has had Covid infection. Will check CT head given his blood thinners to rule out hemorrhage as a cause of headache, he has no focal deficits. Check basic labs and IVF for symptom improvement  ED Course  I have reviewed the triage vital signs and the nursing notes.  Pertinent labs & imaging results that were available during my care of the patient were reviewed by me and considered in my medical decision making (see chart for details).  Clinical Course as of 02/12/21 2327  Thu Feb 12, 2021  1659 CBC is normal.  [CS]  1713 INR normal.  [CS]  1747 BMP with mild hyperglycemia, otherwise neg.  [CS]  1829 Covid/Flu are neg. CT neg for ICH, will give a dose of toradol for headache and reassess.  [CS]  2041 Patient reports pain is improved. His BP had been elevated earlier but is trending down. No signs of end organ damage. Recommend continued hydration, NSAIDs at home and close PCP follow up.  [CS]    Clinical Course User Index [CS] Truddie Hidden, MD    Final Clinical Impression(s) / ED Diagnoses Final diagnoses:  Acute nonintractable headache, unspecified headache type    Rx / DC Orders ED Discharge Orders         Ordered    naproxen (NAPROSYN) 375 MG tablet  2 times daily        02/12/21 2043           Truddie Hidden, MD 02/12/21 2328

## 2021-02-12 NOTE — ED Triage Notes (Signed)
Headache with feeling of weakness x 2 days. Also c/o losing taste. States he did a home Covid test and was negative

## 2021-02-16 ENCOUNTER — Ambulatory Visit: Payer: Medicare HMO | Admitting: Student

## 2021-06-22 ENCOUNTER — Telehealth: Payer: Self-pay | Admitting: Internal Medicine

## 2021-06-22 NOTE — Telephone Encounter (Signed)
I have never seen toprol cause diarrhea. However, if the diarrhea has resolved, then we should stop toprol and start verapamil 180 mg daily which will slow down his bowels further and help control his HR. If diarrhea has not improved then it is not the toprol causing it and he should restart the toprol.

## 2021-06-22 NOTE — Telephone Encounter (Signed)
Pt c/o medication issue:  1. Name of Medication: metoprolol tartrate (LOPRESSOR) 25 MG tablet  2. How are you currently taking this medication (dosage and times per day)? Patient took last dose Friday night   3. Are you having a reaction (difficulty breathing--STAT)? Diarrhea   4. What is your medication issue? Patient said his PCP wanted him to stop the medicine. He is reluctant to stop taking this long term because it is supposed to be helping his heart rhythm.  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

## 2021-06-22 NOTE — Telephone Encounter (Signed)
Spoke with the patient who reports that back in May he was hospitalized and diagnosed with Guillain-Barre syndrome. He reports that ever since he has had diarrhea. He has been following up with his PCP and they have not been able to find a solution. His PCP advised him to stop taking metoprolol to see if it was a cause. Patient has not taken metoprolol since Friday. He states HR and blood pressure have been good except for yesterday his HR got up to 101. He wanted to make Dr. Lovena Le aware that he is not taking the metoprolol and ensure that it was okay. Advised patient to continue monitoring his HR and BP and let us know if either are remaining elevated. Patient verbalized understanding.

## 2021-06-23 NOTE — Telephone Encounter (Signed)
Spoke with the patient and advised him on recommendations from Dr. Hall Busing. He will let us know whether his diarrhea resolves or not so that we can determine if he needs to go back on metoprolol or switch to verapamil.

## 2021-08-18 ENCOUNTER — Other Ambulatory Visit: Payer: Self-pay

## 2021-08-18 ENCOUNTER — Ambulatory Visit (INDEPENDENT_AMBULATORY_CARE_PROVIDER_SITE_OTHER): Payer: Medicare Other | Admitting: Internal Medicine

## 2021-08-18 ENCOUNTER — Ambulatory Visit (INDEPENDENT_AMBULATORY_CARE_PROVIDER_SITE_OTHER): Payer: Medicare Other

## 2021-08-18 ENCOUNTER — Encounter: Payer: Self-pay | Admitting: Internal Medicine

## 2021-08-18 VITALS — BP 140/80 | HR 68 | Ht 74.0 in | Wt 200.2 lb

## 2021-08-18 DIAGNOSIS — I4891 Unspecified atrial fibrillation: Secondary | ICD-10-CM | POA: Diagnosis not present

## 2021-08-18 DIAGNOSIS — G610 Guillain-Barre syndrome: Secondary | ICD-10-CM | POA: Diagnosis not present

## 2021-08-18 NOTE — Progress Notes (Signed)
HPI Mr. Maffeo returns today for followup. He has a long h/o atrial fib initially with a RVR but now presumably controlled VR, with chronic dyspnea for which has undergone an extensive workup which demonstrated fairly normal functional capacity. He subsequently developed GBS. He was in the hospital for 2 months. He remains weak but is slowly improving. He has not had syncope. While he was in the hospital he was noted to have wide swings in his HR. Since DC he has not had syncope. Allergies  Allergen Reactions   Penicillins Anaphylaxis    Swelling of the mouth  Has patient had a PCN reaction causing immediate rash, facial/tongue/throat swelling, SOB or lightheadedness with hypotension: yes Has patient had a PCN reaction causing severe rash involving mucus membranes or skin necrosis: {yes Has patient had a PCN reaction that required hospitalization {no Has patient had a PCN reaction occurring within the last 10 years: {no If all of the above answers are "NO", then may proceed with Cephalosporin use.     Current Outpatient Medications  Medication Sig Dispense Refill   acetaminophen (TYLENOL) 500 MG tablet Take 500 mg by mouth 3 (three) times daily.     ARTIFICIAL TEAR SOLUTION OP Place 1 drop into both eyes daily.     diclofenac Sodium (VOLTAREN) 1 % GEL Apply 4 g topically 4 (four) times daily.     GLIPIZIDE XL 10 MG 24 hr tablet Take 10 mg by mouth 2 (two) times daily.     insulin degludec (TRESIBA) 100 UNIT/ML FlexTouch Pen Inject 25 Units into the skin every evening.     levocetirizine (XYZAL) 5 MG tablet Take 1 tablet by mouth every evening.     metoprolol tartrate (LOPRESSOR) 25 MG tablet TAKE (1) TABLET TWICE A DAY. (Patient taking differently: Take 25 mg by mouth 2 (two) times daily.) 180 tablet 3   naproxen (NAPROSYN) 375 MG tablet Take 1 tablet (375 mg total) by mouth 2 (two) times daily. 20 tablet 0   pantoprazole (PROTONIX) 40 MG tablet Take 40 mg by mouth daily.       psyllium (METAMUCIL SMOOTH TEXTURE) 28 % packet Take 1 packet by mouth 2 (two) times daily.     rivaroxaban (XARELTO) 20 MG TABS tablet Take 20 mg by mouth daily.     simvastatin (ZOCOR) 20 MG tablet Take 20 mg by mouth daily.      TRADJENTA 5 MG TABS tablet Take 5 mg by mouth daily.     traMADol (ULTRAM) 50 MG tablet Take 50 mg by mouth every 6 (six) hours as needed for pain.     No current facility-administered medications for this visit.     Past Medical History:  Diagnosis Date   AAA (abdominal aortic aneurysm)    Abd U/S 4/14:  Infrarenal AAA 3.2 x 3.1 cm, bilat CIA mildly dilated with slight increase on L, mild aorto-iliac atherosclerosis w/o stenosis - f/u 1 year   Anemia    Arthritis    Atrial fibrillation ([place])    a. on flecainide and coumadin   Atrial flutter ([place])    a. s/p TEE/DCCV 09/2012, EF 50-55% by TEE.   Cancer [facility]) 2010   prostate   Complication of anesthesia    HAD DIFFICULTY WAKING AS CHILD   Diabetes mellitus without complication (HCC)    GERD (gastroesophageal reflux disease)    Hyperlipidemia    Mild mitral and aortic regurgitation    a. by TEE 09/2012  Visit for monitoring [name] therapy     ROS:   All systems reviewed and negative except as noted in the HPI.   Past Surgical History:  Procedure Laterality Date   BACK SURGERY     CARDIAC CATHETERIZATION N/A 10/22/2016   Procedure: Left Heart Cath and Coronary Angiography;  Surgeon: Peter M Martinique, MD;  Location: [facility] CV LAB;  Service: Cardiovascular;  Laterality: N/A;   CARDIOVERSION  10/09/2012   Procedure: CARDIOVERSION;  Surgeon: Lelon Perla, MD;  Location: [facility] ENDOSCOPY;  Service: Cardiovascular;  Laterality: N/A;   CARDIOVERSION N/A 07/17/2014   Procedure: CARDIOVERSION;  Surgeon: Deboraha Sprang, MD;  Location: [facility] CATH LAB;  Service: Cardiovascular;  Laterality: N/A;   CHOLECYSTECTOMY     HERNIA REPAIR     hip replacement  09/12/2012   left   HIP SURGERY     PROSTATECTOMY      TEE WITHOUT CARDIOVERSION  10/09/2012   Procedure: TRANSESOPHAGEAL ECHOCARDIOGRAM (TEE);  Surgeon: Lelon Perla, MD;  Location: [facility] ENDOSCOPY;  Service: Cardiovascular;  Laterality: N/A;   TONSILLECTOMY     TOTAL HIP ARTHROPLASTY  09/12/2012   Procedure: TOTAL HIP ARTHROPLASTY ANTERIOR APPROACH;  Surgeon: Mauri Pole, MD;  Location: [facility] ORS;  Service: Orthopedics;  Laterality: Left;     History reviewed. No pertinent family history.   Social History   Socioeconomic History   Marital status: Divorced    Spouse name: Not on file   Number of children: Not on file   Years of education: Not on file   Highest education level: Not on file  Occupational History   Not on file  Tobacco Use   Smoking status: Former    Types: Cigars    Quit date: 08/27/2011    Years since quitting: 9.9   Smokeless tobacco: Never  Vaping Use   Vaping Use: Never used  Substance and Sexual Activity   Alcohol use: No   Drug use: No   Sexual activity: Not on file  Other Topics Concern   Not on file  Social History Narrative   Not on file   Social Determinants of Health   Financial Resource Strain: Not on file  Food Insecurity: Not on file  Transportation Needs: Not on file  Physical Activity: Not on file  Stress: Not on file  Social Connections: Not on file  Intimate Partner Violence: Not on file     BP 140/80   Pulse 68   Ht 6\' 2"  (1.88 m)   Wt 200 lb 3.2 oz (90.8 kg)   SpO2 97%   BMI 25.70 kg/m   Physical Exam:  Well appearing NAD HEENT: Unremarkable Neck:  No JVD, no thyromegally Lymphatics:  No adenopathy Back:  No CVA tenderness Lungs:  Clear with no wheezes HEART:  IRegular rate rhythm, no murmurs, no rubs, no clicks Abd:  soft, positive bowel sounds, no organomegally, no rebound, no guarding Ext:  2 plus pulses, no edema, no cyanosis, no clubbing Skin:  No rashes no nodules Neuro:  CN II through XII intact, motor grossly intact   Assess/Plan:  Atrial fib - unclear  how well his rates are controlled and I have asked him to wear a 7 day Zio monitor. Chronic dyspnea - no better and no worse since the GBS GBS - he is improved but remains weakened by this.  4. HTN - his bp is reasonably controlled. Based on his heart monitor we might adjust his meds further.  Carleene Overlie Cade Olberding,MD

## 2021-08-18 NOTE — Patient Instructions (Signed)
Medication Instructions:  Your physician recommends that you continue on your current medications as directed. Please refer to the Current Medication list given to you today.  *If you need a refill on your cardiac medications before your next appointment, please call your pharmacy*   Lab Work: NONE   If you have labs (blood work) drawn today and your tests are completely normal, you will receive your results only by: [place] (if you have MyChart) OR A paper copy in the mail If you have any lab test that is abnormal or we need to change your treatment, we will call you to review the results.   Testing/Procedures: [place] [place]- Long Term Monitor Instructions   Your physician has requested you wear your ZIO patch monitor___7____days.   This is a single patch monitor.  [employer] supplies one patch monitor per enrollment.  Additional stickers are not available.   Please do not apply patch if you will be having a Nuclear Stress Test, Echocardiogram, Cardiac CT, MRI, or Chest Xray during the time frame you would be wearing the monitor. The patch cannot be worn during these tests.  You cannot remove and re-apply the ZIO XT patch monitor.   Your ZIO patch monitor will be sent USPS Priority mail from [facility] directly to your home address. The monitor may also be mailed to a PO BOX if home delivery is not available.   It may take 3-5 days to receive your monitor after you have been enrolled.   Once you have received you monitor, please review enclosed instructions.  Your monitor has already been registered assigning a specific monitor serial # to you.   Applying the monitor   Shave hair from upper left chest.   Hold abrader disc by orange tab.  Rub abrader in 40 strokes over left upper chest as indicated in your monitor instructions.   Clean area with 4 enclosed alcohol pads .  Use all pads to assure are is cleaned thoroughly.  Let dry.   Apply patch as indicated in monitor  instructions.  Patch will be place under collarbone on left side of chest with arrow pointing upward.   Rub patch adhesive wings for 2 minutes.Remove white label marked "1".  Remove white label marked "2".  Rub patch adhesive wings for 2 additional minutes.   While looking in a mirror, press and release button in center of patch.  A small green light will flash 3-4 times .  This will be your only indicator the monitor has been turned on.     Do not shower for the first 24 hours.  You may shower after the first 24 hours.   Press button if you feel a symptom. You will hear a small click.  Record Date, Time and Symptom in the Patient Log Book.   When you are ready to remove patch, follow instructions on last 2 pages of Patient Log Book.  Stick patch monitor onto last page of Patient Log Book.   Place Patient Log Book in [place] box.  Use locking tab on box and tape box closed securely.  The [employer] and [employer] has [employer] on it.  Please place in mailbox as soon as possible.  Your physician should have your test results approximately 7 days after the monitor has been mailed back to [facility].   Call [place] at [phone] if you have questions regarding your ZIO XT patch monitor.  Call them immediately if you see an orange  light blinking on your monitor.   If your monitor falls off in less than 4 days contact our Monitor department at [phone].  If your monitor becomes loose or falls off after 4 days call [employer] at [phone] for suggestions on securing your monitor.     Follow-Up: At [facility], you and your health needs are our priority.  As part of our continuing mission to provide you with exceptional heart care, we have created designated Provider Care Teams.  These Care Teams include your primary Cardiologist (physician) and Advanced Practice Providers (APPs -  Physician Assistants and Nurse Practitioners) who all work together to provide you with  the care you need, when you need it.  We recommend signing up for the patient portal called "MyChart".  Sign up information is provided on this After Visit Summary.  MyChart is used to connect with patients for Virtual Visits (Telemedicine).  Patients are able to view lab/test results, encounter notes, upcoming appointments, etc.  Non-urgent messages can be sent to your provider as well.   To learn more about what you can do with MyChart, go to [email or web address].    Your next appointment:    Pending Test Results   The format for your next appointment:   In Person  Provider:   Cristopher Peru, MD    Other Instructions Thank you for choosing [facility]!

## 2021-09-18 ENCOUNTER — Telehealth: Payer: Self-pay

## 2021-09-18 MED ORDER — METOPROLOL TARTRATE 25 MG PO TABS: ORAL_TABLET | ORAL | 3 refills | Status: DC

## 2021-09-18 NOTE — Telephone Encounter (Signed)
Left detailed message for Pt.  Advised Dr. Lovena Le read Pt's heart monitor and Pt's heart goes too fast during the day and slow at night.  Advised to increase morning metoprolol to 50 mg and continue to take 25 mg at night.  Sent new prescription to Pt's pharmacy.  Requested call back if any further questions.

## 2021-09-18 NOTE — Telephone Encounter (Signed)
-----   Message from Evans Lance, MD sent at 09/14/2021  5:33 PM EST ----- Sonia Baller, lets discuss when you return.

## 2022-03-23 ENCOUNTER — Telehealth: Payer: Self-pay | Admitting: Internal Medicine

## 2022-03-23 NOTE — Telephone Encounter (Signed)
I spoke with patient. He does not weight self at home.He says he noted at different doctor office appointments he has gained some wait here and there. His most recent weight is 204 lbs. We had him at 200 lbs at last office visit in 08/2021. I explained that an 8 lbs weight gain in the last 3 weeks is approx 2.6 lbs gain per week.We are concerned when there is a 3 lbs weight gain over night or a 5 lbs gain in 1 week.He denies any SOB. He says in the am his legs feel good but by later in the day, he feels fatigued and notes some swelling in his ankles.  We talked about avoiding foods high in sodium, he sometimes has hot dogs at lunch. We talked about 2 Gram sodium limit per day.I also advised him to purchase some compression knee highs and wear during the day. He will also elevate legs while sitting. He has a home recliner.    He will update [place] after making these changes.

## 2022-03-23 NOTE — Telephone Encounter (Signed)
Pt c/o swelling: STAT is pt has developed SOB within 24 hours  How much weight have you gained and in what time span? 8 lbs in 3 weeks  If swelling, where is the swelling located? Ankles, calves and feet.  Are you currently taking a fluid pill? No  Are you currently SOB? No  Do you have a log of your daily weights (if so, list)?   Have you gained 3 pounds in a day or 5 pounds in a week? He says 8 lbs in the past 3 weeks  Have you traveled recently? No    Pt says that he is experiencing swelling when he gets up and moves around. He says it seems to go down when he has his feet up, but goes right back to being swollen once his feet are down on ground and moving around.

## 2022-03-27 ENCOUNTER — Emergency Department (HOSPITAL_COMMUNITY)
Admission: EM | Admit: 2022-03-27 | Discharge: 2022-03-27 | Disposition: A | Discharge status: Home / Self Care | Payer: Medicare HMO | Attending: Emergency Medicine | Admitting: Emergency Medicine

## 2022-03-27 ENCOUNTER — Other Ambulatory Visit: Payer: Self-pay

## 2022-03-27 ENCOUNTER — Encounter (HOSPITAL_COMMUNITY): Payer: Self-pay | Admitting: Emergency Medicine

## 2022-03-27 ENCOUNTER — Emergency Department (HOSPITAL_COMMUNITY): Payer: Medicare HMO

## 2022-03-27 DIAGNOSIS — E876 Hypokalemia: Secondary | ICD-10-CM | POA: Insufficient documentation

## 2022-03-27 DIAGNOSIS — R2243 Localized swelling, mass and lump, lower limb, bilateral: Secondary | ICD-10-CM | POA: Insufficient documentation

## 2022-03-27 DIAGNOSIS — Z8546 Personal history of malignant neoplasm of prostate: Secondary | ICD-10-CM | POA: Diagnosis not present

## 2022-03-27 DIAGNOSIS — R600 Localized edema: Secondary | ICD-10-CM | POA: Insufficient documentation

## 2022-03-27 DIAGNOSIS — I4891 Unspecified atrial fibrillation: Secondary | ICD-10-CM | POA: Diagnosis not present

## 2022-03-27 DIAGNOSIS — Z79899 Other long term (current) drug therapy: Secondary | ICD-10-CM | POA: Insufficient documentation

## 2022-03-27 DIAGNOSIS — E119 Type 2 diabetes mellitus without complications: Secondary | ICD-10-CM | POA: Insufficient documentation

## 2022-03-27 DIAGNOSIS — Z7901 Long term (current) use of anticoagulants: Secondary | ICD-10-CM | POA: Insufficient documentation

## 2022-03-27 DIAGNOSIS — Z7984 Long term (current) use of oral hypoglycemic drugs: Secondary | ICD-10-CM | POA: Diagnosis not present

## 2022-03-27 DIAGNOSIS — R609 Edema, unspecified: Secondary | ICD-10-CM

## 2022-03-27 DIAGNOSIS — Z794 Long term (current) use of insulin: Secondary | ICD-10-CM | POA: Diagnosis not present

## 2022-03-27 LAB — COMPREHENSIVE METABOLIC PANEL
ALT: 14 U/L (ref 0–44)
AST: 16 U/L (ref 15–41)
Albumin: 3.7 g/dL (ref 3.5–5.0)
Alkaline Phosphatase: 69 U/L (ref 38–126)
Anion gap: 7 (ref 5–15)
BUN: 16 mg/dL (ref 8–23)
CO2: 25 mmol/L (ref 22–32)
Calcium: 8.8 mg/dL — ABNORMAL LOW (ref 8.9–10.3)
Chloride: 103 mmol/L (ref 98–111)
Creatinine, Ser: 0.96 mg/dL (ref 0.61–1.24)
GFR, Estimated: >60 mL/min (ref >60)
Glucose, Bld: 207 mg/dL — ABNORMAL HIGH (ref 70–99)
Potassium: 3.4 mmol/L — ABNORMAL LOW (ref 3.5–5.1)
Sodium: 135 mmol/L (ref 135–145)
Total Bilirubin: 0.7 mg/dL (ref 0.3–1.2)
Total Protein: 6.7 g/dL (ref 6.5–8.1)

## 2022-03-27 LAB — CBC
HCT: 35.5 % — ABNORMAL LOW (ref 39.0–52.0)
Hemoglobin: 11.5 g/dL — ABNORMAL LOW (ref 13.0–17.0)
MCH: 30.3 pg (ref 26.0–34.0)
MCHC: 32.4 g/dL (ref 30.0–36.0)
MCV: 93.7 fL (ref 80.0–100.0)
Platelets: 174 10*3/uL (ref 150–400)
RBC: 3.79 MIL/uL — ABNORMAL LOW (ref 4.22–5.81)
RDW: 14.5 % (ref 11.5–15.5)
WBC: 5.6 10*3/uL (ref 4.0–10.5)
nRBC: 0 % (ref 0.0–0.2)

## 2022-03-27 LAB — BRAIN NATRIURETIC PEPTIDE: B Natriuretic Peptide: 119 pg/mL — ABNORMAL HIGH (ref 0.0–100.0)

## 2022-03-27 MED ORDER — POTASSIUM CHLORIDE CRYS ER 20 MEQ PO TBCR: 20.0000 meq | EXTENDED_RELEASE_TABLET | Freq: Every day | ORAL | 0 refills | Status: DC

## 2022-03-27 MED ORDER — IOHEXOL 350 MG/ML SOLN
75.0000 mL | Freq: Once | INTRAVENOUS | Status: AC | PRN
  Administered 2022-03-27: 75 mL via INTRAVENOUS

## 2022-03-27 NOTE — ED Provider Notes (Signed)
[facility] EMERGENCY DEPARTMENT Provider Note   CSN: [id number] Arrival date & time: 03/27/22  1962     History  No chief complaint on file.   Tyrone Mitchell is a 81 y.o. male.  HPI Patient was sent in for CT angiography.  Reportedly has had swelling in his legs for last few weeks.  Been seen by PCP.  With reviewing notes from PCP it appears as if he was started on Lasix yesterday.  D-dimer was done however and was elevated.  Cannot see other blood work that had been done.  Does have a history of atrial fibrillation and is on blood thinners.  Is not had DVTs in the past.  Has swelling on both his legs.  Did have a break of his right leg a couple years ago.  States he does not really feel short of breath.  He is on anticoagulation for and Xarelto for atrial fibrillation.   Past Medical History:  Diagnosis Date   AAA (abdominal aortic aneurysm) ([place])    Abd U/S 4/14:  Infrarenal AAA 3.2 x 3.1 cm, bilat CIA mildly dilated with slight increase on L, mild aorto-iliac atherosclerosis w/o stenosis - f/u 1 year   Anemia    Arthritis    Atrial fibrillation ([place])    a. on flecainide and coumadin   Atrial flutter ([place])    a. s/p TEE/DCCV 09/2012, EF 50-55% by TEE.   Cancer ([place]) 2010   prostate   Complication of anesthesia    HAD DIFFICULTY WAKING AS CHILD   Diabetes mellitus without complication (HCC)    GERD (gastroesophageal reflux disease)    Hyperlipidemia    Mild mitral and aortic regurgitation    a. by TEE 09/2012   Visit for monitoring [name] therapy     Home Medications Prior to Admission medications   Medication Sig Start Date End Date Taking? Authorizing Provider  potassium chloride SA (KLOR-CON M) 20 MEQ tablet Take 1 tablet (20 mEq total) by mouth daily. 03/27/22  Yes Davonna Belling, MD  acetaminophen (TYLENOL) 500 MG tablet Take 500 mg by mouth 3 (three) times daily.    [provider]  ARTIFICIAL TEAR SOLUTION OP Place 1 drop into both eyes daily.     [provider]  diclofenac Sodium (VOLTAREN) 1 % GEL Apply 4 g topically 4 (four) times daily. 12/24/20   [provider]  GLIPIZIDE XL 10 MG 24 hr tablet Take 10 mg by mouth 2 (two) times daily. 09/06/16   [provider]  insulin degludec (TRESIBA) 100 UNIT/ML FlexTouch Pen Inject 25 Units into the skin every evening. 10/15/20   [provider]  levocetirizine (XYZAL) 5 MG tablet Take 1 tablet by mouth every evening. 08/11/20   [provider]  metoprolol tartrate (LOPRESSOR) 25 MG tablet Take 2 tablets by mouth in the AM and 1 tablet by mouth in the PM 09/18/21   Evans Lance, MD  naproxen (NAPROSYN) 375 MG tablet Take 1 tablet (375 mg total) by mouth 2 (two) times daily. 02/12/21   Truddie Hidden, MD  pantoprazole (PROTONIX) 40 MG tablet Take 40 mg by mouth daily.  05/09/18   [provider]  psyllium (METAMUCIL SMOOTH TEXTURE) 28 % packet Take 1 packet by mouth 2 (two) times daily.    [provider]  rivaroxaban (XARELTO) 20 MG TABS tablet Take 20 mg by mouth daily. 12/26/13   Evans Lance, MD  simvastatin (ZOCOR) 20 MG tablet Take 20 mg  by mouth daily.  07/21/15   [provider]  TRADJENTA 5 MG TABS tablet Take 5 mg by mouth daily. 10/12/16   [provider]  traMADol (ULTRAM) 50 MG tablet Take 50 mg by mouth every 6 (six) hours as needed for pain. 08/11/20   [provider]      Allergies    Penicillins    Review of Systems   Review of Systems  Physical Exam Updated Vital Signs BP (!) 142/77   Pulse 61   Temp 97.6 F (36.4 C) (Oral)   Resp 16   Ht '6\' 2"'$  (1.88 m)   Wt 90.8 kg   SpO2 99%   BMI 25.70 kg/m  Physical Exam Vitals and nursing note reviewed.  HENT:     Head: Normocephalic.  Pulmonary:     Breath sounds: No wheezing or rhonchi.  Chest:     Chest wall: No tenderness.  Abdominal:     Tenderness: There is no abdominal tenderness.  Musculoskeletal:     Right lower leg:  Edema present.     Left lower leg: Edema present.     Comments: Moderate pitting edema bilateral lower extremities.  Skin:    General: Skin is warm.     Capillary Refill: Capillary refill takes less than 2 seconds.  Neurological:     Mental Status: He is alert.     ED Results / Procedures / Treatments   Labs (all labs ordered are listed, but only abnormal results are displayed) Labs Reviewed  COMPREHENSIVE METABOLIC PANEL - Abnormal; Notable for the following components:      Result Value   Potassium 3.4 (*)    Glucose, Bld 207 (*)    Calcium 8.8 (*)    All other components within normal limits  CBC - Abnormal; Notable for the following components:   RBC 3.79 (*)    Hemoglobin 11.5 (*)    HCT 35.5 (*)    All other components within normal limits  BRAIN NATRIURETIC PEPTIDE - Abnormal; Notable for the following components:   B Natriuretic Peptide 119.0 (*)    All other components within normal limits    EKG None  Radiology CT Angio Chest PE W and/or Wo Contrast  Result Date: 03/27/2022 CLINICAL DATA:  Positive D-dimer and right lower leg pain. Suspect pulmonary embolism. EXAM: CT ANGIOGRAPHY CHEST WITH CONTRAST TECHNIQUE: Multidetector CT imaging of the chest was performed using the standard protocol during bolus administration of intravenous contrast. Multiplanar CT image reconstructions and MIPs were obtained to evaluate the vascular anatomy. RADIATION DOSE REDUCTION: This exam was performed according to the departmental dose-optimization program which includes automated exposure control, adjustment of the mA and/or kV according to patient size and/or use of iterative reconstruction technique. CONTRAST:  16m OMNIPAQUE IOHEXOL 350 MG/ML SOLN COMPARISON:  10/08/2012 FINDINGS: Cardiovascular: Mild cardiomegaly. Calcified plaque over the left main and 3 vessel coronary arteries. Calcified plaque over the thoracic aorta. Mild thoracic aortic aneurysm as the ascending thoracic aorta  measures 4 cm in AP diameter unchanged. Pulmonary arterial system is well opacified and demonstrates no evidence of emboli. Mediastinum/Nodes: No significant mediastinal or hilar adenopathy. Remaining mediastinal structures are unremarkable. Lungs/Pleura: Lungs are adequately inflated. No focal airspace consolidation or effusion. Airways are normal. Upper Abdomen: Small cyst over the upper pole left kidney. Calcified plaque over the abdominal aorta. Previous cholecystectomy. Mild aneurysm of the infrarenal abdominal aorta on the most inferior image measuring 3.7 cm in AP diameter. No acute findings. Musculoskeletal: Degenerative  change of the spine. No focal abnormality. Review of the MIP images confirms the above findings. IMPRESSION: 1. No acute cardiopulmonary disease and no evidence of pulmonary embolism. 2. 3.7 cm infrarenal abdominal aortic aneurysm on the most inferior image as this is incompletely evaluated. Recommend complete evaluation with abdominal CT with contrast for further evaluation on an elective basis. 3. 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 Recommend annual imaging followup by CTA or MRA. This recommendation follows 2010 ACCF/AHA/AATS/ACR/ASA/SCA/SCAI/SIR/STS/SVM Guidelines for the Diagnosis and Management of Patients with Thoracic Aortic Disease. Circulation. 2010; 121: Y503-T465. Aortic aneurysm NOS (ICD10-I71.9). 4. Aortic atherosclerosis. Atherosclerotic coronary artery disease. 5. Mild cardiomegaly. 6. Small left renal cyst. Aortic Atherosclerosis (ICD10-I70.0). Electronically Signed   By: Marin Olp M.D.   On: 03/27/2022 12:06    Procedures Procedures    Medications Ordered in ED Medications  iohexol (OMNIPAQUE) 350 MG/ML injection 75 mL (75 mLs Intravenous Contrast Given 03/27/22 1140)    ED Course/ Medical Decision Making/ A&P                           Medical Decision Making Amount and/or Complexity of Data Reviewed Labs: ordered. Radiology:  ordered.  Risk Prescription drug management.   Patient with leg swelling.  Bilateral.  Has been seen by PCP.  Reportedly had some blood work but all I can see the results of is D-dimer which was positive.  Potassium mildly low here.  Creatinine reassuring.  BNP slightly elevated.  Has been started on Lasix by PCP.  With potassium a little low we will add some potassium supplementation.  CT angiography done and independently interpreted and shows no pulm embolism.  Will discharge home with outpatient follow-up and continue his diuretic.  Doubt DVTs bilaterally and is already on anticoagulation        Final Clinical Impression(s) / ED Diagnoses Final diagnoses:  Peripheral edema  Hypokalemia    Rx / DC Orders ED Discharge Orders          Ordered    potassium chloride SA (KLOR-CON M) 20 MEQ tablet  Daily        03/27/22 1237              Davonna Belling, MD 03/27/22 1239

## 2022-03-27 NOTE — ED Triage Notes (Signed)
Pt sent to the ED with an elevated D-Dimer and right lower leg pain suspicious for blood clot.

## 2022-05-19 ENCOUNTER — Ambulatory Visit: Payer: Medicare HMO

## 2022-10-27 ENCOUNTER — Other Ambulatory Visit: Payer: Self-pay | Admitting: Internal Medicine

## 2022-11-20 ENCOUNTER — Other Ambulatory Visit: Payer: Self-pay | Admitting: Internal Medicine

## 2022-11-30 ENCOUNTER — Telehealth: Payer: Self-pay | Admitting: Internal Medicine

## 2022-11-30 MED ORDER — METOPROLOL TARTRATE 25 MG PO TABS: ORAL_TABLET | ORAL | 1 refills | Status: DC

## 2022-11-30 NOTE — Telephone Encounter (Signed)
Refill complete 

## 2022-11-30 NOTE — Telephone Encounter (Signed)
*  STAT* If patient is at the pharmacy, call can be transferred to refill team.   1. Which medications need to be refilled? (please list name of each medication and dose if known)  metoprolol tartrate (LOPRESSOR) 25 MG tablet   2. Which pharmacy/location (including street and city if local pharmacy) is medication to be sent to?  [place], [place]    3. Do they need a 30 day or 90 day supply? 90   Patient has appt on 01/07/23

## 2023-01-07 ENCOUNTER — Ambulatory Visit: Payer: Medicare Other | Attending: Internal Medicine | Admitting: Internal Medicine

## 2023-01-07 ENCOUNTER — Encounter: Payer: Self-pay | Admitting: Internal Medicine

## 2023-01-07 VITALS — BP 122/68 | HR 68 | Ht 74.0 in | Wt 198.6 lb

## 2023-01-07 DIAGNOSIS — I4891 Unspecified atrial fibrillation: Secondary | ICD-10-CM | POA: Diagnosis not present

## 2023-01-07 DIAGNOSIS — I10 Essential (primary) hypertension: Secondary | ICD-10-CM

## 2023-01-07 NOTE — Progress Notes (Signed)
HPI [name] returns today for followup. He has a long h/o atrial fib initially with a RVR but now presumably controlled VR, with chronic dyspnea for which has undergone an extensive workup which demonstrated fairly normal functional capacity. He subsequently developed GBS. He was in the hospital for 2 months. He remains weak but is slowly improving. He has not had syncope. He has class 2 dyspnea. Allergies  Allergen Reactions   Penicillins Anaphylaxis    Swelling of the mouth  Has patient had a PCN reaction causing immediate rash, facial/tongue/throat swelling, SOB or lightheadedness with hypotension: yes Has patient had a PCN reaction causing severe rash involving mucus membranes or skin necrosis: {yes Has patient had a PCN reaction that required hospitalization {no Has patient had a PCN reaction occurring within the last 10 years: {no If all of the above answers are "NO", then may proceed with Cephalosporin use.     Current Outpatient Medications  Medication Sig Dispense Refill   acetaminophen (TYLENOL) 500 MG tablet Take 500 mg by mouth 3 (three) times daily.     ARTIFICIAL TEAR SOLUTION OP Place 1 drop into both eyes daily.     diltiazem (CARDIZEM CD) 180 MG 24 hr capsule Take 180 mg by mouth daily.     GLIPIZIDE XL 10 MG 24 hr tablet Take 10 mg by mouth 2 (two) times daily.     insulin degludec (TRESIBA) 100 UNIT/ML FlexTouch Pen Inject 25 Units into the skin every evening.     levocetirizine (XYZAL) 5 MG tablet Take 1 tablet by mouth every evening.     metoprolol tartrate (LOPRESSOR) 25 MG tablet TAKE TWO TABLETS EVERY MORNING AND 1 TABLET IN THE EVENING (MD ONLY APPROVED 20 DAY SUPPLY UNTIL MAKE APPOINTMENT) 60 tablet 1   pantoprazole (PROTONIX) 40 MG tablet Take 40 mg by mouth daily.      psyllium (METAMUCIL SMOOTH TEXTURE) 28 % packet Take 1 packet by mouth 2 (two) times daily.     rivaroxaban (XARELTO) 20 MG TABS tablet Take 20 mg by mouth daily.     simvastatin  (ZOCOR) 20 MG tablet Take 20 mg by mouth daily.      TRADJENTA 5 MG TABS tablet Take 5 mg by mouth daily.     traMADol (ULTRAM) 50 MG tablet Take 50 mg by mouth every 6 (six) hours as needed for pain.     No current facility-administered medications for this visit.     Past Medical History:  Diagnosis Date   AAA (abdominal aortic aneurysm) ([place])    Abd U/S 4/14:  Infrarenal AAA 3.2 x 3.1 cm, bilat CIA mildly dilated with slight increase on L, mild aorto-iliac atherosclerosis w/o stenosis - f/u 1 year   Anemia    Arthritis    Atrial fibrillation ([place])    a. on flecainide and coumadin   Atrial flutter ([place])    a. s/p TEE/DCCV 09/2012, EF 50-55% by TEE.   Cancer ([place]) 2010   prostate   Complication of anesthesia    HAD DIFFICULTY WAKING AS CHILD   Diabetes mellitus without complication ([facility])    GERD (gastroesophageal reflux disease)    Hyperlipidemia    Mild mitral and aortic regurgitation    a. by TEE 09/2012   Visit for monitoring [name] therapy     ROS:   All systems reviewed and negative except as noted in the HPI.   Past Surgical History:  Procedure Laterality Date   BACK SURGERY  CARDIAC CATHETERIZATION N/A 10/22/2016   Procedure: Left Heart Cath and Coronary Angiography;  Surgeon: Tyrone M Martinique, MD;  Location: [facility];  Service: Cardiovascular;  Laterality: N/A;   CARDIOVERSION  10/09/2012   Procedure: CARDIOVERSION;  Surgeon: Tyrone Perla, MD;  Location: [facility] ENDOSCOPY;  Service: Cardiovascular;  Laterality: N/A;   CARDIOVERSION N/A 07/17/2014   Procedure: CARDIOVERSION;  Surgeon: Tyrone Sprang, MD;  Location: [facility] CATH LAB;  Service: Cardiovascular;  Laterality: N/A;   CHOLECYSTECTOMY     HERNIA REPAIR     hip replacement  09/12/2012   left   HIP SURGERY     PROSTATECTOMY     TEE WITHOUT CARDIOVERSION  10/09/2012   Procedure: TRANSESOPHAGEAL ECHOCARDIOGRAM (TEE);  Surgeon: Tyrone Perla, MD;  Location: [facility] ENDOSCOPY;  Service: Cardiovascular;   Laterality: N/A;   TONSILLECTOMY     TOTAL HIP ARTHROPLASTY  09/12/2012   Procedure: TOTAL HIP ARTHROPLASTY ANTERIOR APPROACH;  Surgeon: Tyrone Pole, MD;  Location: [facility] ORS;  Service: Orthopedics;  Laterality: Left;     History reviewed. No pertinent family history.   Social History   Socioeconomic History   Marital status: Divorced    Spouse name: Not on file   Number of children: Not on file   Years of education: Not on file   Highest education level: Not on file  Occupational History   Not on file  Tobacco Use   Smoking status: Former    Types: Cigars    Quit date: 08/27/2011    Years since quitting: 11.3   Smokeless tobacco: Never  Vaping Use   Vaping Use: Never used  Substance and Sexual Activity   Alcohol use: No   Drug use: No   Sexual activity: Not on file  Other Topics Concern   Not on file  Social History Narrative   Not on file   Social Determinants of Health   Financial Resource Strain: Not on file  Food Insecurity: Not on file  Transportation Needs: Not on file  Physical Activity: Not on file  Stress: Not on file  Social Connections: Not on file  Intimate Partner Violence: Not on file     BP 122/68   Pulse 68   Ht 6\' 2"  (1.88 m)   Wt 198 lb 9.6 oz (90.1 kg)   SpO2 96%   BMI 25.50 kg/m   Physical Exam:  Well appearing NAD HEENT: Unremarkable Neck:  No JVD, no thyromegally Lymphatics:  No adenopathy Back:  No CVA tenderness Lungs:  Clear HEART:  IRegular rate rhythm, no murmurs, no rubs, no clicks Abd:  soft, positive bowel sounds, no organomegally, no rebound, no guarding Ext:  2 plus pulses, no edema, no cyanosis, no clubbing Skin:  No rashes no nodules Neuro:  CN II through XII intact, motor grossly intact  EKG - atrial fib with a controlled VR  Assess/Plan:  Atrial fib - his rates were relatively well controlled on his zio monitor over a year ago. Chronic dyspnea - no better and no worse since the GBS GBS - he is improved  and his strength is gradually getting back to his basline. 4. HTN - his bp is currently well controlled. No change in his meds.  Tyrone Overlie Moraima Burd,MD

## 2023-01-07 NOTE — Patient Instructions (Addendum)
Medication Instructions:  Your physician recommends that you continue on your current medications as directed. Please refer to the Current Medication list given to you today.  *If you need a refill on your cardiac medications before your next appointment, please call your pharmacy*  Lab Work: None ordered.  If you have labs (blood work) drawn today and your tests are completely normal, you will receive your results only by: MyChart Message (if you have MyChart) OR A paper copy in the mail If you have any lab test that is abnormal or we need to change your treatment, we will call you to review the results.  Testing/Procedures: None ordered.  Follow-Up: At [facility], you and your health needs are our priority.  As part of our continuing mission to provide you with exceptional heart care, we have created designated Provider Care Teams.  These Care Teams include your primary Cardiologist (physician) and Advanced Practice Providers (APPs -  Physician Assistants and Nurse Practitioners) who all work together to provide you with the care you need, when you need it.  We recommend signing up for the patient portal called "MyChart".  Sign up information is provided on this After Visit Summary.  MyChart is used to connect with patients for Virtual Visits (Telemedicine).  Patients are able to view lab/test results, encounter notes, upcoming appointments, etc.  Non-urgent messages can be sent to your provider as well.   To learn more about what you can do with MyChart, go to [email or web address].    Your next appointment:   AS NEEDED  The format for your next appointment:   In Person  Provider:   Gregg Taylor, MD{or one of the following Advanced Practice Providers on your designated Care Team:   Renee Ursuy, PA-C Michael "Andy" Tillery, PA-C          

## 2023-01-29 ENCOUNTER — Other Ambulatory Visit: Payer: Self-pay | Admitting: Internal Medicine

## 2023-02-01 ENCOUNTER — Other Ambulatory Visit: Payer: Self-pay

## 2023-02-01 MED ORDER — METOPROLOL TARTRATE 25 MG PO TABS: ORAL_TABLET | ORAL | 3 refills | Status: DC

## 2023-10-21 ENCOUNTER — Telehealth: Payer: Self-pay

## 2023-10-21 NOTE — Telephone Encounter (Signed)
   Pre-operative Risk Assessment    Patient Name: OLLIE ESTY  DOB: July 22, 1941 MRN: [id number]   Date of last office visit: 01/07/23 Date of next office visit: Not scheduled   Request for Surgical Clearance    Procedure:   Bilateral, L4-5 TFESI  Date of Surgery:  Clearance 12/06/23                                Surgeon:  Alm Lower, MD Surgeon's Group or Practice Name:  [facility] Phone number:  [phone] Fax number:  [phone]   Type of Clearance Requested:   - Medical  - Pharmacy:  Hold Rivaroxaban  (Xarelto ) x3 days   Type of Anesthesia:  Not Indicated   Additional requests/questions:    Bonney Ival LOISE Gerome   10/21/2023, 2:06 PM

## 2023-10-24 NOTE — Telephone Encounter (Signed)
 Pharmacy please advise on holding Xarelto prior to TFESI scheduled for 12/06/2023. Thank you.

## 2023-10-24 NOTE — Telephone Encounter (Signed)
   Patient Name: Tyrone Mitchell  DOB: Feb 18, 1941 MRN: [id number]  Primary Cardiologist: Victory LELON Claudene DOUGLAS, MD (Inactive)  Clinical pharmacists have reviewed the patient's past medical history, labs, and current medications as part of preoperative protocol coverage. The following recommendations have been made:  Per office protocol, patient can hold Xarelto  for 3 days prior to procedure.    I will route this recommendation to the requesting party via Epic fax function and remove from pre-op pool.  Please call with questions.  Wyn Raddle, Jackee Victory, NP 10/24/2023, 11:43 AM

## 2023-10-24 NOTE — Telephone Encounter (Signed)
 Patient with diagnosis of afib on Xarelto  for anticoagulation.    Procedure: Bilateral, L4-5 TFESI  Date of procedure: 12/06/23   CHA2DS2-VASc Score = 5   This indicates a 7.2% annual risk of stroke. The patient's score is based upon: CHF History: 0 HTN History: 1 Diabetes History: 1 Stroke History: 0 Vascular Disease History: 1 Age Score: 2 Gender Score: 0      CrCl 73 ml/min Platelet count 202  Per office protocol, patient can hold Xarelto  for 3 days prior to procedure.    **This guidance is not considered finalized until pre-operative APP has relayed final recommendations.**

## 2024-03-11 ENCOUNTER — Encounter (HOSPITAL_BASED_OUTPATIENT_CLINIC_OR_DEPARTMENT_OTHER): Payer: Self-pay | Admitting: Emergency Medicine

## 2024-03-11 ENCOUNTER — Other Ambulatory Visit: Payer: Self-pay

## 2024-03-11 ENCOUNTER — Emergency Department (HOSPITAL_BASED_OUTPATIENT_CLINIC_OR_DEPARTMENT_OTHER): Admitting: Radiology

## 2024-03-11 ENCOUNTER — Emergency Department (HOSPITAL_BASED_OUTPATIENT_CLINIC_OR_DEPARTMENT_OTHER)
Admission: EM | Admit: 2024-03-11 | Discharge: 2024-03-11 | Disposition: A | Discharge status: Home / Self Care | Attending: Emergency Medicine | Admitting: Emergency Medicine

## 2024-03-11 DIAGNOSIS — E119 Type 2 diabetes mellitus without complications: Secondary | ICD-10-CM | POA: Insufficient documentation

## 2024-03-11 DIAGNOSIS — Z7901 Long term (current) use of anticoagulants: Secondary | ICD-10-CM | POA: Diagnosis not present

## 2024-03-11 DIAGNOSIS — R059 Cough, unspecified: Secondary | ICD-10-CM | POA: Diagnosis present

## 2024-03-11 DIAGNOSIS — R051 Acute cough: Secondary | ICD-10-CM | POA: Diagnosis not present

## 2024-03-11 DIAGNOSIS — Z794 Long term (current) use of insulin: Secondary | ICD-10-CM | POA: Insufficient documentation

## 2024-03-11 DIAGNOSIS — I4891 Unspecified atrial fibrillation: Secondary | ICD-10-CM | POA: Insufficient documentation

## 2024-03-11 HISTORY — DX: Guillain-Barre syndrome: G61.0

## 2024-03-11 LAB — CBC WITH DIFFERENTIAL/PLATELET
Abs Immature Granulocytes: 0.03 10*3/uL (ref 0.00–0.07)
Basophils Absolute: 0 10*3/uL (ref 0.0–0.1)
Basophils Relative: 1 %
Eosinophils Absolute: 0.3 10*3/uL (ref 0.0–0.5)
Eosinophils Relative: 7 %
HCT: 40.1 % (ref 39.0–52.0)
Hemoglobin: 13.1 g/dL (ref 13.0–17.0)
Immature Granulocytes: 1 %
Lymphocytes Relative: 18 %
Lymphs Abs: 0.8 10*3/uL (ref 0.7–4.0)
MCH: 29.9 pg (ref 26.0–34.0)
MCHC: 32.7 g/dL (ref 30.0–36.0)
MCV: 91.6 fL (ref 80.0–100.0)
Monocytes Absolute: 0.6 10*3/uL (ref 0.1–1.0)
Monocytes Relative: 14 %
Neutro Abs: 2.6 10*3/uL (ref 1.7–7.7)
Neutrophils Relative %: 59 %
Platelets: 118 10*3/uL — ABNORMAL LOW (ref 150–400)
RBC: 4.38 MIL/uL (ref 4.22–5.81)
RDW: 19 % — ABNORMAL HIGH (ref 11.5–15.5)
WBC: 4.3 10*3/uL (ref 4.0–10.5)
nRBC: 0 % (ref 0.0–0.2)

## 2024-03-11 LAB — BASIC METABOLIC PANEL WITH GFR
Anion gap: 12 (ref 5–15)
BUN: 19 mg/dL (ref 8–23)
CO2: 23 mmol/L (ref 22–32)
Calcium: 9.3 mg/dL (ref 8.9–10.3)
Chloride: 103 mmol/L (ref 98–111)
Creatinine, Ser: 1 mg/dL (ref 0.61–1.24)
GFR, Estimated: >60 mL/min (ref >60)
Glucose, Bld: 197 mg/dL — ABNORMAL HIGH (ref 70–99)
Potassium: 4.1 mmol/L (ref 3.5–5.1)
Sodium: 138 mmol/L (ref 135–145)

## 2024-03-11 LAB — RESP PANEL BY RT-PCR (RSV, FLU A&B, COVID)  RVPGX2
Influenza A by PCR: NEGATIVE
Influenza B by PCR: NEGATIVE
Resp Syncytial Virus by PCR: NEGATIVE
SARS Coronavirus 2 by RT PCR: NEGATIVE

## 2024-03-11 MED ORDER — BENZONATATE 100 MG PO CAPS: 100.0000 mg | ORAL_CAPSULE | Freq: Three times a day (TID) | ORAL | 0 refills | Status: AC

## 2024-03-11 NOTE — Discharge Instructions (Signed)
 As we discussed, chest x-ray was negative for pneumonia.  All of your blood work looks great.  This is likely a viral illness that will resolve with time.  I would like for you to take Tessalon Perles as needed for the cough.  You can follow-up with your primary care doctor.  You may do the emergency room for any worsening symptoms.

## 2024-03-11 NOTE — ED Provider Notes (Signed)
 [place] EMERGENCY DEPARTMENT AT [facility] Provider Note   CSN: [id number] Arrival date & time: 03/11/24  1625     History No chief complaint on file.   Tyrone Mitchell is a 83 y.o. male patient with history of atrial fibrillation on Xarelto , diabetes, hyperlipidemia who presents to the emergency department today for further evaluation of productive cough and mild shortness of breath.  His symptoms started on Tuesday.  He reports associated sore throat and some reflux symptoms.  He states he is having a very productive cough with a lot of white phlegm.  Shortness of breath is primarily with exertion.  Also complaining of chest tightness when coughing.  He denies any nausea, vomiting, diarrhea, abdominal pain, fever, chills.  HPI     Home Medications Prior to Admission medications   Medication Sig Start Date End Date Taking? Authorizing Provider  benzonatate (TESSALON) 100 MG capsule Take 1 capsule (100 mg total) by mouth every 8 (eight) hours. 03/11/24  Yes Tyrone Mitchell, Timmia Cogburn M, PA-C  acetaminophen  (TYLENOL ) 500 MG tablet Take 500 mg by mouth 3 (three) times daily.    [provider]  ARTIFICIAL TEAR SOLUTION OP Place 1 drop into both eyes daily.    [provider]  diltiazem (CARDIZEM CD) 180 MG 24 hr capsule Take 180 mg by mouth daily. 12/06/22   [provider]  GLIPIZIDE XL 10 MG 24 hr tablet Take 10 mg by mouth 2 (two) times daily. 09/06/16   [provider]  insulin  degludec (TRESIBA) 100 UNIT/ML FlexTouch Pen Inject 25 Units into the skin every evening. 10/15/20   [provider]  levocetirizine (XYZAL) 5 MG tablet Take 1 tablet by mouth every evening. 08/11/20   [provider]  metoprolol  tartrate (LOPRESSOR ) 25 MG tablet TAKE TWO TABLETS EVERY MORNING AND ONE TABLET IN THE EVENING. 02/01/23   Tyrone Fall, MD  pantoprazole  (PROTONIX ) 40 MG tablet Take 40 mg by mouth daily.  05/09/18   [provider]   psyllium (METAMUCIL SMOOTH TEXTURE) 28 % packet Take 1 packet by mouth 2 (two) times daily.    [provider]  rivaroxaban  (XARELTO ) 20 MG TABS tablet Take 20 mg by mouth daily. 12/26/13   Tyrone Fall, MD  simvastatin  (ZOCOR ) 20 MG tablet Take 20 mg by mouth daily.  07/21/15   [provider]  TRADJENTA 5 MG TABS tablet Take 5 mg by mouth daily. 10/12/16   [provider]  traMADol  (ULTRAM ) 50 MG tablet Take 50 mg by mouth every 6 (six) hours as needed for pain. 08/11/20   [provider]      Allergies    Penicillins    Review of Systems   Review of Systems  Physical Exam Updated Vital Signs BP (!) 142/96 (BP Location: Right Arm)   Pulse 82   Temp 98 F (36.7 C) (Oral)   Resp 18   SpO2 98%  Physical Exam Vitals and nursing note reviewed.  Constitutional:      General: He is not in acute distress.    Appearance: Normal appearance.  HENT:     Head: Normocephalic and atraumatic.  Eyes:     General:        Right eye: No discharge.        Left eye: No discharge.  Cardiovascular:     Comments: Regular rate and rhythm.  S1/S2 are distinct without any evidence of murmur, rubs, or gallops.  Radial pulses are 2+ bilaterally.  Dorsalis pedis pulses are 2+ bilaterally.  No evidence of pedal edema. Pulmonary:     Comments: Decreased breath sounds heard throughout but worse in the right lower lung base.  There is some exertional wheezing and some rhonchi. Abdominal:     General: Abdomen is flat. Bowel sounds are normal. There is no distension.     Tenderness: There is no abdominal tenderness. There is no guarding or rebound.  Musculoskeletal:        General: Normal range of motion.     Cervical back: Neck supple.  Skin:    General: Skin is warm and dry.     Findings: No rash.  Neurological:     General: No focal deficit present.     Mental Status: He is alert.  Psychiatric:        Mood and Affect: Mood normal.        Behavior: Behavior  normal.     ED Results / Procedures / Treatments   Labs (all labs ordered are listed, but only abnormal results are displayed) Labs Reviewed  CBC WITH DIFFERENTIAL/PLATELET - Abnormal; Notable for the following components:      Result Value   RDW 19.0 (*)    Platelets 118 (*)    All other components within normal limits  BASIC METABOLIC PANEL WITH GFR - Abnormal; Notable for the following components:   Glucose, Bld 197 (*)    All other components within normal limits  RESP PANEL BY RT-PCR (RSV, FLU A&B, COVID)  RVPGX2    EKG None  Radiology DG Chest 2 View Result Date: 03/11/2024 CLINICAL DATA:  Productive cough.  Sore throat and congestion. EXAM: CHEST - 2 VIEW COMPARISON:  02/16/2024 FINDINGS: Heart size and pulmonary vascularity are normal. Peribronchial thickening and central interstitial pattern to the lungs likely representing acute or chronic bronchitis. No airspace disease or consolidation in the lungs. No pleural effusion or pneumothorax. Mediastinal contours appear intact. Calcification of the aorta. Old rib fractures. IMPRESSION: Peribronchial thickening with streaky perihilar interstitial changes likely representing airways disease, acute or chronic bronchitis. No focal consolidation. Electronically Signed   By: Boyce Byes M.D.   On: 03/11/2024 18:00    Procedures Procedures    Medications Ordered in ED Medications - No data to display  ED Course/ Medical Decision Making/ A&P   {   Click here for ABCD2, HEART and other calculators  Medical Decision Making Tyrone Mitchell is a 83 y.o. male patient who presents to the emergency department today for further evaluation of cough.  No evidence of COPD exacerbation as the patient does not have a COPD history.  He is a previous smoker.  Chest x-ray did not reveal any clinical signs of pneumonia.  No white count.  Patient is afebrile.  He is otherwise oxygenating well on room air.  Will hold off on steroids given the  patient's history of diabetes and he is currently improving.  I will give the patient some Tessalon Perles for cough.  I will have him follow-up with his primary care doctor.  He is safe for discharge at this time.   Amount and/or Complexity of Data Reviewed Labs: ordered. Radiology: ordered.  Risk Prescription drug management.    Final Clinical Impression(s) / ED Diagnoses Final diagnoses:  Acute cough    Rx / DC Orders ED Discharge Orders          Ordered    benzonatate (TESSALON) 100 MG capsule  Every 8 hours  03/11/24 1832              Darletta Ehrich, PA-C 03/11/24 1834    Sallyanne Creamer, DO 03/13/24 2029

## 2024-03-11 NOTE — ED Triage Notes (Signed)
 Started Tuesday Cough, sore throat congestion, GERD Some improvement today, but coughing up a lot of phlegm

## 2024-04-30 ENCOUNTER — Other Ambulatory Visit: Payer: Self-pay | Admitting: Internal Medicine

## 2024-05-31 ENCOUNTER — Other Ambulatory Visit: Payer: Self-pay | Admitting: Internal Medicine

## 2024-06-09 ENCOUNTER — Other Ambulatory Visit: Payer: Self-pay | Admitting: Internal Medicine
